# Patient Record
Sex: Female | Born: 1950 | Race: White | Hispanic: No | Marital: Married | Smoking: Former smoker
Health system: Southern US, Community
[De-identification: ages and names within clinical notes are randomized; demographics above are authoritative.]

## PROBLEM LIST (undated history)

## (undated) DIAGNOSIS — F419 Anxiety disorder, unspecified: Secondary | ICD-10-CM

## (undated) DIAGNOSIS — I10 Essential (primary) hypertension: Secondary | ICD-10-CM

## (undated) DIAGNOSIS — Z8659 Personal history of other mental and behavioral disorders: Secondary | ICD-10-CM

## (undated) DIAGNOSIS — K759 Inflammatory liver disease, unspecified: Secondary | ICD-10-CM

## (undated) DIAGNOSIS — J189 Pneumonia, unspecified organism: Secondary | ICD-10-CM

## (undated) DIAGNOSIS — G479 Sleep disorder, unspecified: Secondary | ICD-10-CM

## (undated) DIAGNOSIS — K769 Liver disease, unspecified: Secondary | ICD-10-CM

## (undated) DIAGNOSIS — L409 Psoriasis, unspecified: Secondary | ICD-10-CM

## (undated) DIAGNOSIS — J439 Emphysema, unspecified: Secondary | ICD-10-CM

## (undated) HISTORY — DX: Essential (primary) hypertension: I10

## (undated) HISTORY — PX: WISDOM TOOTH EXTRACTION: SHX21

## (undated) HISTORY — PX: ABDOMINAL HYSTERECTOMY: SHX81

---

## 1998-06-19 ENCOUNTER — Emergency Department (HOSPITAL_COMMUNITY): Admission: EM | Admit: 1998-06-19 | Discharge: 1998-06-19 | Payer: Self-pay | Admitting: Emergency Medicine

## 1998-06-19 ENCOUNTER — Encounter: Payer: Self-pay | Admitting: Emergency Medicine

## 1999-01-05 ENCOUNTER — Other Ambulatory Visit: Admission: RE | Admit: 1999-01-05 | Discharge: 1999-01-05 | Payer: Self-pay | Admitting: Family Medicine

## 2000-05-21 ENCOUNTER — Encounter: Admission: RE | Admit: 2000-05-21 | Discharge: 2000-05-23 | Payer: Self-pay | Admitting: *Deleted

## 2001-02-04 ENCOUNTER — Emergency Department (HOSPITAL_COMMUNITY): Admission: EM | Admit: 2001-02-04 | Discharge: 2001-02-04 | Payer: Self-pay | Admitting: Emergency Medicine

## 2004-11-11 ENCOUNTER — Ambulatory Visit: Payer: Self-pay | Admitting: Family Medicine

## 2004-11-17 ENCOUNTER — Encounter: Admission: RE | Admit: 2004-11-17 | Discharge: 2004-11-17 | Payer: Self-pay | Admitting: Family Medicine

## 2004-11-25 ENCOUNTER — Ambulatory Visit: Payer: Self-pay | Admitting: Family Medicine

## 2005-01-18 ENCOUNTER — Ambulatory Visit: Payer: Self-pay | Admitting: Gastroenterology

## 2005-04-29 ENCOUNTER — Ambulatory Visit: Payer: Self-pay | Admitting: Family Medicine

## 2007-08-30 HISTORY — PX: COLONOSCOPY: SHX174

## 2011-03-30 HISTORY — PX: LUNG LOBECTOMY: SHX167

## 2013-08-19 ENCOUNTER — Encounter: Payer: Self-pay | Admitting: Obstetrics and Gynecology

## 2013-08-19 ENCOUNTER — Ambulatory Visit (INDEPENDENT_AMBULATORY_CARE_PROVIDER_SITE_OTHER): Payer: BC Managed Care – PPO | Admitting: Obstetrics and Gynecology

## 2013-08-19 VITALS — BP 144/100 | Ht 61.5 in | Wt 200.8 lb

## 2013-08-19 DIAGNOSIS — N898 Other specified noninflammatory disorders of vagina: Secondary | ICD-10-CM

## 2013-08-19 LAB — POCT WET PREP (WET MOUNT): Trichomonas Wet Prep HPF POC: NEGATIVE

## 2013-08-19 MED ORDER — ESTROGENS, CONJUGATED 0.625 MG/GM VA CREA: 1.0000 | TOPICAL_CREAM | Freq: Every day | VAGINAL | Status: DC

## 2013-08-19 MED ORDER — CLOBETASOL PROPIONATE 0.05 % EX SOLN: 1.0000 "application " | Freq: Two times a day (BID) | CUTANEOUS | Status: DC

## 2013-08-19 NOTE — Progress Notes (Signed)
Patient ID: Raven Lee, female   DOB: 07-01-1951, 62 y.o.   MRN: [id number]   [facility] Visit  Patient name: Raven Lee MRN [id number]  Date of birth: 09/20/1950  CC & HPI:  Raven Lee is a 62 y.o. female presenting today for recurrent Body odor tx'd with Temovated.. Ist visit in EPIC/[facility]    ROS:  Hx vulvar bx for + lichen sclerosis  Pertinent History Reviewed:  Medical & Surgical Hx:  Reviewed: Significant for hyst yrs ago Medications: Reviewed & Updated - see associated section Social History: Reviewed -  reports that she has quit smoking. She has never used smokeless tobacco.  Objective Findings:  Vitals: BP 144/100  Ht 5' 1.5" (1.562 m)  Wt 200 lb 12.8 oz (91.082 kg)  BMI 37.33 kg/m2  Physical Examination: General appearance - alert, well appearing, and in no distress, oriented to person, place, and time and overweight Abdomen - soft, nontender, nondistended, no masses or organomegaly Pelvic - vulvar excoriation LS&A Vag  Atrophic   Assessment & Plan:   LS&A Atrophic vaginitis P renew temovate,  Rx premarinVC 2x/wk

## 2014-07-04 ENCOUNTER — Encounter: Payer: Self-pay | Admitting: Internal Medicine

## 2014-07-04 ENCOUNTER — Ambulatory Visit (INDEPENDENT_AMBULATORY_CARE_PROVIDER_SITE_OTHER): Payer: BC Managed Care – PPO | Admitting: Internal Medicine

## 2014-07-04 ENCOUNTER — Encounter (INDEPENDENT_AMBULATORY_CARE_PROVIDER_SITE_OTHER): Payer: Self-pay

## 2014-07-04 ENCOUNTER — Other Ambulatory Visit (INDEPENDENT_AMBULATORY_CARE_PROVIDER_SITE_OTHER): Payer: BC Managed Care – PPO

## 2014-07-04 VITALS — BP 150/90 | HR 73 | Ht 63.0 in | Wt 210.0 lb

## 2014-07-04 DIAGNOSIS — J449 Chronic obstructive pulmonary disease, unspecified: Secondary | ICD-10-CM

## 2014-07-04 DIAGNOSIS — R0600 Dyspnea, unspecified: Secondary | ICD-10-CM

## 2014-07-04 LAB — CBC WITH DIFFERENTIAL/PLATELET
BASOS ABS: 0.1 10*3/uL (ref 0.0–0.1)
Basophils Relative: 1.8 % (ref 0.0–3.0)
EOS ABS: 0 10*3/uL (ref 0.0–0.7)
Eosinophils Relative: 0 % (ref 0.0–5.0)
HCT: 43.9 % (ref 36.0–46.0)
Hemoglobin: 14.8 g/dL (ref 12.0–15.0)
Lymphocytes Relative: 40 % (ref 12.0–46.0)
Lymphs Abs: 2.4 10*3/uL (ref 0.7–4.0)
MCHC: 33.7 g/dL (ref 30.0–36.0)
MCV: 94.7 fl (ref 78.0–100.0)
Monocytes Absolute: 0.5 10*3/uL (ref 0.1–1.0)
Monocytes Relative: 8.6 % (ref 3.0–12.0)
NEUTROS ABS: 3 10*3/uL (ref 1.4–7.7)
Neutrophils Relative %: 49.6 % (ref 43.0–77.0)
Platelets: 174 10*3/uL (ref 150.0–400.0)
RBC: 4.63 Mil/uL (ref 3.87–5.11)
RDW: 14.6 % (ref 11.5–15.5)
WBC: 6.1 10*3/uL (ref 4.0–10.5)

## 2014-07-04 LAB — BASIC METABOLIC PANEL
BUN: 9 mg/dL (ref 6–23)
CHLORIDE: 105 meq/L (ref 96–112)
CO2: 25 mEq/L (ref 19–32)
Calcium: 9.2 mg/dL (ref 8.4–10.5)
Creatinine, Ser: 0.9 mg/dL (ref 0.4–1.2)
GFR: 68.21 mL/min (ref >60.00)
Glucose, Bld: 104 mg/dL — ABNORMAL HIGH (ref 70–99)
POTASSIUM: 3.6 meq/L (ref 3.5–5.1)
SODIUM: 141 meq/L (ref 135–145)

## 2014-07-04 LAB — TSH: TSH: 2.42 u[IU]/mL (ref 0.35–4.50)

## 2014-07-04 LAB — BRAIN NATRIURETIC PEPTIDE: Pro B Natriuretic peptide (BNP): 51 pg/mL (ref 0.0–100.0)

## 2014-07-04 NOTE — Patient Instructions (Addendum)
Ok to try allegra or clariton for your allergies  If you start having cough or throat congestion/ wheezing the first step is to change your quinapril to an alternative  Please remember to go to the lab and x-ray department downstairs for your tests - we will call you with the results when they are available.  We will be referring you to pulmonary rehab at [facility] - someone should contact you next week for this

## 2014-07-04 NOTE — Progress Notes (Signed)
Quick Note:  Spoke with pt and notified of results per Dr. Wert. Pt verbalized understanding and denied any questions.  ______ 

## 2014-07-04 NOTE — Progress Notes (Signed)
Subjective:    Patient ID: Raven Lee, female    DOB: 08-21-1951,   MRN: [id number]  HPI  70 yowf quit 2012 p LLLobectomy for pna at [facility] with persistant sob did not require 02 and inhalers but not using them seeing primary doc in [employer] and interested in refereral to rehab at [facility] so self referred 07/04/2014 to pulmonary clinic.  07/04/2014 1st [place] [facility] office visit/ Wert   Chief Complaint  Patient presents with  . Pulmonary Consult    Self referral. Pt states dxed with COPD in 2012. She had left lower lobectomy in Aug 2012 to remove "mass of pneumonia".  She c/o SOB since then.  She gets SOB with walking from her house to her barn.     indolent onset doe x years as  gained 70 lbs with gradual decline in ex tol to point where can still walk up to 5 min slow and flat but no inclines  No am cough  Chronic leg swellling R > L eval in [place] with neg dopplers in 2014 Some nasal congestion / stuffiness year round chronically but no throat complaints on chronic acei Has not tried inhalers  In term of doe, No obvious day to day or daytime variabilty or assoc chronic cough or cp or chest tightness, subjective wheeze or overt   hb symptoms. No unusual exp hx or h/o childhood pna/ asthma or knowledge of premature birth.  Sleeping ok without nocturnal  or early am exacerbation  of respiratory  c/o's or need for noct saba. Also denies any obvious fluctuation of symptoms with weather or environmental changes or other aggravating or alleviating factors except as outlined above   Current Medications, Allergies, Complete Past Medical History, Past Surgical History, Family History, and Social History were reviewed in [employer] record.             Review of Systems  Constitutional: Negative for fever, chills and unexpected weight change.  HENT: Positive for congestion. Negative for dental problem, ear pain, nosebleeds, postnasal drip, rhinorrhea, sinus pressure,  sneezing, sore throat, trouble swallowing and voice change.   Eyes: Negative for visual disturbance.  Respiratory: Negative for cough, choking and shortness of breath.   Cardiovascular: Positive for leg swelling. Negative for chest pain.  Gastrointestinal: Negative for vomiting, abdominal pain and diarrhea.  Genitourinary: Negative for difficulty urinating.  Musculoskeletal: Positive for arthralgias.  Neurological: Negative for tremors, syncope and headaches.  Hematological: Does not bruise/bleed easily.       Objective:   Physical Exam  amb obese wf nad  Wt Readings from Last 3 Encounters:  07/04/14 210 lb (95.255 kg)  08/19/13 200 lb 12.8 oz (91.082 kg)    Vital signs reviewed  HEENT: nl dentition, turbinates, and orophanx. Nl external ear canals without cough reflex   NECK :  without JVD/Nodes/TM/ nl carotid upstrokes bilaterally   LUNGS: no acc muscle use, clear to A and P bilaterally without cough on insp or exp maneuvers   CV:  RRR  no s3 or murmur or increase in P2, no edema   ABD:  soft and nontender with nl excursion in the supine position. No bruits or organomegaly, bowel sounds nl  MS:  warm without deformities, calf tenderness, cyanosis or clubbing  SKIN: warm and dry without lesions    NEURO:  alert, approp, no deficits    CXR  07/04/2014 : Did not go     Recent Labs Lab 07/04/14 1159  NA 141  K 3.6  CL 105  CO2 25  BUN 9  CREATININE 0.9  GLUCOSE 104*    Recent Labs Lab 07/04/14 1159  HGB 14.8  HCT 43.9  WBC 6.1  PLT 174.0     Lab Results  Component Value Date   TSH 2.42 07/04/2014     Lab Results  Component Value Date   PROBNP 51.0 07/04/2014        Assessment & Plan:

## 2014-07-05 DIAGNOSIS — J449 Chronic obstructive pulmonary disease, unspecified: Secondary | ICD-10-CM | POA: Insufficient documentation

## 2014-07-05 NOTE — Assessment & Plan Note (Signed)
C/w  Obesity/ s/p R LLobectomy, and mild copd  With bnp << 100 unlikely to have cardiac component and no further w/u needed at this point other than to complete a cxr which as requested but not done.

## 2014-07-05 NOTE — Assessment & Plan Note (Addendum)
-   spirometry 07/04/14 FEV1  1.24 (54%) ratio 64  - Referred to rehab at [facility] 07/04/14    When respiratory symptoms begin or become refractory well after a patient reports complete smoking cessation,  Especially when this wasn't the case while they were smoking, a red flag is raised based on the work of Dr Kris Mouton which states:  if you quit smoking when your best day FEV1 is still well preserved it is highly unlikely you will progress to severe disease.  That is to say, once the smoking stops,  the symptoms should not suddenly erupt or markedly worsen.  If so, the differential diagnosis should include  obesity/deconditioning,  LPR/Reflux/Aspiration syndromes,  occult CHF, or  especially side effect of medications commonly used in this population, like ACEi  Most likely this is result of obesity/ deconditioning over time though I would have very low threshold to d/c ACEi and rec she go ahead with rehab at this point  At this point the airflow obst is so mild and chronic s variation that  I don't believe using bronchodilators will help

## 2014-07-08 ENCOUNTER — Telehealth: Payer: Self-pay | Admitting: *Deleted

## 2014-07-08 NOTE — Telephone Encounter (Signed)
-----   Message from Tanda Rockers, MD sent at 07/05/2014  7:30 AM EST ----- Apparently she did not go for cxr which will need to be done before rehab

## 2014-07-08 NOTE — Telephone Encounter (Signed)
Pt returning call.Raven Lee ° °

## 2014-07-08 NOTE — Telephone Encounter (Signed)
LMTCB

## 2014-07-09 NOTE — Telephone Encounter (Signed)
[phone] returning call

## 2014-07-10 NOTE — Telephone Encounter (Signed)
Called and spoke with the pt and notified needs to come back for cxr  She verbalized understanding  Will call with results once received

## 2014-07-14 ENCOUNTER — Ambulatory Visit (INDEPENDENT_AMBULATORY_CARE_PROVIDER_SITE_OTHER)
Admission: RE | Admit: 2014-07-14 | Discharge: 2014-07-14 | Disposition: A | Discharge status: Home / Self Care | Payer: BC Managed Care – PPO | Source: Ambulatory Visit | Attending: Internal Medicine | Admitting: Internal Medicine

## 2014-07-14 DIAGNOSIS — R0600 Dyspnea, unspecified: Secondary | ICD-10-CM

## 2014-07-15 ENCOUNTER — Telehealth: Payer: Self-pay | Admitting: Internal Medicine

## 2014-07-15 NOTE — Progress Notes (Signed)
Quick Note:  LMTCB ______ 

## 2014-07-15 NOTE — Progress Notes (Signed)
Quick Note:  Spoke with pt and notified of results per Dr. Wert. Pt verbalized understanding and denied any questions.  ______ 

## 2014-07-15 NOTE — Telephone Encounter (Signed)
lmtcb for pt.   Notes Recorded by Tanda Rockers, MD on 07/14/2014 at 7:17 PM Call pt: Reviewed cxr and no acute change so no change in recommendations made at [facility]

## 2014-07-16 NOTE — Telephone Encounter (Signed)
Called spoke with patient, advised of cxr results/recs as stated by MW.  Pt voiced her understanding.  Nothing further needed; will sign off.

## 2014-08-07 ENCOUNTER — Encounter (HOSPITAL_COMMUNITY): Payer: BC Managed Care – PPO

## 2014-08-14 ENCOUNTER — Encounter (HOSPITAL_COMMUNITY): Payer: Self-pay

## 2014-08-14 ENCOUNTER — Encounter (HOSPITAL_COMMUNITY)
Admission: RE | Admit: 2014-08-14 | Discharge: 2014-08-14 | Disposition: A | Discharge status: Home / Self Care | Payer: BLUE CROSS/BLUE SHIELD | Source: Ambulatory Visit | Attending: Internal Medicine | Admitting: Internal Medicine

## 2014-08-14 VITALS — BP 128/82 | HR 71 | Ht 61.0 in | Wt 205.7 lb

## 2014-08-14 DIAGNOSIS — J438 Other emphysema: Secondary | ICD-10-CM

## 2014-08-14 DIAGNOSIS — J439 Emphysema, unspecified: Secondary | ICD-10-CM | POA: Insufficient documentation

## 2014-08-14 HISTORY — DX: Emphysema, unspecified: J43.9

## 2014-08-14 NOTE — Progress Notes (Signed)
Pt has finished orientation and is scheduled to start CR on 08/26/14 at 1330. Pt has been instructed to arrive to class 15 minutes early for scheduled class. Pt has been instructed to wear comfortable clothing and shoes with rubber soles. Pt has been told to take their medications 1 hour prior to coming to class.  If the patient is not going to attend class, he/she has been instructed to call.

## 2014-08-14 NOTE — Progress Notes (Signed)
Patient referred to Pulmonary Rehab by Dr. 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  Dr. Melvyn Novas is her Pulmonologist and Dr. Narda Bonds is her PCP.  During orientation advised patient on arrival and appointment times what to wear, what to do before, during and after exercise.  Reviewed attendance and class policy.  Talked about inclement weather and class consultation policy. Patient is scheduled to start Pulmonary Rehab on 08/26/14 at 1330. Patient was advised to come to class 5 minutes before class starts.  He was also given instructions on meeting with the dietician and attending the Family Structure classes. Pt is eager to get started. Pt was able to complete six minute walk test.  Discussed and reviewed pursed lip breathing technique and inhaler use and pt was able to correctly demonstrate.

## 2014-08-26 ENCOUNTER — Encounter (HOSPITAL_COMMUNITY)
Admission: RE | Admit: 2014-08-26 | Discharge: 2014-08-26 | Disposition: A | Discharge status: Home / Self Care | Payer: BLUE CROSS/BLUE SHIELD | Source: Ambulatory Visit | Attending: Internal Medicine | Admitting: Internal Medicine

## 2014-08-26 DIAGNOSIS — J439 Emphysema, unspecified: Secondary | ICD-10-CM | POA: Diagnosis not present

## 2014-08-28 ENCOUNTER — Encounter (HOSPITAL_COMMUNITY)
Admission: RE | Admit: 2014-08-28 | Discharge: 2014-08-28 | Disposition: A | Discharge status: Home / Self Care | Payer: BLUE CROSS/BLUE SHIELD | Source: Ambulatory Visit | Attending: Internal Medicine | Admitting: Internal Medicine

## 2014-08-28 DIAGNOSIS — J439 Emphysema, unspecified: Secondary | ICD-10-CM | POA: Diagnosis not present

## 2014-08-28 NOTE — Progress Notes (Signed)
Pulmonary Rehabilitation Program Outcomes Report   Orientation:  08/14/14 Graduate Date:  tbd Discharge Date:  tbd # of sessions completed: 2  Pulmonologist: Dr. Melvyn Novas Family MD:  Dr. Sharon Seller Time:  1330  A.  Exercise Program:  Tolerates exercise @ 3.63 METS for 30 minutes  B.  Mental Health:  Good mental attitude  C.  Education/Instruction/Skills  Accurately checks own pulse.  Rest:  79  Exercise:  91, Knows THR for exercise and Uses Perceived Exertion Scale and/or Dyspnea Scale  Uses Perceived Exertion Scale and/or Dyspnea Scale  D.  Nutrition/Weight Control/Body Composition:  Adherence to prescribed nutrition program: good    E.  Blood Lipids   No results found for: CHOL, HDL, LDLCALC, LDLDIRECT, TRIG, CHOLHDL  F.  Lifestyle Changes:  Making positive lifestyle changes and Not smoking:  Quit 2012  G.  Symptoms noted with exercise:  Asymptomatic  Report Completed By:  Felicity Coyer, RN   Comments:  First week progress note.  Pt is exhibiting a very positive attitude during first week.

## 2014-09-02 ENCOUNTER — Encounter (HOSPITAL_COMMUNITY)
Admission: RE | Admit: 2014-09-02 | Discharge: 2014-09-02 | Disposition: A | Discharge status: Home / Self Care | Payer: BLUE CROSS/BLUE SHIELD | Source: Ambulatory Visit | Attending: Internal Medicine | Admitting: Internal Medicine

## 2014-09-02 DIAGNOSIS — J439 Emphysema, unspecified: Secondary | ICD-10-CM | POA: Diagnosis present

## 2014-09-03 ENCOUNTER — Other Ambulatory Visit: Payer: BC Managed Care – PPO | Admitting: Obstetrics and Gynecology

## 2014-09-04 ENCOUNTER — Encounter (HOSPITAL_COMMUNITY): Payer: BLUE CROSS/BLUE SHIELD

## 2014-09-09 ENCOUNTER — Encounter (HOSPITAL_COMMUNITY)
Admission: RE | Admit: 2014-09-09 | Discharge: 2014-09-09 | Disposition: A | Discharge status: Home / Self Care | Payer: BLUE CROSS/BLUE SHIELD | Source: Ambulatory Visit | Attending: Internal Medicine | Admitting: Internal Medicine

## 2014-09-09 DIAGNOSIS — J439 Emphysema, unspecified: Secondary | ICD-10-CM | POA: Diagnosis not present

## 2014-09-11 ENCOUNTER — Encounter (HOSPITAL_COMMUNITY)
Admission: RE | Admit: 2014-09-11 | Discharge: 2014-09-11 | Disposition: A | Discharge status: Home / Self Care | Payer: BLUE CROSS/BLUE SHIELD | Source: Ambulatory Visit | Attending: Internal Medicine | Admitting: Internal Medicine

## 2014-09-11 DIAGNOSIS — J439 Emphysema, unspecified: Secondary | ICD-10-CM | POA: Diagnosis not present

## 2014-09-12 ENCOUNTER — Other Ambulatory Visit: Payer: Self-pay | Admitting: General Surgery

## 2014-09-13 LAB — COMPREHENSIVE METABOLIC PANEL
ALBUMIN: 3.5 g/dL (ref 3.5–5.2)
ALT: 61 U/L — ABNORMAL HIGH (ref 0–35)
AST: 122 U/L — AB (ref 0–37)
Alkaline Phosphatase: 97 U/L (ref 39–117)
BUN: 9 mg/dL (ref 6–23)
CO2: 22 mEq/L (ref 19–32)
Calcium: 9.3 mg/dL (ref 8.4–10.5)
Chloride: 103 mEq/L (ref 96–112)
Creat: 0.7 mg/dL (ref 0.50–1.10)
Glucose, Bld: 105 mg/dL — ABNORMAL HIGH (ref 70–99)
POTASSIUM: 3.4 meq/L — AB (ref 3.5–5.3)
Sodium: 142 mEq/L (ref 135–145)
Total Bilirubin: 1.3 mg/dL — ABNORMAL HIGH (ref 0.2–1.2)
Total Protein: 7.9 g/dL (ref 6.0–8.3)

## 2014-09-13 LAB — CBC WITH DIFFERENTIAL/PLATELET
Basophils Absolute: 0 10*3/uL (ref 0.0–0.1)
Basophils Relative: 0 % (ref 0–1)
Eosinophils Absolute: 0 10*3/uL (ref 0.0–0.7)
Eosinophils Relative: 0 % (ref 0–5)
HEMATOCRIT: 41.8 % (ref 36.0–46.0)
Hemoglobin: 14.2 g/dL (ref 12.0–15.0)
LYMPHS PCT: 36 % (ref 12–46)
Lymphs Abs: 1.9 10*3/uL (ref 0.7–4.0)
MCH: 31.7 pg (ref 26.0–34.0)
MCHC: 34 g/dL (ref 30.0–36.0)
MCV: 93.3 fL (ref 78.0–100.0)
MONO ABS: 0.6 10*3/uL (ref 0.1–1.0)
MPV: 9.6 fL (ref 8.6–12.4)
Monocytes Relative: 11 % (ref 3–12)
NEUTROS ABS: 2.9 10*3/uL (ref 1.7–7.7)
Neutrophils Relative %: 53 % (ref 43–77)
PLATELETS: 148 10*3/uL — AB (ref 150–400)
RBC: 4.48 MIL/uL (ref 3.87–5.11)
RDW: 14.9 % (ref 11.5–15.5)
WBC: 5.4 10*3/uL (ref 4.0–10.5)

## 2014-09-13 LAB — URINALYSIS, ROUTINE W REFLEX MICROSCOPIC
Glucose, UA: NEGATIVE mg/dL
Hgb urine dipstick: NEGATIVE
NITRITE: NEGATIVE
PH: 6 (ref 5.0–8.0)
Protein, ur: 30 mg/dL — AB
Specific Gravity, Urine: 1.03 (ref 1.005–1.030)
Urobilinogen, UA: 1 mg/dL (ref 0.0–1.0)

## 2014-09-13 LAB — URINALYSIS, MICROSCOPIC ONLY
CASTS: NONE SEEN
CRYSTALS: NONE SEEN

## 2014-09-13 LAB — LIPID PANEL
Cholesterol: 154 mg/dL (ref 0–200)
HDL: 41 mg/dL (ref >39)
LDL CALC: 84 mg/dL (ref 0–99)
TRIGLYCERIDES: 146 mg/dL (ref <150)
Total CHOL/HDL Ratio: 3.8 Ratio
VLDL: 29 mg/dL (ref 0–40)

## 2014-09-13 LAB — FOLATE: Folate: 18.8 ng/mL

## 2014-09-13 LAB — VITAMIN B12: Vitamin B-12: 510 pg/mL (ref 211–911)

## 2014-09-13 LAB — TSH: TSH: 3.086 u[IU]/mL (ref 0.350–4.500)

## 2014-09-13 LAB — IRON: IRON: 252 ug/dL — AB (ref 42–145)

## 2014-09-13 LAB — T4, FREE: FREE T4: 0.82 ng/dL (ref 0.80–1.80)

## 2014-09-15 ENCOUNTER — Other Ambulatory Visit (INDEPENDENT_AMBULATORY_CARE_PROVIDER_SITE_OTHER): Payer: Self-pay

## 2014-09-16 ENCOUNTER — Encounter (HOSPITAL_COMMUNITY)
Admission: RE | Admit: 2014-09-16 | Discharge: 2014-09-16 | Disposition: A | Discharge status: Home / Self Care | Payer: BLUE CROSS/BLUE SHIELD | Source: Ambulatory Visit | Attending: Internal Medicine | Admitting: Internal Medicine

## 2014-09-16 DIAGNOSIS — J439 Emphysema, unspecified: Secondary | ICD-10-CM | POA: Diagnosis not present

## 2014-09-17 LAB — VITAMIN D 1,25 DIHYDROXY
VITAMIN D 1, 25 (OH) TOTAL: 64 pg/mL (ref 18–72)
Vitamin D2 1, 25 (OH)2: <8 pg/mL
Vitamin D3 1, 25 (OH)2: 64 pg/mL

## 2014-09-18 ENCOUNTER — Encounter (HOSPITAL_COMMUNITY): Payer: BLUE CROSS/BLUE SHIELD

## 2014-09-23 ENCOUNTER — Encounter (HOSPITAL_COMMUNITY)
Admission: RE | Admit: 2014-09-23 | Discharge: 2014-09-23 | Disposition: A | Discharge status: Home / Self Care | Payer: BLUE CROSS/BLUE SHIELD | Source: Ambulatory Visit | Attending: Internal Medicine | Admitting: Internal Medicine

## 2014-09-23 DIAGNOSIS — J439 Emphysema, unspecified: Secondary | ICD-10-CM | POA: Diagnosis not present

## 2014-09-25 ENCOUNTER — Encounter (HOSPITAL_COMMUNITY): Payer: BLUE CROSS/BLUE SHIELD

## 2014-09-30 ENCOUNTER — Encounter (HOSPITAL_COMMUNITY): Payer: BLUE CROSS/BLUE SHIELD

## 2014-10-01 ENCOUNTER — Other Ambulatory Visit (INDEPENDENT_AMBULATORY_CARE_PROVIDER_SITE_OTHER): Payer: Self-pay

## 2014-10-02 ENCOUNTER — Encounter (HOSPITAL_COMMUNITY)
Admission: RE | Admit: 2014-10-02 | Discharge: 2014-10-02 | Disposition: A | Discharge status: Home / Self Care | Payer: BLUE CROSS/BLUE SHIELD | Source: Ambulatory Visit | Attending: Internal Medicine | Admitting: Internal Medicine

## 2014-10-02 DIAGNOSIS — J439 Emphysema, unspecified: Secondary | ICD-10-CM | POA: Diagnosis present

## 2014-10-04 ENCOUNTER — Encounter: Payer: BLUE CROSS/BLUE SHIELD | Attending: General Surgery | Admitting: Dietician

## 2014-10-04 ENCOUNTER — Encounter: Payer: Self-pay | Admitting: Dietician

## 2014-10-04 DIAGNOSIS — Z6841 Body Mass Index (BMI) 40.0 and over, adult: Secondary | ICD-10-CM | POA: Diagnosis not present

## 2014-10-04 DIAGNOSIS — Z713 Dietary counseling and surveillance: Secondary | ICD-10-CM | POA: Insufficient documentation

## 2014-10-04 NOTE — Patient Instructions (Signed)

## 2014-10-04 NOTE — Progress Notes (Signed)
  Pre-Op Assessment Visit:  Pre-Operative RYGB Surgery  Medical Nutrition Therapy:  Appt start time: 0350   End time:  0938.  Patient was seen on 10/04/2014 for Pre-Operative Nutrition Assessment. Assessment and letter of approval faxed to [facility] Bariatric Surgery Program coordinator on 10/04/2014.   Preferred Learning Style:   No preference indicated   Learning Readiness:   Ready  Handouts given during visit include:  Pre-Op Goals Bariatric Surgery Protein Shakes   During the appointment today the following Pre-Op Goals were reviewed with the patient: Maintain or lose weight as instructed by your surgeon Make healthy food choices Begin to limit portion sizes Limited concentrated sugars and fried foods Keep fat/sugar in the single digits per serving on   food labels Practice CHEWING your food  (aim for 30 chews per bite or until applesauce consistency) Practice not drinking 15 minutes before, during, and 30 minutes after each meal/snack Avoid all carbonated beverages  Avoid/limit caffeinated beverages  Avoid all sugar-sweetened beverages Consume 3 meals per day; eat every 3-5 hours Make a list of non-food related activities Aim for 64-100 ounces of FLUID daily  Aim for at least 60-80 grams of PROTEIN daily Look for a liquid protein source that contain ?15 g protein and ?5 g carbohydrate  (ex: shakes, drinks, shots)  Patient-Centered Goals: Raven Lee would like to live a longer life and would like to be more active. Raven Lee feels 100% confident that she can do the pre op goals and feel they are very important.  Demonstrated degree of understanding via:  Teach Back  Teaching Method Utilized:  Visual Auditory Hands on  Barriers to learning/adherence to lifestyle change: none  Patient to call the [facility] to enroll in Pre-Op and Post-Op Nutrition Education when surgery date is scheduled.

## 2014-10-07 ENCOUNTER — Encounter (HOSPITAL_COMMUNITY)
Admission: RE | Admit: 2014-10-07 | Discharge: 2014-10-07 | Disposition: A | Discharge status: Home / Self Care | Payer: BLUE CROSS/BLUE SHIELD | Source: Ambulatory Visit | Attending: Internal Medicine | Admitting: Internal Medicine

## 2014-10-07 DIAGNOSIS — J439 Emphysema, unspecified: Secondary | ICD-10-CM | POA: Diagnosis not present

## 2014-10-09 ENCOUNTER — Encounter (HOSPITAL_COMMUNITY): Payer: BLUE CROSS/BLUE SHIELD

## 2014-10-14 ENCOUNTER — Encounter (HOSPITAL_COMMUNITY): Payer: BLUE CROSS/BLUE SHIELD

## 2014-10-16 ENCOUNTER — Encounter (HOSPITAL_COMMUNITY): Payer: BLUE CROSS/BLUE SHIELD

## 2014-10-20 ENCOUNTER — Ambulatory Visit (HOSPITAL_COMMUNITY)
Admission: RE | Admit: 2014-10-20 | Discharge: 2014-10-20 | Disposition: A | Discharge status: Home / Self Care | Payer: BLUE CROSS/BLUE SHIELD | Source: Ambulatory Visit | Attending: General Surgery | Admitting: General Surgery

## 2014-10-20 DIAGNOSIS — Z87891 Personal history of nicotine dependence: Secondary | ICD-10-CM | POA: Insufficient documentation

## 2014-10-20 DIAGNOSIS — J449 Chronic obstructive pulmonary disease, unspecified: Secondary | ICD-10-CM | POA: Insufficient documentation

## 2014-10-20 DIAGNOSIS — Z6841 Body Mass Index (BMI) 40.0 and over, adult: Secondary | ICD-10-CM | POA: Diagnosis not present

## 2014-10-20 DIAGNOSIS — K224 Dyskinesia of esophagus: Secondary | ICD-10-CM | POA: Diagnosis not present

## 2014-10-21 ENCOUNTER — Encounter (HOSPITAL_COMMUNITY): Payer: BLUE CROSS/BLUE SHIELD

## 2014-10-23 ENCOUNTER — Encounter (HOSPITAL_COMMUNITY): Payer: BLUE CROSS/BLUE SHIELD

## 2014-10-23 NOTE — Progress Notes (Signed)
Patient is discharged from [facility] Pulmonary program today, October 23, 2014 with 9 sessions after calling stating she will not be returning to Pulmonary Rehab due to a change of living location.  She achieved LTG of 30 minutes of aerobic exercise at max met level of 3.65.  All patient vitals are WNL.   Patient had no complaints of any abnormal S/S or pain on their exit visit.

## 2014-10-28 ENCOUNTER — Encounter (HOSPITAL_COMMUNITY): Payer: BLUE CROSS/BLUE SHIELD

## 2014-10-30 ENCOUNTER — Encounter (HOSPITAL_COMMUNITY): Payer: BLUE CROSS/BLUE SHIELD

## 2014-11-04 ENCOUNTER — Encounter (HOSPITAL_COMMUNITY): Payer: BLUE CROSS/BLUE SHIELD

## 2014-11-06 ENCOUNTER — Encounter (HOSPITAL_COMMUNITY): Payer: BLUE CROSS/BLUE SHIELD

## 2014-11-10 ENCOUNTER — Encounter: Payer: BLUE CROSS/BLUE SHIELD | Attending: General Surgery

## 2014-11-10 DIAGNOSIS — Z6841 Body Mass Index (BMI) 40.0 and over, adult: Secondary | ICD-10-CM | POA: Insufficient documentation

## 2014-11-10 DIAGNOSIS — Z713 Dietary counseling and surveillance: Secondary | ICD-10-CM | POA: Insufficient documentation

## 2014-11-11 ENCOUNTER — Encounter (HOSPITAL_COMMUNITY): Payer: BLUE CROSS/BLUE SHIELD

## 2014-11-11 NOTE — Progress Notes (Signed)
  Pre-Operative Nutrition Class:  Appt start time: 1624   End time:  1830.  Patient was seen on 11/10/14 for Pre-Operative Bariatric Surgery Education at the [facility].   Surgery date:  Surgery type: RYGB Start weight at [facility]: 214 lbs on 10/04/14 Weight today: 201.5 lbs  TANITA  BODY COMP RESULTS  11/10/14   BMI (kg/m^2) 39.8   Fat Mass (lbs) 102   Fat Free Mass (lbs) 108.5   Total Body Water (lbs) 79.5   Samples given per MNT protocol. Patient educated on appropriate usage: Premier protein shake (strawberry - qty 1) Lot #: [id number] Exp: 04/2015  Unjury protein powder (unflavored - qty 1) Lot #: [id number] Exp: 09/2015  Bariatric Advantage Calcium citrate chews (orange - qty 1) Lot #: [id number] Exp: 12/2014  PB2 (qty 1) Lot #: [id number] Exp: 03/2015  The following the learning objectives were met by the patient during this course:  Identify Pre-Op Dietary Goals and will begin 2 weeks pre-operatively  Identify appropriate sources of fluids and proteins   State protein recommendations and appropriate sources pre and post-operatively  Identify Post-Operative Dietary Goals and will follow for 2 weeks post-operatively  Identify appropriate multivitamin and calcium sources  Describe the need for physical activity post-operatively and will follow MD recommendations  State when to call healthcare provider regarding medication questions or post-operative complications  Handouts given during class include:  Pre-Op Bariatric Surgery Diet Handout  Protein Shake Handout  Post-Op Bariatric Surgery Nutrition Handout  BELT Program Information Flyer  Support Group Information Flyer  [facility] Bariatric Supplements Price List  Follow-Up Plan: Patient will follow-up at [facility] 2 weeks post operatively for diet advancement per MD.

## 2014-11-13 ENCOUNTER — Encounter (HOSPITAL_COMMUNITY): Payer: BLUE CROSS/BLUE SHIELD

## 2014-11-19 ENCOUNTER — Other Ambulatory Visit: Payer: Self-pay | Admitting: General Surgery

## 2014-11-20 ENCOUNTER — Other Ambulatory Visit: Payer: Self-pay | Admitting: General Surgery

## 2014-11-20 NOTE — Progress Notes (Addendum)
Please put order in Epic for obtain consent surgery 12-01-14 pre op 11-26-14  Thanks

## 2014-11-25 NOTE — Patient Instructions (Addendum)
Raven Lee  11/25/2014   Your procedure is scheduled on: 12/01/14   Report to [facility] Main  Entrance and follow signs to               [place] at 8:30 AM.   Call this number if you have problems the morning of surgery [phone]   Remember:  Do not eat food or drink liquids :After Midnight.    Take these medicines the morning of surgery with A SIP OF WATER: NONE                                You may not have any metal on your body including hair pins and              piercings  Do not wear jewelry, make-up, lotions, powders or perfumes.             Do not wear nail polish.  Do not shave  48 hours prior to surgery.              Men may shave face and neck.   Do not bring valuables to the hospital. [place].  Contacts, dentures or bridgework may not be worn into surgery.  Leave suitcase in the car. After surgery it may be brought to your room.     Patients discharged the day of surgery will not be allowed to drive home.  Name and phone number of your driver:  Special Instructions: N/A              Please read over the following fact sheets you were given: _____________________________________________________________________                                                     [place]  Before surgery, you can play an important role.  Because skin is not sterile, your skin needs to be as free of germs as possible.  You can reduce the number of germs on your skin by washing with CHG (chlorahexidine gluconate) soap before surgery.  CHG is an antiseptic cleaner which kills germs and bonds with the skin to continue killing germs even after washing. Please DO NOT use if you have an allergy to CHG or antibacterial soaps.  If your skin becomes reddened/irritated stop using the CHG and inform your nurse when you arrive at Short Stay. Do not shave (including legs and  underarms) for at least 48 hours prior to the first CHG shower.  You may shave your face. Please follow these instructions carefully:   1.  Shower with CHG Soap the night before surgery and the  morning of Surgery.   2.  If you choose to wash your hair, wash your hair first as usual with your  normal  Shampoo.   3.  After you shampoo, rinse your hair and body thoroughly to remove the  shampoo.  4.  Use CHG as you would any other liquid soap.  You can apply chg directly  to the skin and wash . Gently wash with scrungie or clean wascloth    5.  Apply the CHG Soap to your body ONLY FROM THE NECK DOWN.   Do not use on open                           Wound or open sores. Avoid contact with eyes, ears mouth and genitals (private parts).                        Genitals (private parts) with your normal soap.              6.  Wash thoroughly, paying special attention to the area where your surgery  will be performed.   7.  Thoroughly rinse your body with warm water from the neck down.   8.  DO NOT shower/wash with your normal soap after using and rinsing off  the CHG Soap .                9.  Pat yourself dry with a clean towel.             10.  Wear clean pajamas.             11.  Place clean sheets on your bed the night of your first shower and do not  sleep with pets.  Day of Surgery : Do not apply any lotions/deodorants the morning of surgery.  Please wear clean clothes to the hospital/surgery center.  FAILURE TO FOLLOW THESE INSTRUCTIONS MAY RESULT IN THE CANCELLATION OF YOUR SURGERY    PATIENT SIGNATURE_________________________________  ______________________________________________________________________

## 2014-11-26 ENCOUNTER — Encounter (HOSPITAL_COMMUNITY)
Admission: RE | Admit: 2014-11-26 | Discharge: 2014-11-26 | Disposition: A | Discharge status: Home / Self Care | Payer: BLUE CROSS/BLUE SHIELD | Source: Ambulatory Visit | Attending: General Surgery | Admitting: General Surgery

## 2014-11-26 ENCOUNTER — Other Ambulatory Visit: Payer: Self-pay

## 2014-11-26 ENCOUNTER — Encounter (HOSPITAL_COMMUNITY): Payer: Self-pay

## 2014-11-26 DIAGNOSIS — Z01812 Encounter for preprocedural laboratory examination: Secondary | ICD-10-CM | POA: Insufficient documentation

## 2014-11-26 DIAGNOSIS — Z01810 Encounter for preprocedural cardiovascular examination: Secondary | ICD-10-CM | POA: Insufficient documentation

## 2014-11-26 HISTORY — DX: Inflammatory liver disease, unspecified: K75.9

## 2014-11-26 HISTORY — DX: Psoriasis, unspecified: L40.9

## 2014-11-26 HISTORY — DX: Sleep disorder, unspecified: G47.9

## 2014-11-26 HISTORY — DX: Personal history of other mental and behavioral disorders: Z86.59

## 2014-11-26 LAB — COMPREHENSIVE METABOLIC PANEL
ALT: 89 U/L — ABNORMAL HIGH (ref 0–35)
AST: 149 U/L — ABNORMAL HIGH (ref 0–37)
Albumin: 3.7 g/dL (ref 3.5–5.2)
Alkaline Phosphatase: 84 U/L (ref 39–117)
Anion gap: 6 (ref 5–15)
BUN: 9 mg/dL (ref 6–23)
CO2: 30 mmol/L (ref 19–32)
Calcium: 8.9 mg/dL (ref 8.4–10.5)
Chloride: 105 mmol/L (ref 96–112)
Creatinine, Ser: 0.68 mg/dL (ref 0.50–1.10)
Glucose, Bld: 101 mg/dL — ABNORMAL HIGH (ref 70–99)
POTASSIUM: 3.7 mmol/L (ref 3.5–5.1)
Sodium: 141 mmol/L (ref 135–145)
Total Bilirubin: 1 mg/dL (ref 0.3–1.2)
Total Protein: 8.3 g/dL (ref 6.0–8.3)

## 2014-11-26 LAB — CBC WITH DIFFERENTIAL/PLATELET
Basophils Absolute: 0 10*3/uL (ref 0.0–0.1)
Basophils Relative: 0 % (ref 0–1)
EOS ABS: 0 10*3/uL (ref 0.0–0.7)
Eosinophils Relative: 0 % (ref 0–5)
HEMATOCRIT: 42.4 % (ref 36.0–46.0)
Hemoglobin: 14.3 g/dL (ref 12.0–15.0)
Lymphocytes Relative: 38 % (ref 12–46)
Lymphs Abs: 2.1 10*3/uL (ref 0.7–4.0)
MCH: 32.5 pg (ref 26.0–34.0)
MCHC: 33.7 g/dL (ref 30.0–36.0)
MCV: 96.4 fL (ref 78.0–100.0)
MONO ABS: 0.6 10*3/uL (ref 0.1–1.0)
MONOS PCT: 10 % (ref 3–12)
NEUTROS ABS: 2.9 10*3/uL (ref 1.7–7.7)
Neutrophils Relative %: 52 % (ref 43–77)
Platelets: 145 10*3/uL — ABNORMAL LOW (ref 150–400)
RBC: 4.4 MIL/uL (ref 3.87–5.11)
RDW: 13.7 % (ref 11.5–15.5)
WBC: 5.5 10*3/uL (ref 4.0–10.5)

## 2014-11-26 NOTE — Progress Notes (Signed)
   11/26/14 1206  OBSTRUCTIVE SLEEP APNEA  Have you ever been diagnosed with sleep apnea through a sleep study? No  Do you snore loudly (loud enough to be heard through closed doors)?  1  Do you often feel tired, fatigued, or sleepy during the daytime? 0  Has anyone observed you stop breathing during your sleep? 0  Do you have, or are you being treated for high blood pressure? 1  BMI more than 35 kg/m2? 1  Age over 63 years old? 1  Neck circumference greater than 40 cm/16 inches? 0  Gender: 0

## 2014-12-01 ENCOUNTER — Inpatient Hospital Stay (HOSPITAL_COMMUNITY): Payer: BLUE CROSS/BLUE SHIELD | Admitting: Anesthesiology

## 2014-12-01 ENCOUNTER — Inpatient Hospital Stay (HOSPITAL_COMMUNITY)
Admission: RE | Admit: 2014-12-01 | Discharge: 2014-12-03 | DRG: 621 | Disposition: A | Discharge status: Home / Self Care | Payer: BLUE CROSS/BLUE SHIELD | Source: Ambulatory Visit | Attending: General Surgery | Admitting: General Surgery

## 2014-12-01 ENCOUNTER — Encounter (HOSPITAL_COMMUNITY): Payer: Self-pay | Admitting: *Deleted

## 2014-12-01 ENCOUNTER — Encounter (HOSPITAL_COMMUNITY)
Admission: RE | Disposition: A | Discharge status: Home / Self Care | Payer: Self-pay | Source: Ambulatory Visit | Attending: General Surgery

## 2014-12-01 DIAGNOSIS — Z8249 Family history of ischemic heart disease and other diseases of the circulatory system: Secondary | ICD-10-CM

## 2014-12-01 DIAGNOSIS — Z6841 Body Mass Index (BMI) 40.0 and over, adult: Secondary | ICD-10-CM

## 2014-12-01 DIAGNOSIS — Z809 Family history of malignant neoplasm, unspecified: Secondary | ICD-10-CM | POA: Diagnosis not present

## 2014-12-01 DIAGNOSIS — Z79899 Other long term (current) drug therapy: Secondary | ICD-10-CM | POA: Diagnosis not present

## 2014-12-01 DIAGNOSIS — F17211 Nicotine dependence, cigarettes, in remission: Secondary | ICD-10-CM | POA: Diagnosis present

## 2014-12-01 DIAGNOSIS — J449 Chronic obstructive pulmonary disease, unspecified: Secondary | ICD-10-CM | POA: Diagnosis present

## 2014-12-01 DIAGNOSIS — Z9884 Bariatric surgery status: Secondary | ICD-10-CM

## 2014-12-01 DIAGNOSIS — B182 Chronic viral hepatitis C: Secondary | ICD-10-CM | POA: Diagnosis present

## 2014-12-01 DIAGNOSIS — I10 Essential (primary) hypertension: Secondary | ICD-10-CM | POA: Diagnosis present

## 2014-12-01 DIAGNOSIS — Z823 Family history of stroke: Secondary | ICD-10-CM | POA: Diagnosis not present

## 2014-12-01 DIAGNOSIS — Z01812 Encounter for preprocedural laboratory examination: Secondary | ICD-10-CM

## 2014-12-01 HISTORY — PX: LAPAROSCOPIC GASTRIC SLEEVE RESECTION: SHX5895

## 2014-12-01 LAB — HEMOGLOBIN AND HEMATOCRIT, BLOOD
HCT: 43.5 % (ref 36.0–46.0)
HEMOGLOBIN: 14.7 g/dL (ref 12.0–15.0)

## 2014-12-01 SURGERY — GASTRECTOMY, SLEEVE, LAPAROSCOPIC
Anesthesia: General | Site: Abdomen

## 2014-12-01 MED ORDER — UNJURY VANILLA POWDER: 2.0000 [oz_av] | Freq: Four times a day (QID) | ORAL | Status: DC

## 2014-12-01 MED ORDER — DEXAMETHASONE SODIUM PHOSPHATE 10 MG/ML IJ SOLN
INTRAMUSCULAR | Status: AC
  Filled 2014-12-01: qty 1

## 2014-12-01 MED ORDER — METOPROLOL TARTRATE 1 MG/ML IV SOLN
5.0000 mg | Freq: Four times a day (QID) | INTRAVENOUS | Status: DC
  Administered 2014-12-01 – 2014-12-03 (×8): 5 mg via INTRAVENOUS
  Filled 2014-12-01 (×11): qty 5

## 2014-12-01 MED ORDER — LACTATED RINGERS IV SOLN
INTRAVENOUS | Status: DC
  Administered 2014-12-01: 13:00:00 via INTRAVENOUS

## 2014-12-01 MED ORDER — DEXAMETHASONE SODIUM PHOSPHATE 10 MG/ML IJ SOLN
INTRAMUSCULAR | Status: DC | PRN
  Administered 2014-12-01: 10 mg via INTRAVENOUS

## 2014-12-01 MED ORDER — ONDANSETRON HCL 4 MG/2ML IJ SOLN
INTRAMUSCULAR | Status: AC
  Filled 2014-12-01: qty 2

## 2014-12-01 MED ORDER — PROPOFOL 10 MG/ML IV BOLUS
INTRAVENOUS | Status: DC | PRN
  Administered 2014-12-01: 150 mg via INTRAVENOUS

## 2014-12-01 MED ORDER — CHLORHEXIDINE GLUCONATE 0.12 % MT SOLN
15.0000 mL | Freq: Two times a day (BID) | OROMUCOSAL | Status: DC
  Administered 2014-12-01 – 2014-12-02 (×4): 15 mL via OROMUCOSAL
  Filled 2014-12-01 (×7): qty 15

## 2014-12-01 MED ORDER — SUCCINYLCHOLINE CHLORIDE 20 MG/ML IJ SOLN
INTRAMUSCULAR | Status: DC | PRN
  Administered 2014-12-01: 100 mg via INTRAVENOUS

## 2014-12-01 MED ORDER — MORPHINE SULFATE 2 MG/ML IJ SOLN
2.0000 mg | INTRAMUSCULAR | Status: DC | PRN
  Administered 2014-12-01 (×2): 2 mg via INTRAVENOUS
  Filled 2014-12-01 (×2): qty 1

## 2014-12-01 MED ORDER — DEXTROSE 5 % IV SOLN
2.0000 g | INTRAVENOUS | Status: AC
  Administered 2014-12-01: 2 g via INTRAVENOUS

## 2014-12-01 MED ORDER — NEOSTIGMINE METHYLSULFATE 10 MG/10ML IV SOLN
INTRAVENOUS | Status: AC
  Filled 2014-12-01: qty 1

## 2014-12-01 MED ORDER — EPHEDRINE SULFATE 50 MG/ML IJ SOLN
INTRAMUSCULAR | Status: DC | PRN
  Administered 2014-12-01 (×2): 10 mg via INTRAVENOUS

## 2014-12-01 MED ORDER — CHLORHEXIDINE GLUCONATE CLOTH 2 % EX PADS: 6.0000 | MEDICATED_PAD | Freq: Once | CUTANEOUS | Status: DC

## 2014-12-01 MED ORDER — HEPARIN SODIUM (PORCINE) 5000 UNIT/ML IJ SOLN
5000.0000 [IU] | INTRAMUSCULAR | Status: AC
  Administered 2014-12-01: 5000 [IU] via SUBCUTANEOUS
  Filled 2014-12-01: qty 1

## 2014-12-01 MED ORDER — CISATRACURIUM BESYLATE 20 MG/10ML IV SOLN
INTRAVENOUS | Status: AC
  Filled 2014-12-01: qty 10

## 2014-12-01 MED ORDER — PROPOFOL 10 MG/ML IV BOLUS
INTRAVENOUS | Status: AC
  Filled 2014-12-01: qty 20

## 2014-12-01 MED ORDER — NEOSTIGMINE METHYLSULFATE 10 MG/10ML IV SOLN
INTRAVENOUS | Status: DC | PRN
  Administered 2014-12-01: 4 mg via INTRAVENOUS

## 2014-12-01 MED ORDER — LABETALOL HCL 5 MG/ML IV SOLN
5.0000 mg | INTRAVENOUS | Status: DC | PRN
  Administered 2014-12-01 (×2): 5 mg via INTRAVENOUS

## 2014-12-01 MED ORDER — FENTANYL CITRATE 0.05 MG/ML IJ SOLN
INTRAMUSCULAR | Status: DC | PRN
  Administered 2014-12-01 (×2): 50 ug via INTRAVENOUS
  Administered 2014-12-01: 100 ug via INTRAVENOUS

## 2014-12-01 MED ORDER — MIDAZOLAM HCL 5 MG/5ML IJ SOLN
INTRAMUSCULAR | Status: DC | PRN
  Administered 2014-12-01: 2 mg via INTRAVENOUS

## 2014-12-01 MED ORDER — HYDROMORPHONE HCL 1 MG/ML IJ SOLN: 0.2500 mg | INTRAMUSCULAR | Status: DC | PRN

## 2014-12-01 MED ORDER — CEFOXITIN SODIUM 2 G IV SOLR
INTRAVENOUS | Status: AC
  Filled 2014-12-01: qty 2

## 2014-12-01 MED ORDER — BUPIVACAINE-EPINEPHRINE 0.25% -1:200000 IJ SOLN
INTRAMUSCULAR | Status: AC
  Filled 2014-12-01: qty 1

## 2014-12-01 MED ORDER — METOPROLOL TARTRATE 1 MG/ML IV SOLN
INTRAVENOUS | Status: AC
  Filled 2014-12-01: qty 5

## 2014-12-01 MED ORDER — LACTATED RINGERS IV SOLN
INTRAVENOUS | Status: DC
  Administered 2014-12-01: 1000 mL via INTRAVENOUS

## 2014-12-01 MED ORDER — SODIUM CHLORIDE 0.9 % IJ SOLN
INTRAMUSCULAR | Status: AC
  Filled 2014-12-01: qty 10

## 2014-12-01 MED ORDER — ONDANSETRON HCL 4 MG/2ML IJ SOLN
4.0000 mg | INTRAMUSCULAR | Status: DC | PRN
  Administered 2014-12-02: 4 mg via INTRAVENOUS
  Filled 2014-12-01: qty 2

## 2014-12-01 MED ORDER — PROMETHAZINE HCL 25 MG/ML IJ SOLN
INTRAMUSCULAR | Status: AC
  Filled 2014-12-01: qty 1

## 2014-12-01 MED ORDER — EPHEDRINE SULFATE 50 MG/ML IJ SOLN
INTRAMUSCULAR | Status: AC
  Filled 2014-12-01: qty 1

## 2014-12-01 MED ORDER — GLYCOPYRROLATE 0.2 MG/ML IJ SOLN
INTRAMUSCULAR | Status: AC
  Filled 2014-12-01: qty 3

## 2014-12-01 MED ORDER — CETYLPYRIDINIUM CHLORIDE 0.05 % MT LIQD
7.0000 mL | Freq: Two times a day (BID) | OROMUCOSAL | Status: DC
  Administered 2014-12-01 – 2014-12-02 (×3): 7 mL via OROMUCOSAL

## 2014-12-01 MED ORDER — PROMETHAZINE HCL 25 MG/ML IJ SOLN
12.5000 mg | INTRAMUSCULAR | Status: DC | PRN
  Administered 2014-12-01: 12.5 mg via INTRAVENOUS

## 2014-12-01 MED ORDER — UNJURY CHICKEN SOUP POWDER
2.0000 [oz_av] | Freq: Four times a day (QID) | ORAL | Status: DC
  Administered 2014-12-03: 2 [oz_av] via ORAL

## 2014-12-01 MED ORDER — ACETAMINOPHEN 160 MG/5ML PO SOLN
650.0000 mg | ORAL | Status: DC | PRN
  Administered 2014-12-02: 650 mg via ORAL
  Filled 2014-12-01: qty 20.3

## 2014-12-01 MED ORDER — METOPROLOL TARTRATE 50 MG PO TABS
50.0000 mg | ORAL_TABLET | Freq: Two times a day (BID) | ORAL | Status: DC
  Administered 2014-12-01: 50 mg via ORAL
  Filled 2014-12-01 (×4): qty 1

## 2014-12-01 MED ORDER — GLYCOPYRROLATE 0.2 MG/ML IJ SOLN
INTRAMUSCULAR | Status: DC | PRN
  Administered 2014-12-01: 0.6 mg via INTRAVENOUS

## 2014-12-01 MED ORDER — ONDANSETRON HCL 4 MG/2ML IJ SOLN
INTRAMUSCULAR | Status: DC | PRN
  Administered 2014-12-01: 4 mg via INTRAVENOUS

## 2014-12-01 MED ORDER — TISSEEL VH 10 ML EX KIT
PACK | CUTANEOUS | Status: AC
  Filled 2014-12-01: qty 1

## 2014-12-01 MED ORDER — MIDAZOLAM HCL 2 MG/2ML IJ SOLN
INTRAMUSCULAR | Status: AC
  Filled 2014-12-01: qty 2

## 2014-12-01 MED ORDER — UNJURY CHOCOLATE CLASSIC POWDER
2.0000 [oz_av] | Freq: Four times a day (QID) | ORAL | Status: DC
Start: 2014-12-03 — End: 2014-12-03

## 2014-12-01 MED ORDER — LABETALOL HCL 5 MG/ML IV SOLN
INTRAVENOUS | Status: AC
  Filled 2014-12-01: qty 4

## 2014-12-01 MED ORDER — FENTANYL CITRATE 0.05 MG/ML IJ SOLN
INTRAMUSCULAR | Status: AC
  Filled 2014-12-01: qty 5

## 2014-12-01 MED ORDER — HEPARIN SODIUM (PORCINE) 5000 UNIT/ML IJ SOLN
5000.0000 [IU] | Freq: Three times a day (TID) | INTRAMUSCULAR | Status: DC
  Administered 2014-12-01 – 2014-12-03 (×5): 5000 [IU] via SUBCUTANEOUS
  Filled 2014-12-01 (×8): qty 1

## 2014-12-01 MED ORDER — KCL IN DEXTROSE-NACL 20-5-0.9 MEQ/L-%-% IV SOLN
INTRAVENOUS | Status: DC
  Administered 2014-12-01 – 2014-12-02 (×3): via INTRAVENOUS
  Administered 2014-12-02 – 2014-12-03 (×2): 1000 mL via INTRAVENOUS
  Filled 2014-12-01 (×6): qty 1000

## 2014-12-01 MED ORDER — CISATRACURIUM BESYLATE (PF) 10 MG/5ML IV SOLN
INTRAVENOUS | Status: DC | PRN
  Administered 2014-12-01: 6 mg via INTRAVENOUS
  Administered 2014-12-01 (×2): 4 mg via INTRAVENOUS

## 2014-12-01 SURGICAL SUPPLY — 58 items
APL SRG 32X5 SNPLK LF DISP (MISCELLANEOUS) ×1
APPLICATOR COTTON TIP 6IN STRL (MISCELLANEOUS) IMPLANT
APPLIER CLIP ROT 10 11.4 M/L (STAPLE)
APPLIER CLIP ROT 13.4 12 LRG (CLIP)
APR CLP LRG 13.4X12 ROT 20 MLT (CLIP)
APR CLP MED LRG 11.4X10 (STAPLE)
BAG SPEC RTRVL LRG 6X4 10 (ENDOMECHANICALS)
BLADE SURG SZ11 CARB STEEL (BLADE) ×2 IMPLANT
CABLE HIGH FREQUENCY MONO STRZ (ELECTRODE) IMPLANT
CHLORAPREP W/TINT 26ML (MISCELLANEOUS) ×2 IMPLANT
CLIP APPLIE ROT 10 11.4 M/L (STAPLE) IMPLANT
CLIP APPLIE ROT 13.4 12 LRG (CLIP) IMPLANT
DEVICE SUTURE ENDOST 10MM (ENDOMECHANICALS) IMPLANT
DEVICE TROCAR PUNCTURE CLOSURE (ENDOMECHANICALS) ×2 IMPLANT
DRAPE CAMERA CLOSED 9X96 (DRAPES) ×2 IMPLANT
DRAPE UTILITY XL STRL (DRAPES) ×4 IMPLANT
ELECT REM PT RETURN 9FT ADLT (ELECTROSURGICAL) ×2
ELECTRODE REM PT RTRN 9FT ADLT (ELECTROSURGICAL) ×1 IMPLANT
GAUZE SPONGE 4X4 12PLY STRL (GAUZE/BANDAGES/DRESSINGS) IMPLANT
GLOVE BIOGEL PI IND STRL 7.5 (GLOVE) ×1 IMPLANT
GLOVE BIOGEL PI INDICATOR 7.5 (GLOVE) ×2
GLOVE ECLIPSE 7.5 STRL STRAW (GLOVE) ×2 IMPLANT
GOWN STRL REUS W/TWL XL LVL3 (GOWN DISPOSABLE) ×8 IMPLANT
HOVERMATT SINGLE USE (MISCELLANEOUS) ×2 IMPLANT
KIT BASIN OR (CUSTOM PROCEDURE TRAY) ×2 IMPLANT
LIQUID BAND (GAUZE/BANDAGES/DRESSINGS) ×2 IMPLANT
NDL SPNL 22GX3.5 QUINCKE BK (NEEDLE) ×1 IMPLANT
NEEDLE SPNL 22GX3.5 QUINCKE BK (NEEDLE) ×2 IMPLANT
PACK UNIVERSAL I (CUSTOM PROCEDURE TRAY) ×2 IMPLANT
PEN SKIN MARKING BROAD (MISCELLANEOUS) ×2 IMPLANT
POUCH SPECIMEN RETRIEVAL 10MM (ENDOMECHANICALS) IMPLANT
RELOAD STAPLE 60 3.6 BLU REG (STAPLE) ×1 IMPLANT
RELOAD STAPLE 60 3.8 GOLD REG (STAPLE) ×1 IMPLANT
RELOAD STAPLE 60 4.1 GRN THCK (STAPLE) ×1 IMPLANT
RELOAD STAPLER BLUE 60MM (STAPLE) ×1 IMPLANT
RELOAD STAPLER GOLD 60MM (STAPLE) ×2 IMPLANT
RELOAD STAPLER GREEN 60MM (STAPLE) ×2 IMPLANT
SCISSORS LAP 5X35 DISP (ENDOMECHANICALS) ×1 IMPLANT
SEALANT SURGICAL APPL DUAL CAN (MISCELLANEOUS) ×2 IMPLANT
SET IRRIG TUBING LAPAROSCOPIC (IRRIGATION / IRRIGATOR) ×2 IMPLANT
SHEARS CURVED HARMONIC AC 45CM (MISCELLANEOUS) ×2 IMPLANT
SLEEVE ADV FIXATION 5X100MM (TROCAR) ×2 IMPLANT
SLEEVE GASTRECTOMY 36FR VISIGI (MISCELLANEOUS) ×2 IMPLANT
SOLUTION ANTI FOG 6CC (MISCELLANEOUS) ×2 IMPLANT
SPONGE LAP 18X18 X RAY DECT (DISPOSABLE) ×2 IMPLANT
STAPLER ECHELON LONG 60 440 (INSTRUMENTS) ×3 IMPLANT
STAPLER RELOAD BLUE 60MM (STAPLE) ×2
STAPLER RELOAD GOLD 60MM (STAPLE) ×4
STAPLER RELOAD GREEN 60MM (STAPLE) ×4
SUT ETHILON 2 0 PS N (SUTURE) IMPLANT
SUT MNCRL AB 4-0 PS2 18 (SUTURE) ×2 IMPLANT
SYR 20CC LL (SYRINGE) ×2 IMPLANT
TOWEL OR NON WOVEN STRL DISP B (DISPOSABLE) ×2 IMPLANT
TROCAR ADV FIXATION 5X100MM (TROCAR) ×2 IMPLANT
TROCAR BLADELESS 15MM (ENDOMECHANICALS) ×2 IMPLANT
TUBING CONNECTING 10 (TUBING) ×2 IMPLANT
TUBING ENDO SMARTCAP PENTAX (MISCELLANEOUS) ×2 IMPLANT
TUBING FILTER THERMOFLATOR (ELECTROSURGICAL) ×2 IMPLANT

## 2014-12-01 NOTE — Transfer of Care (Signed)
Immediate Anesthesia Transfer of Care Note  Patient: Raven Lee  Procedure(s) Performed: Procedure(s): LAPAROSCOPIC GASTRIC SLEEVE RESECTION UPPER ENDO (N/A)  Patient Location: PACU  Anesthesia Type:General  Level of Consciousness: sedated and patient cooperative  Airway & Oxygen Therapy: Patient Spontanous Breathing and Patient connected to face mask oxygen  Post-op Assessment: Report given to RN and Post -op Vital signs reviewed and stable  Post vital signs: Reviewed and stable  Last Vitals:  Filed Vitals:   12/01/14 0803  BP: 153/68  Pulse: 70  Temp: 37.1 C  Resp: 18    Complications: No apparent anesthesia complications

## 2014-12-01 NOTE — Anesthesia Preprocedure Evaluation (Addendum)
Anesthesia Evaluation  Patient identified by MRN, date of birth, ID band Patient awake    Reviewed: Allergy & Precautions, H&P , NPO status , Patient's Chart, lab work & pertinent test results, reviewed documented beta blocker date and time   Airway Mallampati: II  TM Distance: >3 FB Neck ROM: full    Dental  (+) Teeth Intact, Dental Advisory Given   Pulmonary shortness of breath and with exertion, COPDformer smoker,  breath sounds clear to auscultation  Pulmonary exam normal       Cardiovascular Exercise Tolerance: Good hypertension, Pt. on home beta blockers and Pt. on medications Rhythm:regular Rate:Normal     Neuro/Psych Anxiety negative neurological ROS  negative psych ROS   GI/Hepatic negative GI ROS, (+) Hepatitis -, C  Endo/Other  negative endocrine ROSMorbid obesity  Renal/GU negative Renal ROS  negative genitourinary   Musculoskeletal   Abdominal (+) + obese,   Peds  Hematology negative hematology ROS (+)   Anesthesia Other Findings   Reproductive/Obstetrics negative OB ROS                            Anesthesia Physical Anesthesia Plan  ASA: III  Anesthesia Plan: General   Post-op Pain Management:    Induction: Intravenous  Airway Management Planned: Oral ETT  Additional Equipment:   Intra-op Plan:   Post-operative Plan: Extubation in OR  Informed Consent: I have reviewed the patients History and Physical, chart, labs and discussed the procedure including the risks, benefits and alternatives for the proposed anesthesia with the patient or authorized representative who has indicated his/her understanding and acceptance.   Dental Advisory Given  Plan Discussed with: CRNA and Surgeon  Anesthesia Plan Comments:         Anesthesia Quick Evaluation

## 2014-12-01 NOTE — Op Note (Signed)
Preoperative Diagnosis: MORBID OBESITY  Postoprative Diagnosis: MORBID OBESITY  Procedure: Procedure(s): LAPAROSCOPIC GASTRIC SLEEVE RESECTION UPPER ENDO   Surgeon: Excell Seltzer T   Assistants: Greer Pickerel  Anesthesia:  General endotracheal anesthesia  Indications: Patient is a 64 year old female with progressive morbid obesity unresponsive to medical management. Following an extensive workup and detailed discussion documented elsewhere we have elected to proceed with laparoscopic sleeve gastrectomy for treatment of her morbid obesity    Procedure Detail:  Patient was brought to the operating room, placed in supine position on the operating table, and general endotracheal anesthesia induced. She received preoperative IV antibiotics. PAS were in place. She received subcutaneous heparin preoperatively. The abdomen was widely sterilely prepped and draped. Patient timeout was performed and correct procedure verified. Access was obtained with a 5 mm Optiview trocar in the left upper quadrant without difficulty and pneumoperitoneum established. Under direct vision a 5 mm trocar was placed laterally in the right upper quadrant, a 15 mm trocar in the right abdomen at the base of the falciform ligament and a 5 mm trocar just to the left of the umbilicus for the camera port. The liver was noted to be enlarged and nodular consistent with early cirrhosis. Through a 5 mm subxiphoid port and the Nathanson retractor was placed in the left lobe of liver was elevated and were able to obtain very good exposure of the entire stomach and hiatus. There was no gross evidence of hiatal hernia and her preoperative workup was negative. An additional 5 mm trocar was placed laterally in the left abdomen. We measured 5 cm from the pylorus beginning along the greater curve the lesser omentum was dissected off the greater curve with the Harmonic scalpel and the lesser sac entered. The dissection continued along the greater  curve working proximally. Short gastric vessels were divided with Harmonic scalpel and the fundus was completely freed from the spleen. The left crus was thoroughly dissected and the stomach mobilized off of this. Some posterior filmy attachments were divided completely freeing the stomach to its lesser curve attachment. We further dissected a little distally to allow the stapler to be placed at 5 cm.  The VisiG tube was introduced orally and passed with its tip down to the pylorus. It was positioned along the lesser curve with the stomach held out symmetrically and suction applied fixing the tube in place.An Initial firing of the green load echelon 60 mm stapler was performed at 5 cm from the pylorus staying well away from the gastric tube.  A second firing of the echelon 60 mm green load stapler was performed staying just off the tube and caring the staple line pass the incisura. A gold load was then used staying just adjacent to the tube working proximally and 2 further blue load firings were performed staying just off the tube but the final firing angling out lateral to the fat pad. The VisiG G-tube was inflated and the sleeve appeared symmetrical and no evidence of leak under saline irrigation. The tube was removed. Dr. Redmond Pulling performed upper endoscopy showing no bleeding or stricture and again no evidence of leak with insufflation under saline irrigation. The abdomen was irrigated and complete hemostasis assured. Tisseel sealant was used to coat the staple line of the sleeve. The gastric specimen was removed through the 15 mm port after dilating it slightly and this port site was closed with a 0 Vicryl with the Endo Close. The Nathanson retractor was removed and all CO2 evacuated and trochars removed. Skin  incisions were closed with subcuticular Monocryl and Dermabond. Sponge needle and instrument counts were correct   Findings: As above  Estimated Blood Loss:  Minimal         Drains: none  Blood  Given: none          Specimens: Greater curvature of stomach        Complications:  * No complications entered in OR log *         Disposition: PACU - hemodynamically stable.         Condition: stable

## 2014-12-01 NOTE — H&P (Signed)
History of Present Illness Raven Kitchen T. Joelle Flessner MD; 11/19/2014 11:43 AM) The patient is a 64 year old female who presents with obesity. Patient returns for her preop visit prior to planned laparoscopic sleeve gastrectomy. She has successfully completed her preoperative workup. We reviewed diet and nutrition evaluations. Lab work was unremarkable except for some mildly elevated LFTs which are chronic for her. She has been feeling well without any intercurrent illness. She has started her preoperative diet and is excited about proceeding to surgery.   Other Problems Raven Jolly, MD; 11/19/2014 11:44 AM) High blood pressure Oophorectomy Bilateral. 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  Diagnostic Studies History Raven Jolly, MD; 11/19/2014 11:44 AM) Mammogram 1-3 years ago Pap Smear 1-5 years ago  Allergies Raven Lee, CMA; 11/19/2014 11:17 AM) OxyCODONE HCl *ANALGESICS - OPIOID*  Medication History Raven Lee, CMA; 11/19/2014 11:17 AM) Effexor XR (150MG  Capsule ER 24HR, Oral daily) Active. Hydrochlorothiazide (25MG  Tablet, Oral daily) Active. Metoprolol Tartrate (50MG  Tablet, Oral daily) Active. ProAir HFA (108 (90 Base)MCG/ACT Aerosol Soln, Inhalation as needed) Active. Spiriva HandiHaler (18MCG Capsule, Inhalation as needed) Active. Mirtazapine (30MG  Tablet, Oral daily) Active. Quinapril HCl (40MG  Tablet, Oral daily) Active. Metronidazole Vaginal Cream (as needed) Active. Premarin Vaginal Cream (as needed) Active. Medications Reconciled  Social History Raven Jolly, MD; 11/19/2014 11:44 AM) Alcohol use Occasional alcohol use. Caffeine use Coffee, Tea. No drug use Tobacco use Former smoker.  Family History Raven Jolly, MD; 11/19/2014 11:44 AM) Heart disease in female family member before age 68 Hypertension Brother, Father, Sister. Respiratory Condition Mother. Cancer Mother. Cerebrovascular Accident Father.  Pregnancy / Birth History Raven Jolly, MD; 11/19/2014 11:44 AM) Age at menarche 68 years. Age of menopause 5-55 Gravida 1 Para 1 Maternal age 42-20  Vitals Raven Lee CMA; 11/19/2014 11:17 AM) 11/19/2014 11:17 AM Weight: 212.8 lb Height: 61in Body Surface Area: 2.04 m Body Mass Index: 40.21 kg/m Temp.: 97.32F(Temporal)  Pulse: 83 (Regular)  Resp.: 17 (Unlabored)  BP: 128/62 (Sitting, Left Arm, Standard)    Physical Exam Raven Kitchen T. Tunisia Landgrebe MD; 11/19/2014 11:45 AM) The physical exam findings are as follows: Note:General: Alert, obese Caucasian female, in no distress Skin: Warm and dry without rash or infection. HEENT: No palpable masses or thyromegaly. Sclera nonicteric. Pupils equal round and reactive. Oropharynx clear. Lymph nodes: No cervical, supraclavicular, or inguinal nodes palpable. Lungs: Breath sounds clear and equal. No wheezing or increased work of breathing. Cardiovascular: Regular rate and rhythm without murmer. No JVD or edema. Peripheral pulses intact. No carotid bruits. Abdomen: Nondistended. Soft and nontender. No masses palpable. No organomegaly. No palpable hernias. Extremities: No edema or joint swelling or deformity. No chronic venous stasis changes. Neurologic: Alert and fully oriented. Gait normal. No focal weakness. Psychiatric: Normal mood and affect. Thought content appropriate with normal judgement and insight    Assessment & Plan Raven Kitchen T. Jassica Zazueta MD; 11/19/2014 11:47 AM) MORBID OBESITY (278.01  E66.01) Impression: Patient with progressive morbid obesity unresponsive to multiple efforts at medical management who presents with a BMI of 41 and comorbidities of obesity related pulmonary issues and COPD,  hypertension, elevated cholesterol. She has completed her preoperative workup and is ready to proceed with sleeve gastrectomy. We reviewed the procedure and risks again today and all her questions were answered. Current Plans  Schedule for Surgery Laparoscopic  sleeve gastrectomy

## 2014-12-01 NOTE — Anesthesia Postprocedure Evaluation (Signed)
  Anesthesia Post-op Note  Patient: Raven Lee  Procedure(s) Performed: Procedure(s) (LRB): LAPAROSCOPIC GASTRIC SLEEVE RESECTION UPPER ENDO (N/A)  Patient Location: PACU  Anesthesia Type: General  Level of Consciousness: awake and alert   Airway and Oxygen Therapy: Patient Spontanous Breathing  Post-op Pain: mild  Post-op Assessment: Post-op Vital signs reviewed, Patient's Cardiovascular Status Stable, Respiratory Function Stable, Patent Airway and No signs of Nausea or vomiting  Last Vitals:  Filed Vitals:   12/01/14 1235  BP: 170/75  Pulse: 79  Temp:   Resp: 11    Post-op Vital Signs: stable   Complications: No apparent anesthesia complications

## 2014-12-01 NOTE — Interval H&P Note (Signed)
History and Physical Interval Note:  12/01/2014 8:59 AM  Raven Lee  has presented today for surgery, with the diagnosis of MORBID OBESITY  The various methods of treatment have been discussed with the patient and family. After consideration of risks, benefits and other options for treatment, the patient has consented to  Procedure(s): LAPAROSCOPIC GASTRIC SLEEVE RESECTION UPPER ENDO (N/A) as a surgical intervention .  The patient's history has been reviewed, patient examined, no change in status, stable for surgery.  I have reviewed the patient's chart and labs.  Questions were answered to the patient's satisfaction.     Jhamir Pickup T

## 2014-12-01 NOTE — Op Note (Signed)
BENELLI WINTHER [id number] 04-Jul-1951 12/01/2014  Preoperative diagnosis: morbid obesity  Postoperative diagnosis: Same   Procedure: upper endoscopy   Surgeon: Leighton Ruff. Humza Tallerico M.D., FACS   Anesthesia: Gen.   Indications for procedure: 64 year old undergoing Laparoscopic Gastric Sleeve Resection and an EGD was requested to evaluate the new gastric sleeve.   Description of procedure: After we have completed the sleeve resection, I scrubbed out and obtained the Olympus endoscope. I gently placed endoscope in the patient's oropharynx and gently glided it down the esophagus without any difficulty under direct visualization. Once I was in the gastric sleeve, I insufflated the stomach with air. I was able to cannulate and advanced the scope through the gastric sleeve. I was able to cannulate the duodenum with ease. Dr. Excell Seltzer had placed saline in the upper abdomen. Upon further insufflation of the gastric sleeve there was no evidence of bubbles. GE junction located at 43 cm.  Upon further inspection of the gastric sleeve, the mucosa appeared normal. There is no evidence of any mucosal abnormality. The sleeve was widely patent at the angularis. There was no evidence of bleeding. The gastric sleeve was decompressed. The scope was withdrawn. The patient tolerated this portion of the procedure well. Please see Dr Lear Ng operative note for details regarding the laparoscopic gastric sleeve resection.   Leighton Ruff. Redmond Pulling, MD, FACS  General, Bariatric, & Minimally Invasive Surgery  [facility], [place]

## 2014-12-01 NOTE — Anesthesia Procedure Notes (Addendum)
Procedure Name: Intubation Date/Time: 12/01/2014 9:56 AM Performed by: Dione Booze Pre-anesthesia Checklist: Patient identified, Emergency Drugs available, Suction available and Patient being monitored Patient Re-evaluated:Patient Re-evaluated prior to inductionOxygen Delivery Method: Circle system utilized Preoxygenation: Pre-oxygenation with 100% oxygen Intubation Type: IV induction Laryngoscope Size: Mac and 4 Grade View: Grade II Tube type: Oral Number of attempts: 1 Airway Equipment and Method: Stylet Placement Confirmation: ETT inserted through vocal cords under direct vision,  breath sounds checked- equal and bilateral and positive ETCO2 Secured at: 21 cm Tube secured with: Tape Dental Injury: Teeth and Oropharynx as per pre-operative assessment  Comments: Cricoid needed to see cords. Small cut lt upper lip.

## 2014-12-02 ENCOUNTER — Inpatient Hospital Stay (HOSPITAL_COMMUNITY): Payer: BLUE CROSS/BLUE SHIELD

## 2014-12-02 LAB — CBC WITH DIFFERENTIAL/PLATELET
BASOS ABS: 0 10*3/uL (ref 0.0–0.1)
BASOS PCT: 0 % (ref 0–1)
Eosinophils Absolute: 0 10*3/uL (ref 0.0–0.7)
Eosinophils Relative: 0 % (ref 0–5)
HEMATOCRIT: 40.4 % (ref 36.0–46.0)
Hemoglobin: 13.2 g/dL (ref 12.0–15.0)
Lymphocytes Relative: 14 % (ref 12–46)
Lymphs Abs: 0.8 10*3/uL (ref 0.7–4.0)
MCH: 31.8 pg (ref 26.0–34.0)
MCHC: 32.7 g/dL (ref 30.0–36.0)
MCV: 97.3 fL (ref 78.0–100.0)
Monocytes Absolute: 0.3 10*3/uL (ref 0.1–1.0)
Monocytes Relative: 5 % (ref 3–12)
NEUTROS PCT: 81 % — AB (ref 43–77)
Neutro Abs: 4.9 10*3/uL (ref 1.7–7.7)
Platelets: 122 10*3/uL — ABNORMAL LOW (ref 150–400)
RBC: 4.15 MIL/uL (ref 3.87–5.11)
RDW: 13.7 % (ref 11.5–15.5)
WBC: 6 10*3/uL (ref 4.0–10.5)

## 2014-12-02 LAB — HEMOGLOBIN AND HEMATOCRIT, BLOOD
HCT: 42 % (ref 36.0–46.0)
Hemoglobin: 13.9 g/dL (ref 12.0–15.0)

## 2014-12-02 MED ORDER — METOPROLOL TARTRATE 50 MG PO TABS: 50.0000 mg | ORAL_TABLET | Freq: Two times a day (BID) | ORAL | Status: DC

## 2014-12-02 MED ORDER — IOHEXOL 300 MG/ML  SOLN
50.0000 mL | Freq: Once | INTRAMUSCULAR | Status: AC | PRN
  Administered 2014-12-02: 50 mL via ORAL

## 2014-12-02 MED ORDER — LISINOPRIL 40 MG PO TABS
40.0000 mg | ORAL_TABLET | Freq: Every evening | ORAL | Status: DC
  Administered 2014-12-02: 40 mg via ORAL
  Filled 2014-12-02 (×3): qty 1

## 2014-12-02 MED ORDER — QUINAPRIL HCL 10 MG PO TABS
40.0000 mg | ORAL_TABLET | Freq: Every evening | ORAL | Status: DC
  Filled 2014-12-02: qty 4

## 2014-12-02 MED ORDER — ZOLPIDEM TARTRATE 5 MG PO TABS
5.0000 mg | ORAL_TABLET | Freq: Every evening | ORAL | Status: DC | PRN
  Administered 2014-12-02: 5 mg via ORAL
  Filled 2014-12-02: qty 1

## 2014-12-02 MED ORDER — VENLAFAXINE HCL ER 150 MG PO CP24
150.0000 mg | ORAL_CAPSULE | Freq: Every evening | ORAL | Status: DC
  Administered 2014-12-02: 150 mg via ORAL
  Filled 2014-12-02 (×3): qty 1

## 2014-12-02 MED ORDER — HYDROCHLOROTHIAZIDE 25 MG PO TABS
25.0000 mg | ORAL_TABLET | Freq: Every evening | ORAL | Status: DC
  Administered 2014-12-02: 25 mg via ORAL
  Filled 2014-12-02 (×2): qty 1

## 2014-12-02 NOTE — Plan of Care (Signed)
Problem: Food- and Nutrition-Related Knowledge Deficit (NB-1.1) Goal: Nutrition education Formal process to instruct or train a patient/client in a skill or to impart knowledge to help patients/clients voluntarily manage or modify food choices and eating behavior to maintain or improve health. Outcome: Completed/Met Date Met:  12/02/14 Nutrition Education Note  Received consult for diet education per DROP protocol.   Discussed 2 week post op diet with pt. Emphasized that liquids must be non carbonated, non caffeinated, and sugar free. Fluid goals discussed. Pt to follow up with outpatient bariatric RD for further diet progression after 2 weeks. Multivitamins and minerals also reviewed. Teach back method used, pt expressed understanding, expect good compliance.   Diet: First 2 Weeks  You will see the nutritionist about two (2) weeks after your surgery. The nutritionist will increase the types of foods you can eat if you are handling liquids well:  If you have severe vomiting or nausea and cannot handle clear liquids lasting longer than 1 day, call your surgeon  Protein Shake  Drink at least 2 ounces of shake 5-6 times per day  Each serving of protein shakes (usually 8 - 12 ounces) should have a minimum of:  15 grams of protein  And no more than 5 grams of carbohydrate  Goal for protein each day:  Men = 80 grams per day  Women = 60 grams per day  Protein powder may be added to fluids such as non-fat milk or Lactaid milk or Soy milk (limit to 35 grams added protein powder per serving)   Hydration  Slowly increase the amount of water and other clear liquids as tolerated (See Acceptable Fluids)  Slowly increase the amount of protein shake as tolerated  Sip fluids slowly and throughout the day  May use sugar substitutes in small amounts (no more than 6 - 8 packets per day; i.e. Splenda)   Fluid Goal  The first goal is to drink at least 8 ounces of protein shake/drink per day (or as directed  by the nutritionist); some examples of protein shakes are [employer], [employer], EAS Edge HP, and Unjury. See handout from pre-op Bariatric Education Class:  Slowly increase the amount of protein shake you drink as tolerated  You may find it easier to slowly sip shakes throughout the day  It is important to get your proteins in first  Your fluid goal is to drink 64 - 100 ounces of fluid daily  It may take a few weeks to build up to this  32 oz (or more) should be clear liquids  And  32 oz (or more) should be full liquids (see below for examples)  Liquids should not contain sugar, caffeine, or carbonation   Clear Liquids:  Water or Sugar-free flavored water (i.e. Fruit H2O, Propel)  Decaffeinated coffee or tea (sugar-free)  Crystal Lite, Wyler's Lite, Minute Maid Lite  Sugar-free Jell-O  Bouillon or broth  Sugar-free Popsicle: *Less than 20 calories each; Limit 1 per day   Full Liquids:  Protein Shakes/Drinks + 2 choices per day of other full liquids  Full liquids must be:  No More Than 12 grams of Carbs per serving  No More Than 3 grams of Fat per serving  Strained low-fat cream soup  Non-Fat milk  Fat-free Lactaid Milk  Sugar-free yogurt (Dannon Lite & Fit, Greek yogurt)     Clayton Bibles, MS, RD, LDN Pager: [phone] After Hours Pager: [phone]

## 2014-12-02 NOTE — Progress Notes (Signed)
Patient alert and oriented, Post op day 1.  Provided support and encouragement.  Encouraged pulmonary toilet, ambulation and small sips of liquids when swallow study returned satisfactorily.  All questions answered.  Will continue to monitor. 

## 2014-12-02 NOTE — Progress Notes (Signed)
Patient ID: Raven Lee, female   DOB: 1950/12/15, 64 y.o.   MRN: [id number] 1 Day Post-Op  Subjective: Feels "great".  A little nausea last night now resolved.  Denies pain or other C/O  Objective: Vital signs in last 24 hours: Temp:  [97.7 F (36.5 C)-99 F (37.2 C)] 97.7 F (36.5 C) (04/05 0558) Pulse Rate:  [68-98] 76 (04/05 0558) Resp:  [9-20] 18 (04/05 0558) BP: (127-218)/(56-98) 152/76 mmHg (04/05 0558) SpO2:  [92 %-100 %] 92 % (04/05 0558) Last BM Date: 12/01/14  Intake/Output from previous day: 04/04 0701 - 04/05 0700 In: 2825 [I.V.:2825] Out: 1150 [Urine:1150] Intake/Output this shift:    General appearance: alert, cooperative and no distress GI: normal findings: soft, non-tender Incision/Wound: No drainage or erythema, moderate bruising R mid abd incision  Lab Results:   Recent Labs  12/01/14 1815 12/02/14 0520  WBC  --  6.0  HGB 14.7 13.2  HCT 43.5 40.4  PLT  --  122*   BMET No results for input(s): NA, K, CL, CO2, GLUCOSE, BUN, CREATININE, CALCIUM in the last 72 hours.   Studies/Results: No results found.  Anti-infectives: Anti-infectives    Start     Dose/Rate Route Frequency Ordered Stop   12/01/14 0808  cefOXitin (MEFOXIN) 2 g in dextrose 5 % 50 mL IVPB     2 g 100 mL/hr over 30 Minutes Intravenous On call to O.R. 12/01/14 0808 12/01/14 1003      Assessment/Plan: s/p Procedure(s): LAPAROSCOPIC GASTRIC SLEEVE RESECTION UPPER ENDO Doing well Swallow study pending   LOS: 1 day    Eleen Litz T 12/02/2014

## 2014-12-02 NOTE — Care Management Note (Signed)
    Page 1 of 1   12/02/2014     10:59:09 AM CARE MANAGEMENT NOTE 12/02/2014  Patient:  DEBBI, STRANDBERG   Account Number:  [id number]  Date Initiated:  12/02/2014  Documentation initiated by:  Sunday Spillers  Subjective/Objective Assessment:   64 yo female admitted s/p sleeve gastrectomy. PTA lived at home with spouse.     Action/Plan:   Home when stable   Anticipated DC Date:  12/04/2014   Anticipated DC Plan:  [place]  CM consult      Choice offered to / List presented to:             Status of service:  Completed, signed off Medicare Important Message given?   (If response is "NO", the following Medicare IM given date fields will be blank) Date Medicare IM given:   Medicare IM given by:   Date Additional Medicare IM given:   Additional Medicare IM given by:    Discharge Disposition:  HOME/SELF CARE  Per UR Regulation:  Reviewed for med. necessity/level of care/duration of stay  If discussed at [place] of Stay Meetings, dates discussed:    Comments:

## 2014-12-03 ENCOUNTER — Encounter (HOSPITAL_COMMUNITY): Payer: Self-pay | Admitting: General Surgery

## 2014-12-03 LAB — CBC WITH DIFFERENTIAL/PLATELET
BASOS PCT: 0 % (ref 0–1)
Basophils Absolute: 0 10*3/uL (ref 0.0–0.1)
EOS ABS: 0 10*3/uL (ref 0.0–0.7)
EOS PCT: 0 % (ref 0–5)
HCT: 40.3 % (ref 36.0–46.0)
Hemoglobin: 13.3 g/dL (ref 12.0–15.0)
LYMPHS ABS: 1.5 10*3/uL (ref 0.7–4.0)
Lymphocytes Relative: 16 % (ref 12–46)
MCH: 32.3 pg (ref 26.0–34.0)
MCHC: 33 g/dL (ref 30.0–36.0)
MCV: 97.8 fL (ref 78.0–100.0)
Monocytes Absolute: 0.6 10*3/uL (ref 0.1–1.0)
Monocytes Relative: 6 % (ref 3–12)
Neutro Abs: 7 10*3/uL (ref 1.7–7.7)
Neutrophils Relative %: 77 % (ref 43–77)
PLATELETS: 115 10*3/uL — AB (ref 150–400)
RBC: 4.12 MIL/uL (ref 3.87–5.11)
RDW: 14.1 % (ref 11.5–15.5)
WBC: 9.1 10*3/uL (ref 4.0–10.5)

## 2014-12-03 NOTE — Progress Notes (Signed)
Patient alert and oriented, pain is controlled. Patient is tolerating fluids,  advanced to protein shake today, patient tolerated well.  Reviewed Gastric sleeve discharge instructions with patient and patient is able to articulate understanding.  Provided information on BELT program, Support Group and [facility] outpatient pharmacy. All questions answered, will continue to monitor.  

## 2014-12-03 NOTE — Discharge Summary (Signed)
   Patient ID: Raven Lee [id number] 63 y.o. Oct 04, 1950  12/01/2014  Discharge date and time: 12/03/2014   Admitting Physician: Excell Seltzer T  Discharge Physician: Excell Seltzer T  Admission Diagnoses: MORBID OBESITY  Discharge Diagnoses: Same  Operations: Procedure(s): LAPAROSCOPIC GASTRIC SLEEVE RESECTION UPPER ENDO  Admission Condition: good  Discharged Condition: good  Indication for Admission: Patient is a 64 year old female with progressive morbid obesity unresponsive to medical management with comorbidities of joint pain and hypertension. After extensive preoperative workup and discussion detailed elsewhere she is electively admitted for laparoscopic sleeve gastrectomy for treatment of her morbid obesity.  Hospital Course: Patient underwent an uneventful laparoscopic sleeve gastrectomy on the morning of admission. On the first postoperative day she was stable with a normal CBC.  Gastrografin swallow showed no leak or obstruction and she was started on a clear liquid diet. She had no nausea and Denied any pain. On the second postoperative day she is feeling well without pain or nausea. Abdomen is soft and nontender. CBC is unremarkable. Vital signs normal. She is started on a bariatric full liquid diet with plans for discharge later today if tolerated well.   Disposition: Home  Patient Instructions:    Medication List    TAKE these medications        hydrochlorothiazide 25 MG tablet  Commonly known as:  HYDRODIURIL  Take 25 mg by mouth every evening.     Melatonin 3 MG Caps  Take 1 capsule by mouth daily.     metoprolol 50 MG tablet  Commonly known as:  LOPRESSOR  Take 50 mg by mouth 2 (two) times daily.     mirtazapine 30 MG tablet  Commonly known as:  REMERON  Take 30 mg by mouth at bedtime.     quinapril 40 MG tablet  Commonly known as:  ACCUPRIL  Take 40 mg by mouth every evening.     venlafaxine XR 150 MG 24 hr capsule  Commonly known as:   EFFEXOR-XR  Take 150 mg by mouth every evening.        Activity: activity as tolerated Diet: bariatric full liquid Wound Care: none needed  Follow-up:  With Dr. Excell Seltzer in 3 weeks.  Signed: Edward Jolly MD, FACS  12/03/2014, 7:54 AM

## 2014-12-03 NOTE — Discharge Instructions (Signed)

## 2014-12-05 ENCOUNTER — Telehealth (HOSPITAL_COMMUNITY): Payer: Self-pay

## 2014-12-05 NOTE — Telephone Encounter (Signed)
Made discharge phone call to patient per DROP protocol. Asking the following questions.    1. Do you have someone to care for you now that you are home?  yes 2. Are you having pain now that is not relieved by your pain medication?  no 3. Are you able to drink the recommended daily amount of fluids (48 ounces minimum/day) and protein (60-80 grams/day) as prescribed by the dietitian or nutritional counselor?  Yes, its tough but I am doing it! 4. Are you taking the vitamins and minerals as prescribed? yes   5. Do you have the "on call" number to contact your surgeon if you have a problem or question?  yes 6. Are your incisions free of redness, swelling or drainage? (If steri strips, address that these can fall off, shower as tolerated) yes 7. Have your bowels moved since your surgery?  If not, are you passing gas?  No, yes 8. Are you up and walking 3-4 times per day?  yes    1. Do you have an appointment made to see your surgeon in the next month?  yes 2. Were you provided your discharge medications before your surgery or before you were discharged from the hospital and are you taking them without problem?  yes 3. Were you provided phone numbers to the clinic/surgeon's office?  yes 4. Did you watch the patient education video module in the (clinic, surgeon's office, etc.) before your surgery? yes 5. Do you have a discharge checklist that was provided to you in the hospital to reference with instructions on how to take care of yourself after surgery?  yes 6. Did you see a dietitian or nutritional counselor while you were in the hospital?  yes 7. Do you have an appointment to see a dietitian or nutritional counselor in the next month? yes

## 2014-12-16 ENCOUNTER — Encounter: Payer: BLUE CROSS/BLUE SHIELD | Attending: General Surgery

## 2014-12-16 DIAGNOSIS — Z713 Dietary counseling and surveillance: Secondary | ICD-10-CM | POA: Diagnosis not present

## 2014-12-16 DIAGNOSIS — Z6841 Body Mass Index (BMI) 40.0 and over, adult: Secondary | ICD-10-CM | POA: Diagnosis not present

## 2014-12-16 NOTE — Progress Notes (Signed)
Bariatric Class:  Appt start time: 1530 end time:  1630.  2 Week Post-Operative Nutrition Class  Patient was seen on 12/16/2014 for Post-Operative Nutrition education at [facility].   Surgery date: 12/01/2014 Surgery type: Sleeve Gastrectomy Start weight at [facility]: 214 lbs on 10/04/14 Weight today: 200.0 lbs Weight change: 1.5 lbs  TANITA  BODY COMP RESULTS  11/10/14 12/16/14   BMI (kg/m^2) 39.8 37.8   Fat Mass (lbs) 102 98.5   Fat Free Mass (lbs) 108.5 101.5   Total Body Water (lbs) 79.5 74.5    The following the learning objectives were met by the patient during this course:  Identifies Phase 3A (Soft, High Proteins) Dietary Goals and will begin from 2 weeks post-operatively to 2 months post-operatively  Identifies appropriate sources of fluids and proteins   States protein recommendations and appropriate sources post-operatively  Identifies the need for appropriate texture modifications, mastication, and bite sizes when consuming solids  Identifies appropriate multivitamin and calcium sources post-operatively  Describes the need for physical activity post-operatively and will follow MD recommendations  States when to call healthcare provider regarding medication questions or post-operative complications  Handouts given during class include:  Phase 3A: Soft, High Protein Diet Handout  Follow-Up Plan: Patient will follow-up at [facility] in 6 weeks for 2 month post-op nutrition visit for diet advancement per MD.

## 2014-12-17 ENCOUNTER — Telehealth: Payer: Self-pay | Admitting: *Deleted

## 2014-12-17 NOTE — Telephone Encounter (Signed)
Pt wants to know if we can give her an Rx for B-12 injections that she would get one a month. Pt states that she has not been to see her PCP lately but has recently had surgery.

## 2014-12-18 ENCOUNTER — Encounter: Payer: Self-pay | Admitting: Obstetrics and Gynecology

## 2014-12-18 ENCOUNTER — Ambulatory Visit (INDEPENDENT_AMBULATORY_CARE_PROVIDER_SITE_OTHER): Payer: BLUE CROSS/BLUE SHIELD | Admitting: Obstetrics and Gynecology

## 2014-12-18 VITALS — BP 120/76 | Ht 61.0 in | Wt 199.0 lb

## 2014-12-18 DIAGNOSIS — D519 Vitamin B12 deficiency anemia, unspecified: Secondary | ICD-10-CM

## 2014-12-18 MED ORDER — CYANOCOBALAMIN 1000 MCG/ML IJ SOLN: 1000.0000 ug | Freq: Once | INTRAMUSCULAR | Status: DC

## 2014-12-18 NOTE — Progress Notes (Signed)
Patient ID: Raven Lee, female   DOB: 07/26/1951, 64 y.o.   MRN: [id number]  This chart was scribed for Jonnie Kind, MD by Donato Schultz, ED Scribe. The patient's care was started at 2:25 PM.    [facility] Visit  Patient name: Raven Lee MRN [id number]  Date of birth: 02/16/1951  CC & HPI:  Raven Lee is a 64 y.o. female presenting today for prescription refills.  ROS:  All systems have been reviewed and are negative unless otherwise indicated in the HPI.  Pertinent History Reviewed:   Reviewed: Significant for  Medical         Past Medical History  Diagnosis Date  . Hypertension   . Emphysema/COPD   . Psoriasis   . Hepatitis     HEPATITIS C  . Difficulty sleeping   . History of panic attacks                               Surgical Hx:    Past Surgical History  Procedure Laterality Date  . Abdominal hysterectomy    . Lung lobectomy  Aug 2012    LLL  . Laparoscopic gastric sleeve resection N/A 12/01/2014    Procedure: LAPAROSCOPIC GASTRIC SLEEVE RESECTION UPPER ENDO;  Surgeon: Excell Seltzer, MD;  Location: [facility];  Service: General;  Laterality: N/A;   Medications: Reviewed & Updated - see associated section                       Current outpatient prescriptions:  .  hydrochlorothiazide (HYDRODIURIL) 25 MG tablet, Take 25 mg by mouth every evening. , Disp: , Rfl:  .  Melatonin 3 MG CAPS, Take 1 capsule by mouth daily., Disp: , Rfl:  .  metoprolol (LOPRESSOR) 50 MG tablet, Take 50 mg by mouth 2 (two) times daily. , Disp: , Rfl:  .  mirtazapine (REMERON) 30 MG tablet, Take 30 mg by mouth at bedtime. , Disp: , Rfl:  .  quinapril (ACCUPRIL) 40 MG tablet, Take 40 mg by mouth every evening. , Disp: , Rfl:  .  venlafaxine XR (EFFEXOR-XR) 150 MG 24 hr capsule, Take 150 mg by mouth every evening. , Disp: , Rfl:    Social History: Reviewed -  reports that she quit smoking about 3 years ago. Her smoking use included Cigarettes. She has a 25 pack-year smoking  history. She has never used smokeless tobacco.  Objective Findings:  Vitals: Blood pressure 120/76, height 5\' 1"  (1.549 m), weight 199 lb (90.266 kg).  Physical Examination: General appearance - alert, well appearing, and in no distress, oriented to person, place, and time and overweight Mental status - alert, oriented to person, place, and time, normal mood, behavior, speech, dress, motor activity, and thought processes Abdomen - soft, nontender, nondistended, no masses or organomegaly scars from previous incisions bruising at gastic sleeve site.   Assessment & Plan:   A:  1. 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 Refer to [facility] 2. Rx cyanocobalimin, 1000 mcg weekly x 4 then monthly

## 2014-12-18 NOTE — Progress Notes (Signed)
Patient ID: Raven Lee, female   DOB: 03/18/1951, 64 y.o.   MRN: [id number] Pt here today for Rx. Pt wants to get an Rx for B-12 injection .

## 2014-12-24 ENCOUNTER — Ambulatory Visit: Payer: Self-pay | Admitting: Obstetrics and Gynecology

## 2015-01-27 ENCOUNTER — Ambulatory Visit: Payer: BLUE CROSS/BLUE SHIELD | Admitting: Dietician

## 2016-02-03 ENCOUNTER — Encounter: Payer: Self-pay | Admitting: Obstetrics & Gynecology

## 2016-02-03 ENCOUNTER — Ambulatory Visit (INDEPENDENT_AMBULATORY_CARE_PROVIDER_SITE_OTHER): Payer: BLUE CROSS/BLUE SHIELD | Admitting: Obstetrics & Gynecology

## 2016-02-03 VITALS — BP 120/80 | HR 78 | Wt 172.0 lb

## 2016-02-03 DIAGNOSIS — B373 Candidiasis of vulva and vagina: Secondary | ICD-10-CM

## 2016-02-03 DIAGNOSIS — L900 Lichen sclerosus et atrophicus: Secondary | ICD-10-CM | POA: Diagnosis not present

## 2016-02-03 DIAGNOSIS — B3731 Acute candidiasis of vulva and vagina: Secondary | ICD-10-CM

## 2016-02-03 MED ORDER — FLUCONAZOLE 100 MG PO TABS: 100.0000 mg | ORAL_TABLET | Freq: Every day | ORAL | Status: DC

## 2016-02-03 NOTE — Progress Notes (Signed)
Patient ID: Raven Lee, female   DOB: [birth year], 65 y.o.   MRN: [id number]      Chief Complaint  Patient presents with  . gyn visit    vaginal itching on out side/ odor.    Blood pressure 120/80, pulse 78, weight 172 lb (78.019 kg).  65 y.o. No obstetric history on file. No LMP recorded. Patient has had a hysterectomy. The current method of family planning is post menopausal status.  Subjective Pt having increase in her symptoms She states I saw her a few years ago for LSA and was treated with temovate Biopsy ws benign consistent with LSA She complains of a few weeks of worsening irritation, burning worse with urination or BM No using any meds, has not used any for some time  I lase saw her for it before 2014  Objective Vulva:  Severe erythema and superficial ulceration Vagina:  atrophic Cervix:   Uterus:   Adnexa: ovaries:,    Painted with gentian violet  Pertinent ROS   Labs or studies     Impression Diagnoses this Encounter::   [id number]   1. Lichen sclerosus et atrophicus 701.0 L90.0   2. 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 fluconazole (DIFLUCAN) 100 MG tablet    Sig: Take 1 tablet (100 mg total) by mouth daily.    Dispense:  14 tablet    Refill:  0    Labs or Scans Ordered: No orders of the defined types were placed in this encounter.    Management:: Start by treting for yeast then proceed from there  Follow up Return in about 2 weeks (around 02/17/2016) for Follow up, with Dr Elonda Husky.          All questions were answered.

## 2016-02-18 ENCOUNTER — Encounter: Payer: Self-pay | Admitting: Obstetrics & Gynecology

## 2016-02-18 ENCOUNTER — Ambulatory Visit (INDEPENDENT_AMBULATORY_CARE_PROVIDER_SITE_OTHER): Payer: BLUE CROSS/BLUE SHIELD | Admitting: Obstetrics & Gynecology

## 2016-02-18 VITALS — BP 130/70 | HR 76 | Wt 169.0 lb

## 2016-02-18 DIAGNOSIS — B373 Candidiasis of vulva and vagina: Secondary | ICD-10-CM | POA: Diagnosis not present

## 2016-02-18 DIAGNOSIS — B3731 Acute candidiasis of vulva and vagina: Secondary | ICD-10-CM

## 2016-02-18 DIAGNOSIS — L900 Lichen sclerosus et atrophicus: Secondary | ICD-10-CM

## 2016-02-18 NOTE — Progress Notes (Signed)
Patient ID: Raven Lee, female   DOB: 12-16-1950, 65 y.o.   MRN: [id number]      Chief Complaint  Patient presents with  . Follow-up    Lichen sclerosus    Blood pressure 130/70, pulse 76, weight 169 lb (76.658 kg).  65 y.o. No obstetric history on file. No LMP recorded. Patient has had a hysterectomy. The current method of family planning is status post hysterectomy.  Subjective Pt seen 2 weeks ago for acute yeast with chronic lichen sclerosus Treated with diflucan orally Here for follow up  Symptoms much better but not completely gone  Objective Still with some erythema but not close to 2 weeks previous Resolution of yeast vulvitis  Pertinent ROS No burning with urination, frequency or urgency No nausea, vomiting or diarrhea Nor fever chills or other constitutional symptoms   Labs or studies     Impression Diagnoses this Encounter::   [id number]   1. Lichen sclerosus et atrophicus 701.0 L90.0   2. 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    Labs or Scans Ordered: No orders of the defined types were placed in this encounter.    Management:: Continue topical mycolog routinely up to twice daily Topical steroids as LSA flares  Follow up Return in about 1 month (around 03/19/2016) for Follow up, with Dr Elonda Husky.         All questions were answered.  Past Medical History:  Diagnosis Date  . Difficulty sleeping   . Emphysema/COPD ([place])   . Hepatitis    HEPATITIS C  . History of panic attacks   . Hypertension   . Psoriasis     Past Surgical History:  Procedure Laterality Date  . ABDOMINAL HYSTERECTOMY    . LAPAROSCOPIC GASTRIC SLEEVE RESECTION N/A 12/01/2014   Procedure: LAPAROSCOPIC GASTRIC SLEEVE RESECTION UPPER ENDO;  Surgeon: Excell Seltzer, MD;  Location: [facility];  Service: General;  Laterality: N/A;  .  LUNG LOBECTOMY  Aug 2012   LLL    OB History    No data available      Allergies  Allergen Reactions  . Oxycodone Other (See Comments)    Hallucinations    Social History   Social History  . Marital status: Married    Spouse name: N/A  . Number of children: N/A  . Years of education: N/A   Occupational History  . Retired    Social History Main Topics  . Smoking status: Former Smoker    Packs/day: 1.00    Years: 25.00    Types: Cigarettes    Quit date: 03/30/2011  . Smokeless tobacco: Never Used  . Alcohol use 0.0 oz/week     Comment: socially  . Drug use: No  . Sexual activity: No   Other Topics Concern  . None   Social History Narrative  . None    Family History  Problem Relation Age of Onset  . Lung cancer Mother     smoked  . Heart disease Father   . Obesity Other

## 2016-02-22 ENCOUNTER — Telehealth: Payer: Self-pay | Admitting: Obstetrics & Gynecology

## 2016-02-22 ENCOUNTER — Telehealth: Payer: Self-pay | Admitting: *Deleted

## 2016-02-23 ENCOUNTER — Other Ambulatory Visit: Payer: Self-pay | Admitting: Obstetrics & Gynecology

## 2016-02-23 MED ORDER — NYSTATIN-TRIAMCINOLONE 100000-0.1 UNIT/GM-% EX OINT: 1.0000 "application " | TOPICAL_OINTMENT | Freq: Two times a day (BID) | CUTANEOUS | Status: DC

## 2016-02-23 NOTE — Telephone Encounter (Signed)
Pt informed Rx e-scribed to pharmacy.

## 2016-02-23 NOTE — Telephone Encounter (Signed)
mytrex e prescribed correct?

## 2016-03-17 ENCOUNTER — Ambulatory Visit: Payer: BLUE CROSS/BLUE SHIELD | Admitting: Obstetrics & Gynecology

## 2016-03-17 ENCOUNTER — Encounter: Payer: Self-pay | Admitting: Obstetrics & Gynecology

## 2016-04-27 ENCOUNTER — Ambulatory Visit: Payer: BLUE CROSS/BLUE SHIELD

## 2016-06-07 ENCOUNTER — Encounter (HOSPITAL_COMMUNITY): Payer: Self-pay

## 2017-01-17 ENCOUNTER — Ambulatory Visit: Payer: BLUE CROSS/BLUE SHIELD | Admitting: Obstetrics & Gynecology

## 2017-01-23 IMAGING — DX DG CHEST 2V
2 series · 2 of 2 positions shown · non-contrast
Comparison: 07/14/2014

CLINICAL DATA: Prior smoker. History of COPD. Prior left lower
lobectomy. Bariatric screening.

EXAM:
CHEST  2 VIEW

[chest pa]
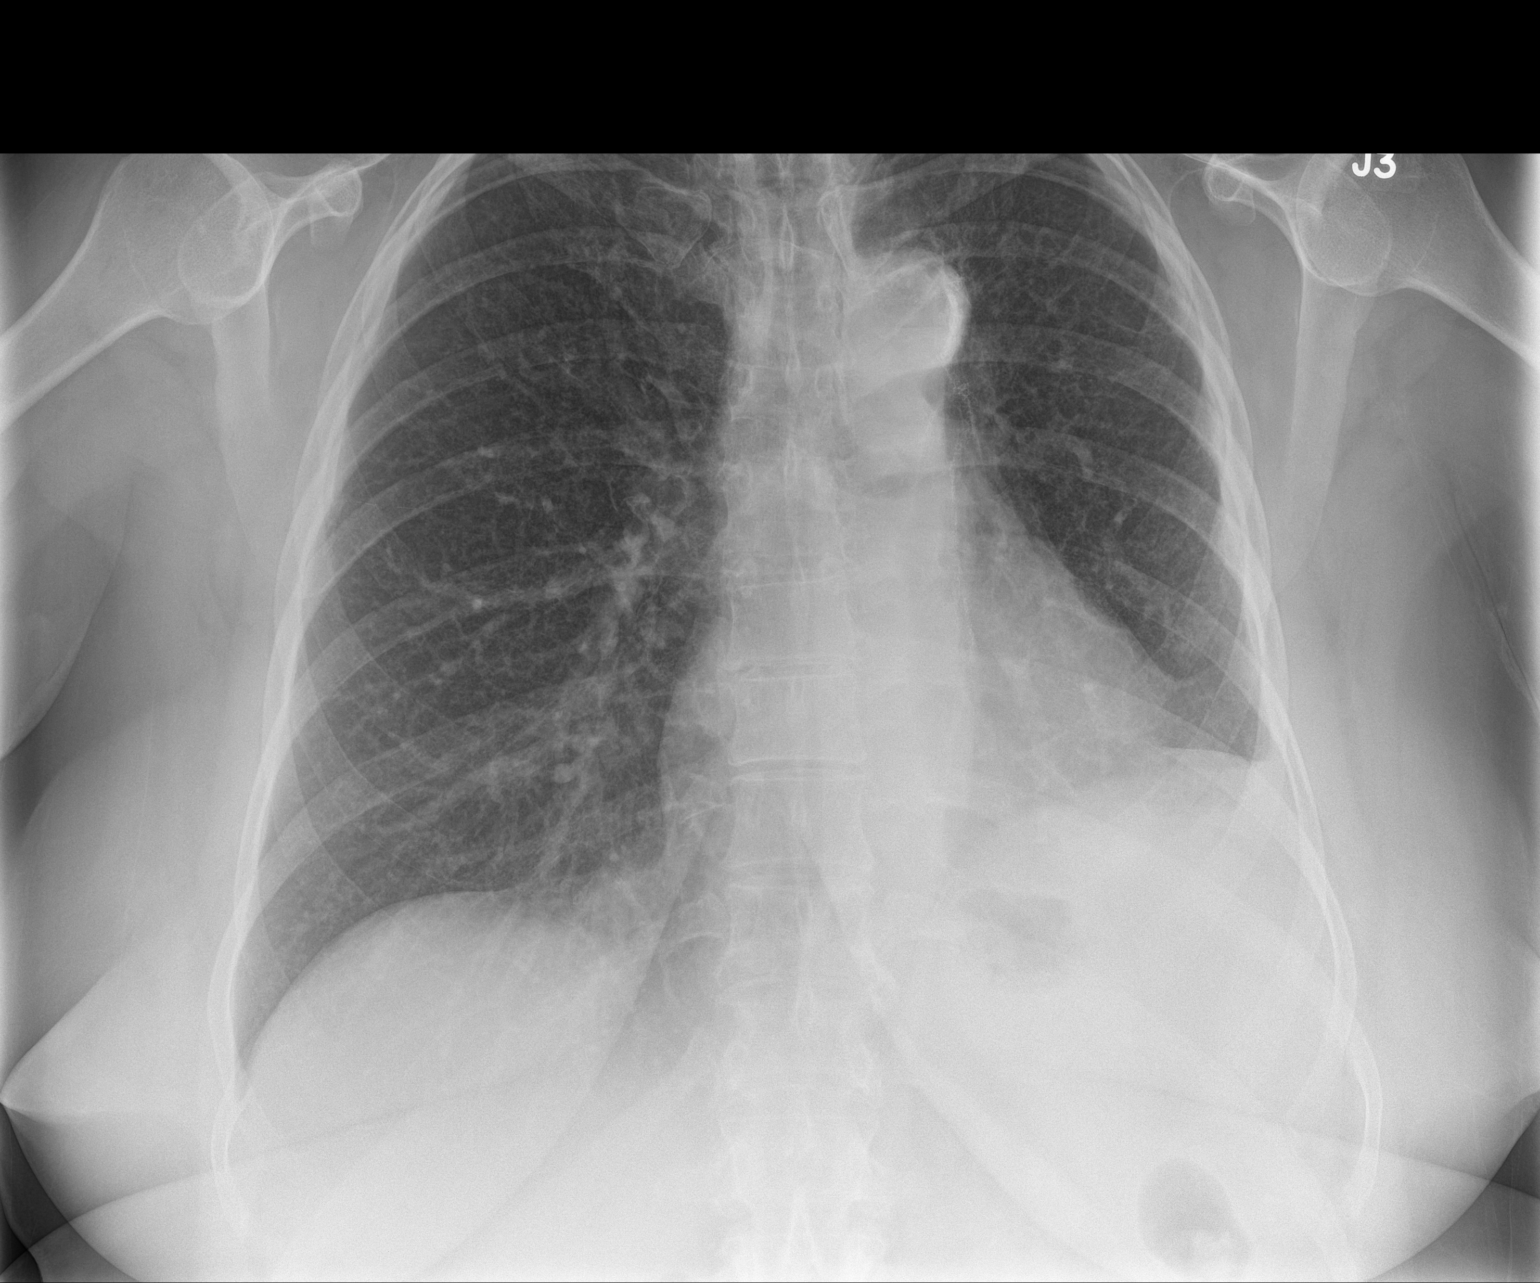

[chest lat]
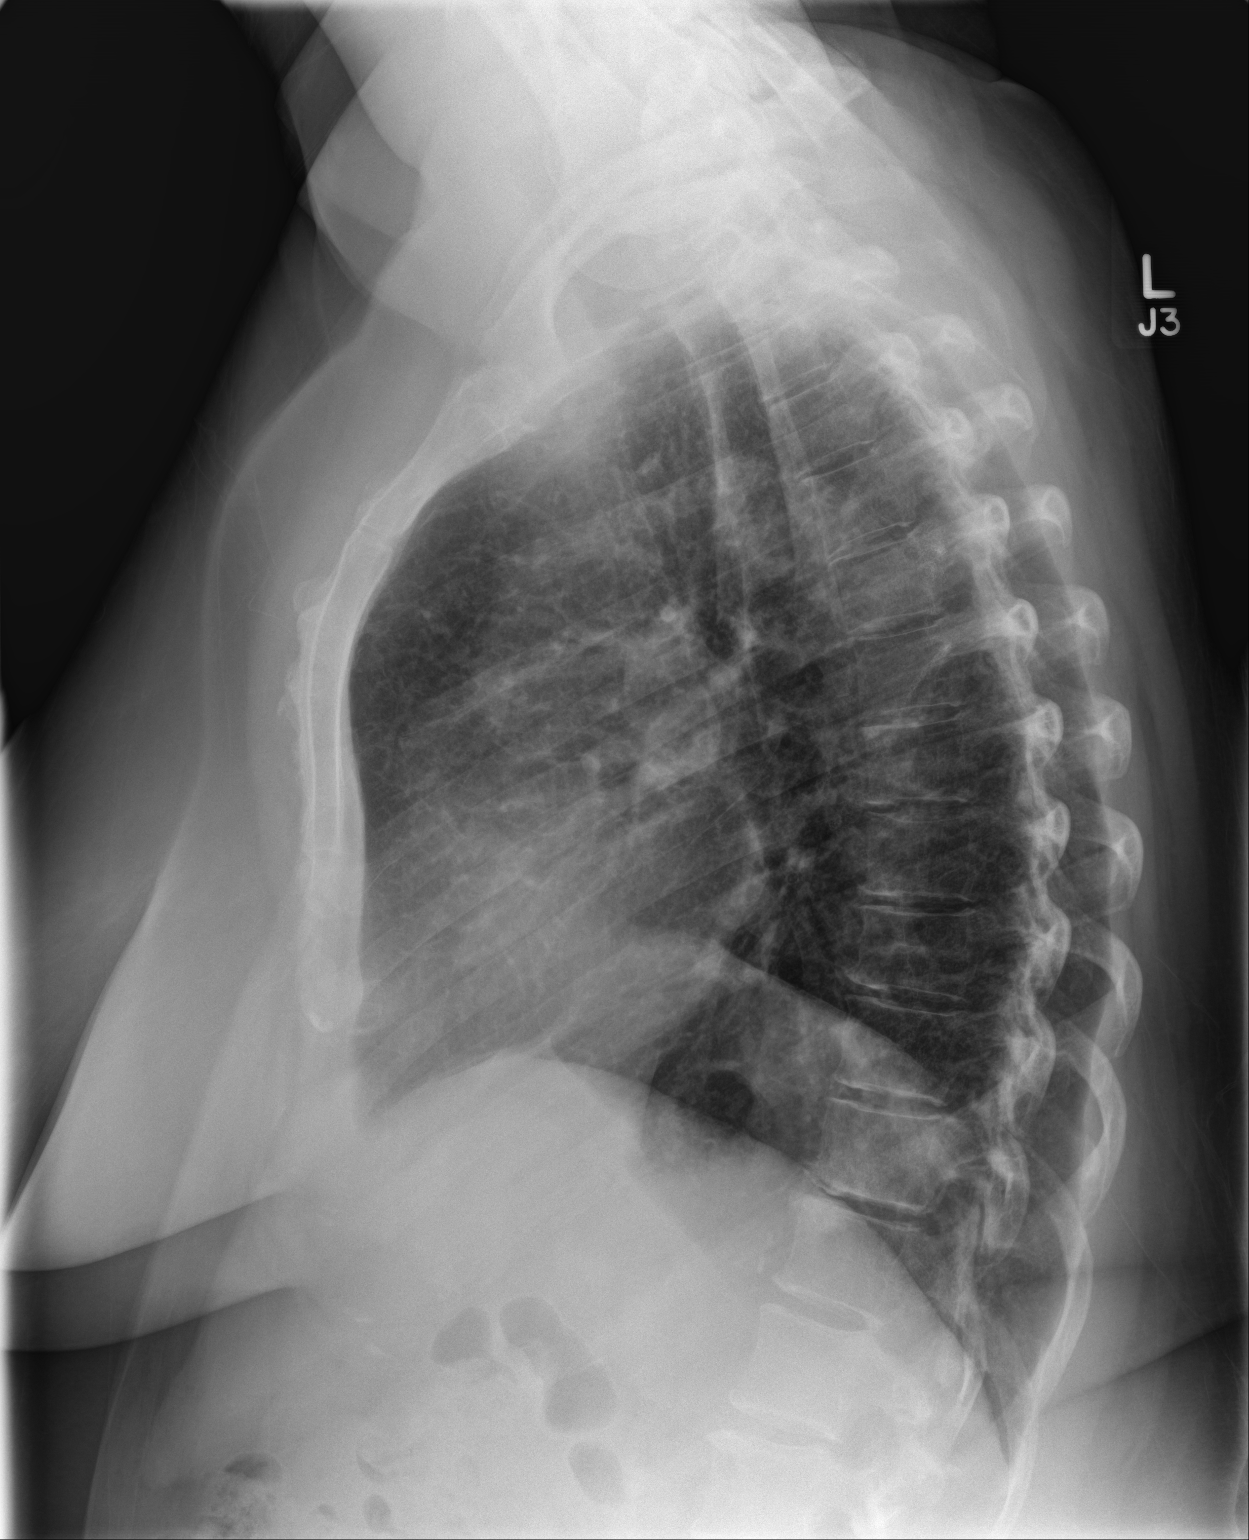

[2 of 2 positions shown; findings below may reference images not displayed]

FINDINGS: Slight volume loss on the left due to prior postoperative changes.
Minimal left base scarring. Right lung is clear. Heart is normal
size. No effusions. No acute bony abnormality.
IMPRESSION: Postoperative changes on the left.  No acute findings.

## 2017-01-30 ENCOUNTER — Ambulatory Visit: Payer: BLUE CROSS/BLUE SHIELD | Admitting: Obstetrics & Gynecology

## 2017-02-27 ENCOUNTER — Encounter: Payer: Self-pay | Admitting: Internal Medicine

## 2017-04-26 ENCOUNTER — Ambulatory Visit: Payer: BLUE CROSS/BLUE SHIELD | Admitting: Gastroenterology

## 2017-04-28 ENCOUNTER — Encounter: Payer: Self-pay | Admitting: General Practice

## 2017-05-09 ENCOUNTER — Ambulatory Visit: Payer: BLUE CROSS/BLUE SHIELD | Admitting: Gastroenterology

## 2017-05-09 ENCOUNTER — Ambulatory Visit (INDEPENDENT_AMBULATORY_CARE_PROVIDER_SITE_OTHER): Payer: BLUE CROSS/BLUE SHIELD | Admitting: Gastroenterology

## 2017-05-09 ENCOUNTER — Encounter: Payer: Self-pay | Admitting: Gastroenterology

## 2017-05-09 DIAGNOSIS — R7989 Other specified abnormal findings of blood chemistry: Secondary | ICD-10-CM | POA: Insufficient documentation

## 2017-05-09 DIAGNOSIS — R945 Abnormal results of liver function studies: Secondary | ICD-10-CM

## 2017-05-09 DIAGNOSIS — B182 Chronic viral hepatitis C: Secondary | ICD-10-CM

## 2017-05-09 NOTE — Progress Notes (Signed)
CC'ED TO PCP 

## 2017-05-09 NOTE — Assessment & Plan Note (Signed)
66 year old lady presenting for consideration of treatment of hepatitis C. States she was told she had hepatitis C nearly 15 years ago. Has been hospitalized for jaundice/hepatitis in 1960s. 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 Received a tattoo in 1989. She has never been treated. Really has never had significant workup. I did see a positive HCV RNA level in 2013. Discussed natural history, modes of transmission. Advised her not to share nail clippers, razor blades, needles, toothbrushes. Would recommend that her husband be checked given his history of jaundice/hepatitis. Will start labs and ultrasound with elastography.   Discussed with patient that once she starts hepatitis C treatment, she cannot add additional medications either over-the-counter or prescription without discussing with [place] first to potential interactions. She voiced understanding.

## 2017-05-09 NOTE — Progress Notes (Signed)
Primary Care Physician:  Celene Squibb, MD  Primary Gastroenterologist:  Garfield Cornea, MD   Chief Complaint  Patient presents with  . Hepatitis C    HPI:  Raven Lee is a 66 y.o. female here at request of Dr. Nevada Crane for further management of chronic Hepatitis C.   First told she had HEP C around 2003 while living in [place]. Never sought treatment because options not that good at the time. Someone recently suggested she seek treatment due to new medication options available. She believes she was infected in 1989 tattoo. Prior to that she had "life insurance screening" and "passed". She does have h/o remote intranasal drug use in the 1960s. No prior blood transfusion. Husband was Hospitalized in the 67s with hepatitis. He has never been checked per her knowledge.   She rarely has heartburn. Takes over-the-counter medications as needed. May take baking soda at times. Denies abdominal pain, no prior history of jaundice. No pruritus. She does have psoriasis which is fairly well-controlled with topical regimens. Bowel movements are normal. No melena rectal bleeding. No dysphagia, vomiting.    Current Outpatient Prescriptions  Medication Sig Dispense Refill  . hydrochlorothiazide (HYDRODIURIL) 25 MG tablet Take 25 mg by mouth every evening.     . metoprolol (LOPRESSOR) 50 MG tablet Take 50 mg by mouth 2 (two) times daily.     . mirtazapine (REMERON) 30 MG tablet Take 30 mg by mouth at bedtime.     Marland Kitchen nystatin-triamcinolone ointment (MYCOLOG) Apply 1 application topically 2 (two) times daily. (Patient taking differently: Apply 1 application topically as needed. ) 30 g 11  . quinapril (ACCUPRIL) 40 MG tablet Take 40 mg by mouth every evening.     . venlafaxine XR (EFFEXOR-XR) 150 MG 24 hr capsule Take 150 mg by mouth every evening.      No current facility-administered medications for this visit.     Allergies as of 05/09/2017 - Review Complete 05/09/2017  Allergen Reaction Noted  .  Oxycodone Other (See Comments) 08/19/2013    Past Medical History:  Diagnosis Date  . Difficulty sleeping   . Emphysema/COPD ([place])   . Hepatitis    HEPATITIS C  . History of panic attacks   . Hypertension   . Psoriasis     Past Surgical History:  Procedure Laterality Date  . ABDOMINAL HYSTERECTOMY    . COLONOSCOPY  2009   [facility]: diverticulosis  . LAPAROSCOPIC GASTRIC SLEEVE RESECTION N/A 12/01/2014   Procedure: LAPAROSCOPIC GASTRIC SLEEVE RESECTION UPPER ENDO;  Surgeon: Excell Seltzer, MD;  Location: [facility];  Service: General;  Laterality: N/A;  . LUNG LOBECTOMY  03/2011   LLL-severe PNA    Family History  Problem Relation Age of Onset  . Lung cancer Mother        smoked  . Heart disease Father   . Obesity Other   . Colon cancer Neg Hx   . Liver disease Neg Hx     Social History   Social History  . Marital status: Married    Spouse name: N/A  . Number of children: N/A  . Years of education: N/A   Occupational History  . Retired    Social History Main Topics  . Smoking status: Former Smoker    Packs/day: 1.00    Years: 25.00    Types: Cigarettes    Quit date: 03/30/2011  . Smokeless tobacco: Never Used  . Alcohol use 0.0 oz/week     Comment: socially  . Drug  use: No  . Sexual activity: No   Other Topics Concern  . Not on file   Social History Narrative  . No narrative on file      ROS:  General: Negative for anorexia, weight loss, fever, chills, fatigue, weakness. Eyes: Negative for vision changes.  ENT: Negative for hoarseness, difficulty swallowing , nasal congestion. CV: Negative for chest pain, angina, palpitations, dyspnea on exertion, peripheral edema.  Respiratory: Negative for dyspnea at rest, dyspnea on exertion, cough, sputum, wheezing.  GI: See history of present illness. GU:  Negative for dysuria, hematuria, urinary incontinence, urinary frequency, nocturnal urination.  MS: Negative for joint pain, low back pain.  Derm: Psoriasis on  the upper chest Neuro: Negative for weakness, abnormal sensation, seizure, frequent headaches, memory loss, confusion.  Psych: Negative for anxiety, depression, suicidal ideation, hallucinations.  Endo: Negative for unusual weight change.  Heme: Negative for bruising or bleeding. Allergy: Negative for rash or hives.    Physical Examination:  BP (!) 156/81   Pulse 92   Temp (!) 96.9 F (36.1 C) (Oral)   Ht 5\' 1"  (1.549 m)   Wt 172 lb (78 kg)   BMI 32.50 kg/m    General: Well-nourished, well-developed in no acute distress.  Head: Normocephalic, atraumatic.   Eyes: Conjunctiva pink, no icterus. Mouth: Oropharyngeal mucosa moist and pink , no lesions erythema or exudate. Neck: Supple without thyromegaly, masses, or lymphadenopathy.  Lungs: Clear to auscultation bilaterally. Psoriasis noted on the upper chest Heart: Regular rate and rhythm, no murmurs rubs or gallops.  Abdomen: Bowel sounds are normal, nontender, nondistended, no hepatosplenomegaly or masses, no abdominal bruits or    hernia , no rebound or guarding.   Rectal: Not performed Extremities: No lower extremity edema. No clubbing or deformities.  Neuro: Alert and oriented x 4 , grossly normal neurologically.  Skin: Warm and dry, no rash or jaundice.   Psych: Alert and cooperative, normal mood and affect.  Labs: Labs from June 2018, platelets 231,000, hemoglobin 14, hematocrit 41.9, white blood cell count 3900, glucose 99, creatinine 0.9, total bilirubin 1.1, alkaline phosphatase 77, AST 121, ALT 60, albumin 3.5  Imaging Studies: No results found.

## 2017-05-09 NOTE — Patient Instructions (Signed)
1. Please have your labs and ultrasound done as discussed.  2. Please have your husband checked for Hepatitis C.  Hepatitis C Hepatitis C is a viral infection of the liver. It can lead to scarring of the liver (cirrhosis), liver failure, or liver cancer. Hepatitis C may go undetected for months or years because people with the infection may not have symptoms, or they may have only mild symptoms. What are the causes? Hepatitis C is caused by the hepatitis C virus (HCV). The virus can be passed from one person to another through:  Blood.  Contaminated needles, such as those used for tattooing, body piercing, acupuncture, or injecting drugs.  Having unprotected sex with an infected person.  Childbirth.  Blood transfusions or organ transplants done in the [place] before 1992.  What increases the risk? Risk factors for hepatitis C include:  Having unprotected sex with an infected person.  Using illegal drugs.  What are the signs or symptoms? Symptoms of hepatitis C may include:  Fatigue.  Loss of appetite.  Nausea.  Vomiting.  Abdominal pain.  Dark yellow urine.  Yellowish skin and eyes (jaundice).  Itching of the skin.  Clay-colored bowel movements.  Joint pain.  Symptoms are not always present. How is this diagnosed? Hepatitis C is diagnosed with blood tests. Other types of tests may also be done to check how your liver is functioning. How is this treated? Your health care provider may perform noninvasive tests or a liver biopsy to help determine the best course of treatment. Treatment for hepatitis C may include one or more medicines. Your health care provider may check you for a recurring infection or other liver conditions every 6-12 months after treatment. Follow these instructions at home:  Rest as needed.  Take all medicines as directed by your health care provider.  Do not take any medicine unless approved by your health care provider. This  includes over-the-counter medicine and birth control pills.  Do not drink alcohol.  Do not have sex until approved by your health care provider.  Do not share toothbrushes, nail clippers, razors, or needles. How is this prevented? There is no vaccine for hepatitis C. The only way to prevent the disease is to reduce the risk of exposure to the virus. This may be done by:  Practicing safe sex and using condoms.  Avoiding illegal drugs.  Contact a health care provider if:  You have a fever.  You develop abdominal pain.  You develop dark urine.  You have clay-colored bowel movements.  You develop joint pains. Get help right away if:  You have increasing fatigue or weakness.  You lose your appetite.  You feel nauseous or vomit.  You develop jaundice or your jaundice gets worse.  You bruise or bleed easily. This information is not intended to replace advice given to you by your health care provider. Make sure you discuss any questions you have with your health care provider. Document Released: 08/12/2000 Document Revised: 01/21/2016 Document Reviewed: 11/27/2013 Elsevier Interactive Patient Education  2017 [employer].

## 2017-05-11 ENCOUNTER — Ambulatory Visit (HOSPITAL_COMMUNITY)
Admission: RE | Admit: 2017-05-11 | Discharge: 2017-05-11 | Disposition: A | Discharge status: Home / Self Care | Payer: BLUE CROSS/BLUE SHIELD | Source: Ambulatory Visit | Attending: Gastroenterology | Admitting: Gastroenterology

## 2017-05-11 ENCOUNTER — Other Ambulatory Visit: Payer: Self-pay | Admitting: Gastroenterology

## 2017-05-11 DIAGNOSIS — B1920 Unspecified viral hepatitis C without hepatic coma: Secondary | ICD-10-CM | POA: Diagnosis not present

## 2017-05-11 DIAGNOSIS — B182 Chronic viral hepatitis C: Secondary | ICD-10-CM | POA: Diagnosis not present

## 2017-05-11 DIAGNOSIS — K824 Cholesterolosis of gallbladder: Secondary | ICD-10-CM | POA: Insufficient documentation

## 2017-05-11 DIAGNOSIS — R945 Abnormal results of liver function studies: Secondary | ICD-10-CM | POA: Diagnosis not present

## 2017-05-12 LAB — IGM: IGM (IMMUNOGLOBULIN M), SRM: 49 mg/dL (ref 26–217)

## 2017-05-12 LAB — IGA: IgA/Immunoglobulin A, Serum: 341 mg/dL (ref 87–352)

## 2017-05-12 LAB — IGG: IgG (Immunoglobin G), Serum: 2864 mg/dL — ABNORMAL HIGH (ref 700–1600)

## 2017-05-14 LAB — COMPREHENSIVE METABOLIC PANEL
A/G RATIO: 0.9 — AB (ref 1.2–2.2)
ALBUMIN: 3.7 g/dL (ref 3.6–4.8)
ALT: 46 IU/L — ABNORMAL HIGH (ref 0–32)
AST: 95 IU/L — ABNORMAL HIGH (ref 0–40)
Alkaline Phosphatase: 85 IU/L (ref 39–117)
BILIRUBIN TOTAL: 1.1 mg/dL (ref 0.0–1.2)
BUN/Creatinine Ratio: 15 (ref 12–28)
BUN: 11 mg/dL (ref 8–27)
CHLORIDE: 103 mmol/L (ref 96–106)
CO2: 28 mmol/L (ref 20–29)
Calcium: 9.2 mg/dL (ref 8.7–10.3)
Creatinine, Ser: 0.75 mg/dL (ref 0.57–1.00)
GFR calc Af Amer: 97 mL/min/{1.73_m2} (ref >59)
GFR calc non Af Amer: 84 mL/min/{1.73_m2} (ref >59)
Globulin, Total: 4 g/dL (ref 1.5–4.5)
Glucose: 102 mg/dL — ABNORMAL HIGH (ref 65–99)
POTASSIUM: 3.6 mmol/L (ref 3.5–5.2)
SODIUM: 143 mmol/L (ref 134–144)
Total Protein: 7.7 g/dL (ref 6.0–8.5)

## 2017-05-14 LAB — HCV RNA QUANT RFLX ULTRA OR GENOTYP
HCV Quant Baseline: 1770000 IU/mL
HCV log10: 6.248 log10 IU/mL

## 2017-05-14 LAB — CBC WITH DIFFERENTIAL/PLATELET
Basophils Absolute: 0 10*3/uL (ref 0.0–0.2)
Basos: 0 %
EOS (ABSOLUTE): 0 10*3/uL (ref 0.0–0.4)
EOS: 0 %
HEMATOCRIT: 40.9 % (ref 34.0–46.6)
Hemoglobin: 13.7 g/dL (ref 11.1–15.9)
Immature Grans (Abs): 0 10*3/uL (ref 0.0–0.1)
Immature Granulocytes: 0 %
LYMPHS ABS: 1 10*3/uL (ref 0.7–3.1)
Lymphs: 39 %
MCH: 31.9 pg (ref 26.6–33.0)
MCHC: 33.5 g/dL (ref 31.5–35.7)
MCV: 95 fL (ref 79–97)
MONOS ABS: 0.3 10*3/uL (ref 0.1–0.9)
Monocytes: 11 %
Neutrophils Absolute: 1.3 10*3/uL — ABNORMAL LOW (ref 1.4–7.0)
Neutrophils: 50 %
Platelets: 117 10*3/uL — ABNORMAL LOW (ref 150–379)
RBC: 4.29 x10E6/uL (ref 3.77–5.28)
RDW: 14.2 % (ref 12.3–15.4)
WBC: 2.6 10*3/uL — ABNORMAL LOW (ref 3.4–10.8)

## 2017-05-14 LAB — PROTIME-INR
INR: 1.1 (ref 0.8–1.2)
PROTHROMBIN TIME: 11.7 s (ref 9.1–12.0)

## 2017-05-14 LAB — IRON AND TIBC
Iron Saturation: 47 % (ref 15–55)
Iron: 152 ug/dL — ABNORMAL HIGH (ref 27–139)
TIBC: 326 ug/dL (ref 250–450)
UIBC: 174 ug/dL (ref 118–369)

## 2017-05-14 LAB — HCV FIBROSURE
ALPHA 2-MACROGLOBULINS, QN: 322 mg/dL — ABNORMAL HIGH (ref 110–276)
ALT (SGPT) P5P: 57 IU/L — ABNORMAL HIGH (ref 0–40)
Apolipoprotein A-1: 164 mg/dL (ref 116–209)
BILIRUBIN, TOTAL: 0.8 mg/dL (ref 0.0–1.2)
Fibrosis Score: 0.8 — ABNORMAL HIGH (ref 0.00–0.21)
GGT: 129 IU/L — ABNORMAL HIGH (ref 0–60)
HAPTOGLOBIN: 32 mg/dL — AB (ref 34–200)
Necroinflammat Activity Score: 0.52 — ABNORMAL HIGH (ref 0.00–0.17)

## 2017-05-14 LAB — HEPATITIS B SURFACE ANTIGEN: Hepatitis B Surface Ag: NEGATIVE

## 2017-05-14 LAB — HEPATITIS C GENOTYPE

## 2017-05-14 LAB — HEPATITIS B CORE ANTIBODY, TOTAL: Hep B Core Total Ab: NEGATIVE

## 2017-05-14 LAB — HEPATITIS A ANTIBODY, TOTAL: HEP A TOTAL AB: NEGATIVE

## 2017-05-14 LAB — MITOCHONDRIAL/SMOOTH MUSCLE AB PNL
Mitochondrial Ab: 6.5 Units (ref 0.0–20.0)
SMOOTH MUSCLE AB: 28 U — AB (ref 0–19)

## 2017-05-14 LAB — ANA: ANA: NEGATIVE

## 2017-05-14 LAB — HEPATITIS B SURFACE ANTIBODY,QUALITATIVE: Hep B Surface Ab, Qual: NONREACTIVE

## 2017-05-14 LAB — FERRITIN: FERRITIN: 112 ng/mL (ref 15–150)

## 2017-05-14 LAB — HIV ANTIBODY (ROUTINE TESTING W REFLEX): HIV Screen 4th Generation wRfx: NONREACTIVE

## 2017-05-15 NOTE — Progress Notes (Signed)
LMOAM for return call.  

## 2017-05-15 NOTE — Progress Notes (Signed)
Spoke with patient. Discussed results.   Please start Harvoni approval process for 12 weeks of treatment.  Please arrange for Hep A/B vaccines.  RUQ U/S in 6 months for hepatoma screening and f/u gallbladder polyps. PLEASE NIC. We will plan for TCS/EGD 2019 after HCV treatment complete.  Will be obtaining future labs as per HCV guidelines and then cirrhosis care thereafter. Future orders to come.  Will repeat smooth musc ab and IgG AFTER HCV eradication.

## 2017-05-15 NOTE — Progress Notes (Signed)
See lab result note. I will discuss with patient personally but go ahead and get ruq u/s NIC.  She will need ruq u/s in 6 months for cirrhosis AND gallbladder polyp.

## 2017-05-15 NOTE — Progress Notes (Signed)
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 Consider Harvoni for 12 weeks.

## 2017-05-15 NOTE — Progress Notes (Signed)
ON RECALL  °

## 2017-05-15 NOTE — Progress Notes (Signed)
HIV, Hep B both negative. She needs vaccinations for both Hep A/B Smooth muscle Ab weak positive and IgG elevated, may be nonspecific in setting of HCV. Other labs AND ultrasound c/w cirrhosis (new finding). MELD 8 indicated preserved liver function Gallbladder polyp, will monitor with u/s.  HCV genotype 1b.

## 2017-05-15 NOTE — Progress Notes (Signed)
She will also need an upper endoscopy to screen for varices at some point, consider doing next year when she is due for colonoscopy.

## 2017-05-16 NOTE — Progress Notes (Signed)
[employer] form on desk for Neil Crouch, PA to complete.

## 2017-05-23 NOTE — Progress Notes (Signed)
Paperwork completed and faxed to [place].

## 2017-05-29 ENCOUNTER — Telehealth: Payer: Self-pay | Admitting: Internal Medicine

## 2017-05-29 NOTE — Telephone Encounter (Signed)
PT said she has started her vaccinations and it will be a month before she gets 2nd Hep b and then 5 months to complete.  I told her we are aware of that and we are working on getting the [facility] for her.

## 2017-05-29 NOTE — Telephone Encounter (Signed)
Please call patient [phone], she has questions about her hepc treatment and vaccinations

## 2017-05-30 ENCOUNTER — Telehealth: Payer: Self-pay

## 2017-05-30 NOTE — Telephone Encounter (Signed)
Pt's insurance requires the patient to use [facility].  Paperwork completed by Neil Crouch, PA and has been faxed.

## 2017-05-30 NOTE — Telephone Encounter (Signed)
Additional paper work completed for HCV med.

## 2017-05-30 NOTE — Telephone Encounter (Signed)
It has been faxed.  

## 2017-06-02 ENCOUNTER — Telehealth: Payer: Self-pay | Admitting: Internal Medicine

## 2017-06-02 NOTE — Telephone Encounter (Signed)
Pt has questions about getting Harvoni-V.  Please call her back at [phone]

## 2017-06-02 NOTE — Telephone Encounter (Signed)
Doris, did you find out anything about her harvoni?

## 2017-06-05 NOTE — Telephone Encounter (Signed)
Called and informed Alinda Sierras at [employer]

## 2017-06-05 NOTE — Telephone Encounter (Signed)
I received a call from [facility] asking about the pt's insurance, said it was showing terminated. I have called pt and left VM for a return call to discuss her insurance.

## 2017-06-05 NOTE — Telephone Encounter (Signed)
Patient called in and stated she does have the red white and blue Medicare(id#[id number]) insurance.    She doesn't have BCBS anymore.

## 2017-06-22 NOTE — Telephone Encounter (Signed)
I spoke with Maudie Mercury at [facility] and she stated the Rx has been approved and requires $500 out of pocket copayment.  They are unable to use the manufacture's coupon because she has Medicare.  She is going to transfer the call to financial assistance department to help with the pt's copayment.

## 2017-06-22 NOTE — Telephone Encounter (Signed)
Per Maudie Mercury they are going to reverify the patient's insurance and make sure the deductible is correct and we should call back within 48 hours to f/u on their findings at [phone].  If the patient needs assistance with copy ament from the foundation she can call [phone].

## 2017-06-26 NOTE — Telephone Encounter (Signed)
Per Phineas Semen the patient has a $500 deductible.

## 2017-06-26 NOTE — Telephone Encounter (Signed)
Patient stated she will pay the $500 deductible, however she will take care of that when she comes back from [place] on 11/7.

## 2017-06-26 NOTE — Telephone Encounter (Signed)
I tried to call the patient to let her know she has a $500 deductible and she can start the process with the foundation on help with that deductible.

## 2017-07-03 ENCOUNTER — Encounter (HOSPITAL_COMMUNITY): Payer: Self-pay

## 2017-07-23 NOTE — Telephone Encounter (Signed)
Please find out status. Has patient followed through with either paying deductible or she can see if can get help from foundation, see notes below.

## 2017-07-24 NOTE — Telephone Encounter (Signed)
LMOM, waiting on a return call.  

## 2017-07-27 NOTE — Telephone Encounter (Signed)
LMOM following up on if pt got her medication.

## 2017-07-31 ENCOUNTER — Telehealth: Payer: Self-pay | Admitting: Internal Medicine

## 2017-07-31 NOTE — Telephone Encounter (Signed)
Patient called and stated she has been out of town but still wants her Harvoni, she is going to have to go back out of town due to a death in her family.  Please call

## 2017-07-31 NOTE — Telephone Encounter (Signed)
Lmom, waiting on a return call.  

## 2017-08-14 NOTE — Telephone Encounter (Signed)
Pt returned call. Pt has been out of town for 1 month since her brother died. Pt plans on returning this week and plans on paying the expense for the medication. Pt also plans on making a f/u appointment when she returns.

## 2017-08-14 NOTE — Telephone Encounter (Signed)
Lmom, to discuss if pt was going to be able to pay for her treatment.

## 2017-08-17 DIAGNOSIS — I10 Essential (primary) hypertension: Secondary | ICD-10-CM | POA: Diagnosis not present

## 2017-08-25 DIAGNOSIS — L900 Lichen sclerosus et atrophicus: Secondary | ICD-10-CM | POA: Diagnosis not present

## 2017-08-25 DIAGNOSIS — G4700 Insomnia, unspecified: Secondary | ICD-10-CM | POA: Diagnosis not present

## 2017-08-25 DIAGNOSIS — R945 Abnormal results of liver function studies: Secondary | ICD-10-CM | POA: Diagnosis not present

## 2017-08-25 DIAGNOSIS — B182 Chronic viral hepatitis C: Secondary | ICD-10-CM | POA: Diagnosis not present

## 2017-08-25 DIAGNOSIS — I10 Essential (primary) hypertension: Secondary | ICD-10-CM | POA: Diagnosis not present

## 2017-08-25 DIAGNOSIS — Z6831 Body mass index (BMI) 31.0-31.9, adult: Secondary | ICD-10-CM | POA: Diagnosis not present

## 2017-08-25 DIAGNOSIS — E870 Hyperosmolality and hypernatremia: Secondary | ICD-10-CM | POA: Diagnosis not present

## 2017-08-30 ENCOUNTER — Telehealth: Payer: Self-pay

## 2017-08-30 NOTE — Telephone Encounter (Signed)
Pt called to let our office know that she is back in town and wants to make an appointment to start [facility] treatment.   Pt will pay the deductible for Harvoni if she can't get assistance through a foundation. Will call pt tomorrow to discuss foundation options. If options aren't available, will have pt come in for ov.

## 2017-08-31 NOTE — Telephone Encounter (Signed)
Lmom, giving pt the foundation number. Please schedule pt an appointment to start Harvoni.

## 2017-09-01 ENCOUNTER — Encounter: Payer: Self-pay | Admitting: Internal Medicine

## 2017-09-01 NOTE — Telephone Encounter (Signed)
PATIENT SCHEDULED  °

## 2017-09-07 NOTE — Telephone Encounter (Signed)
noted 

## 2017-09-26 ENCOUNTER — Encounter: Payer: Self-pay | Admitting: *Deleted

## 2017-09-26 ENCOUNTER — Telehealth: Payer: Self-pay | Admitting: Internal Medicine

## 2017-09-26 NOTE — Telephone Encounter (Signed)
Letter mailed

## 2017-09-26 NOTE — Telephone Encounter (Signed)
Recall for ultrasound 

## 2017-10-23 ENCOUNTER — Ambulatory Visit (INDEPENDENT_AMBULATORY_CARE_PROVIDER_SITE_OTHER): Payer: Medicare Other | Admitting: Gastroenterology

## 2017-10-23 ENCOUNTER — Encounter: Payer: Self-pay | Admitting: *Deleted

## 2017-10-23 ENCOUNTER — Encounter: Payer: Self-pay | Admitting: Gastroenterology

## 2017-10-23 VITALS — BP 167/90 | HR 66 | Temp 96.8°F | Ht 61.0 in | Wt 174.0 lb

## 2017-10-23 DIAGNOSIS — B182 Chronic viral hepatitis C: Secondary | ICD-10-CM

## 2017-10-23 DIAGNOSIS — R7989 Other specified abnormal findings of blood chemistry: Secondary | ICD-10-CM

## 2017-10-23 DIAGNOSIS — K7460 Unspecified cirrhosis of liver: Secondary | ICD-10-CM | POA: Diagnosis not present

## 2017-10-23 DIAGNOSIS — R945 Abnormal results of liver function studies: Secondary | ICD-10-CM

## 2017-10-23 NOTE — Progress Notes (Signed)
cc'ed to pcp °

## 2017-10-23 NOTE — Assessment & Plan Note (Signed)
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 Work up last year revealed cirrhosis, elevated IgG and smooth muscle Ab. Plans for 12 weeks of Harvoni. We will need to update labs and ruq u/s is due as well. She will hold off on Hep A/B vaccinations until after Harvoni completed. She only received one shot and then never went back for her second shot in 06/2017.   Clinically she is doing well and liver disease appears compensated. Discussed compliance at length with patient. She voiced understanding.   Will move towards EGD/TCS later this year but she is most interested in starting HCV treatment.

## 2017-10-23 NOTE — Patient Instructions (Signed)
1. Please have your labs and ultrasound done. 2. We will work on getting Harvoni re-approved.  3. Hold off on Hep A and B vaccinations for now. We will restart vaccinations once you have been treated for Hepatitis C.

## 2017-10-23 NOTE — Progress Notes (Signed)
      Primary Care Physician: Celene Squibb, MD  Primary Gastroenterologist:  Garfield Cornea, MD   Chief Complaint  Patient presents with  . Hepatitis C    HPI: Raven Lee is a 67 y.o. female here for consideration of starting hepatitis C treatment. We saw her back in September 2018 and started the process. It was determined to have cirrhosis, Child Pugh A during her work up. She is treatment naive, genotype 1b with HCV RNA level of 1,770,000. Decided on Harvoni 12 weeks but patient delayed work up due to copay and then had death of a family member and was out of state for extended period of time. During her workup she was noted to have elevated IgG level, positive smooth muscle antibody. She started vaccinations for hepatitis A/B but she did not complete them. Only received one shot. Also found to have a gallbladder polyp ultrasound which will be followed along with her serial ultrasounds for Hepatoma surveillance.  Clinically she feels well. No abd pain. No bowel concerns. No edema. Takes TUMS prn heartburn.   Current Outpatient Medications  Medication Sig Dispense Refill  . hydrochlorothiazide (HYDRODIURIL) 25 MG tablet Take 25 mg by mouth every evening.     . metoprolol (LOPRESSOR) 50 MG tablet Take 50 mg by mouth 2 (two) times daily.     . mirtazapine (REMERON) 30 MG tablet Take 30 mg by mouth at bedtime.     Marland Kitchen nystatin-triamcinolone ointment (MYCOLOG) Apply 1 application topically 2 (two) times daily. (Patient taking differently: Apply 1 application topically as needed. ) 30 g 11  . quinapril (ACCUPRIL) 40 MG tablet Take 40 mg by mouth every evening.     . venlafaxine XR (EFFEXOR-XR) 150 MG 24 hr capsule Take 150 mg by mouth every evening.      No current facility-administered medications for this visit.     Allergies as of 10/23/2017 - Review Complete 10/23/2017  Allergen Reaction Noted  . Oxycodone Other (See Comments) 08/19/2013    ROS:  General: Negative for anorexia,  weight loss, fever, chills, fatigue, weakness. ENT: Negative for hoarseness, difficulty swallowing , nasal congestion. CV: Negative for chest pain, angina, palpitations, dyspnea on exertion, peripheral edema.  Respiratory: Negative for dyspnea at rest, dyspnea on exertion, cough, sputum, wheezing.  GI: See history of present illness. GU:  Negative for dysuria, hematuria, urinary incontinence, urinary frequency, nocturnal urination.  Endo: Negative for unusual weight change.    Physical Examination:   BP (!) 167/90   Pulse 66   Temp (!) 96.8 F (36 C) (Oral)   Ht 5\' 1"  (1.549 m)   Wt 174 lb (78.9 kg)   BMI 32.88 kg/m   General: Well-nourished, well-developed in no acute distress.  Eyes: No icterus. Mouth: Oropharyngeal mucosa moist and pink , no lesions erythema or exudate. Lungs: Clear to auscultation bilaterally.  Heart: Regular rate and rhythm, no murmurs rubs or gallops.  Abdomen: Bowel sounds are normal, nontender, nondistended, no hepatosplenomegaly or masses, no abdominal bruits or hernia , no rebound or guarding.   Extremities: No lower extremity edema. No clubbing or deformities. Neuro: Alert and oriented x 4   Skin: Warm and dry, no jaundice.   Psych: Alert and cooperative, normal mood and affect.

## 2017-10-30 ENCOUNTER — Ambulatory Visit (HOSPITAL_COMMUNITY): Payer: Medicare Other

## 2017-11-01 ENCOUNTER — Ambulatory Visit (HOSPITAL_COMMUNITY)
Admission: RE | Admit: 2017-11-01 | Discharge: 2017-11-01 | Disposition: A | Discharge status: Home / Self Care | Payer: Medicare Other | Source: Ambulatory Visit | Attending: Gastroenterology | Admitting: Gastroenterology

## 2017-11-01 DIAGNOSIS — K7460 Unspecified cirrhosis of liver: Secondary | ICD-10-CM | POA: Diagnosis not present

## 2017-11-01 DIAGNOSIS — K824 Cholesterolosis of gallbladder: Secondary | ICD-10-CM | POA: Diagnosis not present

## 2017-11-01 DIAGNOSIS — R945 Abnormal results of liver function studies: Secondary | ICD-10-CM | POA: Insufficient documentation

## 2017-11-01 DIAGNOSIS — B182 Chronic viral hepatitis C: Secondary | ICD-10-CM | POA: Insufficient documentation

## 2017-11-01 DIAGNOSIS — R7989 Other specified abnormal findings of blood chemistry: Secondary | ICD-10-CM

## 2017-11-08 NOTE — Progress Notes (Signed)
Please let patient know, she needs MRI liver with EOVIST to evaluate right liver lesion.  She has stable gallbladder polyps.   Please schedule MRI liver with EOVIST within the next one week.

## 2017-11-10 ENCOUNTER — Encounter: Payer: Self-pay | Admitting: *Deleted

## 2017-11-13 DIAGNOSIS — R945 Abnormal results of liver function studies: Secondary | ICD-10-CM | POA: Diagnosis not present

## 2017-11-13 DIAGNOSIS — B182 Chronic viral hepatitis C: Secondary | ICD-10-CM | POA: Diagnosis not present

## 2017-11-13 DIAGNOSIS — K7460 Unspecified cirrhosis of liver: Secondary | ICD-10-CM | POA: Diagnosis not present

## 2017-11-14 LAB — CBC WITH DIFFERENTIAL/PLATELET
Basophils Absolute: 0 10*3/uL (ref 0.0–0.2)
Basos: 0 %
EOS (ABSOLUTE): 0 10*3/uL (ref 0.0–0.4)
Eos: 0 %
Hematocrit: 41.1 % (ref 34.0–46.6)
Hemoglobin: 14.1 g/dL (ref 11.1–15.9)
Immature Grans (Abs): 0 10*3/uL (ref 0.0–0.1)
Immature Granulocytes: 0 %
LYMPHS ABS: 1.2 10*3/uL (ref 0.7–3.1)
Lymphs: 35 %
MCH: 33.3 pg — AB (ref 26.6–33.0)
MCHC: 34.3 g/dL (ref 31.5–35.7)
MCV: 97 fL (ref 79–97)
MONOS ABS: 0.5 10*3/uL (ref 0.1–0.9)
Monocytes: 13 %
NEUTROS ABS: 1.8 10*3/uL (ref 1.4–7.0)
Neutrophils: 52 %
PLATELETS: 138 10*3/uL — AB (ref 150–379)
RBC: 4.23 x10E6/uL (ref 3.77–5.28)
RDW: 14.6 % (ref 12.3–15.4)
WBC: 3.4 10*3/uL (ref 3.4–10.8)

## 2017-11-14 LAB — IRON,TIBC AND FERRITIN PANEL
FERRITIN: 131 ng/mL (ref 15–150)
IRON: 222 ug/dL — AB (ref 27–139)
Iron Saturation: 73 % — ABNORMAL HIGH (ref 15–55)
Total Iron Binding Capacity: 306 ug/dL (ref 250–450)
UIBC: 84 ug/dL — AB (ref 118–369)

## 2017-11-14 LAB — COMPREHENSIVE METABOLIC PANEL
ALT: 43 IU/L — AB (ref 0–32)
AST: 95 IU/L — ABNORMAL HIGH (ref 0–40)
Albumin/Globulin Ratio: 0.9 — ABNORMAL LOW (ref 1.2–2.2)
Albumin: 3.5 g/dL — ABNORMAL LOW (ref 3.6–4.8)
Alkaline Phosphatase: 99 IU/L (ref 39–117)
BUN/Creatinine Ratio: 12 (ref 12–28)
BUN: 9 mg/dL (ref 8–27)
Bilirubin Total: 1.2 mg/dL (ref 0.0–1.2)
CHLORIDE: 104 mmol/L (ref 96–106)
CO2: 28 mmol/L (ref 20–29)
Calcium: 9.2 mg/dL (ref 8.7–10.3)
Creatinine, Ser: 0.78 mg/dL (ref 0.57–1.00)
GFR calc Af Amer: 92 mL/min/{1.73_m2} (ref >59)
GFR calc non Af Amer: 80 mL/min/{1.73_m2} (ref >59)
GLUCOSE: 63 mg/dL — AB (ref 65–99)
Globulin, Total: 4.1 g/dL (ref 1.5–4.5)
POTASSIUM: 3.5 mmol/L (ref 3.5–5.2)
Sodium: 145 mmol/L — ABNORMAL HIGH (ref 134–144)
Total Protein: 7.6 g/dL (ref 6.0–8.5)

## 2017-11-14 LAB — PROTIME-INR
INR: 1.1 (ref 0.8–1.2)
Prothrombin Time: 11.4 s (ref 9.1–12.0)

## 2017-11-14 LAB — ANTI-SMOOTH MUSCLE ANTIBODY, IGG: Smooth Muscle Ab: 24 Units — ABNORMAL HIGH (ref 0–19)

## 2017-11-14 LAB — IGG: IgG (Immunoglobin G), Serum: 2709 mg/dL — ABNORMAL HIGH (ref 700–1600)

## 2017-11-20 ENCOUNTER — Telehealth: Payer: Self-pay | Admitting: Internal Medicine

## 2017-11-20 DIAGNOSIS — K769 Liver disease, unspecified: Secondary | ICD-10-CM

## 2017-11-20 NOTE — Telephone Encounter (Signed)
Pt first called to make an OV with LSL to go over results. When she found out it would be 5/17 before I could get her in with LSL she said to just have LSL call her instead about her results and Hep C treatments.  [phone]

## 2017-11-22 NOTE — Addendum Note (Signed)
Addended by: Inge Rise on: 11/22/2017 04:53 PM   Modules accepted: Orders

## 2017-11-22 NOTE — Telephone Encounter (Signed)
Lmom, waiting on a return call.  

## 2017-11-22 NOTE — Telephone Encounter (Signed)
Pt was suppose to have an MRI of the liver, please schedule.

## 2017-11-22 NOTE — Telephone Encounter (Signed)
MRI scheduled for 11/29/17 at 9:00am, arrival time 8:45am, npo 6 hrs prior. Patient aware and needed nothing further

## 2017-11-26 NOTE — Progress Notes (Signed)
MELD 8.  AST/ALT mildly elevated. Smooth muscle ab weakly positive, IgG up.  Iron/sat elevated on multiple occasions.   At this point, let's have her go back for labs for hemochromatosis genetic markers and AFP tumor marker.  Await MRI results.  Repeat CBC, Pt/INR, CMET, AFP in 6 months.

## 2017-11-29 ENCOUNTER — Ambulatory Visit (HOSPITAL_COMMUNITY)
Admission: RE | Admit: 2017-11-29 | Discharge: 2017-11-29 | Disposition: A | Discharge status: Home / Self Care | Payer: Medicare Other | Source: Ambulatory Visit | Attending: Gastroenterology | Admitting: Gastroenterology

## 2017-11-29 DIAGNOSIS — K769 Liver disease, unspecified: Secondary | ICD-10-CM | POA: Diagnosis present

## 2017-11-29 DIAGNOSIS — R161 Splenomegaly, not elsewhere classified: Secondary | ICD-10-CM | POA: Insufficient documentation

## 2017-11-29 DIAGNOSIS — I8500 Esophageal varices without bleeding: Secondary | ICD-10-CM | POA: Insufficient documentation

## 2017-11-29 DIAGNOSIS — K7460 Unspecified cirrhosis of liver: Secondary | ICD-10-CM | POA: Insufficient documentation

## 2017-11-29 MED ORDER — GADOBENATE DIMEGLUMINE 529 MG/ML IV SOLN
15.0000 mL | Freq: Once | INTRAVENOUS | Status: AC | PRN
  Administered 2017-11-29: 15 mL via INTRAVENOUS

## 2017-12-07 ENCOUNTER — Other Ambulatory Visit: Payer: Self-pay

## 2017-12-07 DIAGNOSIS — K7460 Unspecified cirrhosis of liver: Secondary | ICD-10-CM

## 2017-12-13 NOTE — Progress Notes (Signed)
Please let patient know that her MRI shows cirrhosis (already known) and a large nodule in the right lobe of the liver measuring 5.4 X 3.4 cm. 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   I would like for her to get AFP tumor marker now. If abnormal, then she may require liver biopsy. Hep C treatment is on hold until we sort this out.

## 2017-12-18 ENCOUNTER — Other Ambulatory Visit: Payer: Self-pay

## 2017-12-18 DIAGNOSIS — K7460 Unspecified cirrhosis of liver: Secondary | ICD-10-CM

## 2017-12-19 NOTE — Progress Notes (Signed)
I have reviewed MRI with Dr. Earle Gell, based on MRI there are no typical features suggestive of [place]. Suspects regenerative nodule. Recommends AFP, which is currently pending. Cannot rule out possible liver biopsy but await AFP results.   Have also been in communication with Roosevelt Locks, at [facility] liver care. Given portal HTN would avoid [place]. Patient is child pugh class A. Would discuss in depth with [facility] prior to considering treatment here locally given change in status.

## 2017-12-26 DIAGNOSIS — K7460 Unspecified cirrhosis of liver: Secondary | ICD-10-CM | POA: Diagnosis not present

## 2017-12-27 LAB — AFP TUMOR MARKER: AFP, SERUM, TUMOR MARKER: 13.5 ng/mL — AB (ref 0.0–8.3)

## 2018-01-01 LAB — HEMOCHROMATOSIS DNA-PCR(C282Y,H63D)

## 2018-01-04 NOTE — Progress Notes (Signed)
ON RECALL  °

## 2018-01-04 NOTE — Progress Notes (Signed)
PT is aware to monitor the lesion in 3 months. Pt said she has had the AFP tumor marker done.  Stacey, please nic the MRI in 3 months.

## 2018-01-18 ENCOUNTER — Telehealth: Payer: Self-pay | Admitting: Internal Medicine

## 2018-01-18 NOTE — Telephone Encounter (Signed)
Pt was returning call to AM. [phone]

## 2018-01-19 ENCOUNTER — Other Ambulatory Visit: Payer: Self-pay | Admitting: *Deleted

## 2018-01-19 DIAGNOSIS — K769 Liver disease, unspecified: Secondary | ICD-10-CM

## 2018-01-19 DIAGNOSIS — K7460 Unspecified cirrhosis of liver: Secondary | ICD-10-CM

## 2018-01-19 DIAGNOSIS — B182 Chronic viral hepatitis C: Secondary | ICD-10-CM

## 2018-01-23 NOTE — Telephone Encounter (Signed)
Noted documentation in result note.

## 2018-01-29 DIAGNOSIS — K769 Liver disease, unspecified: Secondary | ICD-10-CM | POA: Diagnosis not present

## 2018-01-29 DIAGNOSIS — B182 Chronic viral hepatitis C: Secondary | ICD-10-CM | POA: Diagnosis not present

## 2018-01-29 DIAGNOSIS — K7469 Other cirrhosis of liver: Secondary | ICD-10-CM | POA: Diagnosis not present

## 2018-01-29 DIAGNOSIS — Z148 Genetic carrier of other disease: Secondary | ICD-10-CM | POA: Diagnosis not present

## 2018-02-14 DIAGNOSIS — F331 Major depressive disorder, recurrent, moderate: Secondary | ICD-10-CM | POA: Diagnosis not present

## 2018-02-14 DIAGNOSIS — Z6831 Body mass index (BMI) 31.0-31.9, adult: Secondary | ICD-10-CM | POA: Diagnosis not present

## 2018-02-14 DIAGNOSIS — B182 Chronic viral hepatitis C: Secondary | ICD-10-CM | POA: Diagnosis not present

## 2018-02-14 DIAGNOSIS — I10 Essential (primary) hypertension: Secondary | ICD-10-CM | POA: Diagnosis not present

## 2018-02-14 DIAGNOSIS — G4700 Insomnia, unspecified: Secondary | ICD-10-CM | POA: Diagnosis not present

## 2018-02-28 ENCOUNTER — Telehealth: Payer: Self-pay | Admitting: Gastroenterology

## 2018-02-28 NOTE — Telephone Encounter (Signed)
Lmom, waiting on a return call.  

## 2018-02-28 NOTE — Telephone Encounter (Signed)
Please let patient know that I received correspondence from [facility] in [place] and they are requesting EGD to screen for varices.   Please make ov for follow up so we can schedule EGD.

## 2018-03-05 ENCOUNTER — Telehealth: Payer: Self-pay | Admitting: Internal Medicine

## 2018-03-05 NOTE — Telephone Encounter (Signed)
Recall for mri °

## 2018-03-05 NOTE — Telephone Encounter (Signed)
Letter mailed

## 2018-03-07 NOTE — Telephone Encounter (Signed)
Lmom, waiting on a return call.  

## 2018-03-12 ENCOUNTER — Telehealth: Payer: Self-pay

## 2018-03-12 ENCOUNTER — Other Ambulatory Visit: Payer: Self-pay

## 2018-03-12 DIAGNOSIS — K769 Liver disease, unspecified: Secondary | ICD-10-CM

## 2018-03-12 NOTE — Telephone Encounter (Signed)
Pt called office and LMOVM,  received letter for MRI recall. MRI Liver with Eovist scheduled for 04/04/18 at 10:00am, arrive at 9:45am. Tried to call pt to inform of appt, no answer, LMOVM. Letter mailed.

## 2018-03-12 NOTE — Telephone Encounter (Signed)
Raven Lee, this patient was referred to [facility] liver care and they are now managing her liver lesion and HCV.   Please cancel MRI here. She needs to follow up with them. Last I heard, they were having her last MRI films from April re-read at [facility].

## 2018-03-12 NOTE — Telephone Encounter (Signed)
Called Central Scheduling, MRI cancelled. Tried to call pt, no answer, LMOVM and informed her MRI was cancelled and will need to f/u with liver clinic.

## 2018-03-14 NOTE — Telephone Encounter (Signed)
Letter mailed for pt to call for plan of care.

## 2018-03-14 NOTE — Telephone Encounter (Signed)
I spoke to Roosevelt Locks, NP at [facility] liver care. They are in process of contacting patient to repeat her MRI.

## 2018-03-15 NOTE — Telephone Encounter (Signed)
Noted  

## 2018-03-19 ENCOUNTER — Other Ambulatory Visit: Payer: Self-pay | Admitting: Nurse Practitioner

## 2018-03-19 DIAGNOSIS — K7460 Unspecified cirrhosis of liver: Secondary | ICD-10-CM

## 2018-03-19 DIAGNOSIS — K769 Liver disease, unspecified: Secondary | ICD-10-CM

## 2018-03-25 ENCOUNTER — Other Ambulatory Visit: Payer: Medicare Other

## 2018-04-03 ENCOUNTER — Ambulatory Visit
Admission: RE | Admit: 2018-04-03 | Discharge: 2018-04-03 | Disposition: A | Discharge status: Home / Self Care | Payer: Medicare Other | Source: Ambulatory Visit | Attending: Nurse Practitioner | Admitting: Nurse Practitioner

## 2018-04-03 DIAGNOSIS — K7460 Unspecified cirrhosis of liver: Secondary | ICD-10-CM

## 2018-04-03 DIAGNOSIS — K760 Fatty (change of) liver, not elsewhere classified: Secondary | ICD-10-CM | POA: Diagnosis not present

## 2018-04-03 DIAGNOSIS — K769 Liver disease, unspecified: Secondary | ICD-10-CM

## 2018-04-03 MED ORDER — GADOXETATE DISODIUM 0.25 MMOL/ML IV SOLN
8.0000 mL | Freq: Once | INTRAVENOUS | Status: AC | PRN
  Administered 2018-04-03: 8 mL via INTRAVENOUS

## 2018-04-04 ENCOUNTER — Ambulatory Visit (HOSPITAL_COMMUNITY): Payer: Medicare Other

## 2018-04-06 ENCOUNTER — Telehealth: Payer: Self-pay

## 2018-04-06 NOTE — Telephone Encounter (Signed)
Received a letter back that said not deliverable. For reason mailed see phone note of 02/28/2018 Neil Crouch, PA.

## 2018-04-10 ENCOUNTER — Other Ambulatory Visit (HOSPITAL_COMMUNITY): Payer: Self-pay | Admitting: Adult Health Nurse Practitioner

## 2018-04-10 ENCOUNTER — Ambulatory Visit (HOSPITAL_COMMUNITY)
Admission: RE | Admit: 2018-04-10 | Discharge: 2018-04-10 | Disposition: A | Discharge status: Home / Self Care | Payer: Medicare Other | Source: Ambulatory Visit | Attending: Adult Health Nurse Practitioner | Admitting: Adult Health Nurse Practitioner

## 2018-04-10 DIAGNOSIS — W19XXXA Unspecified fall, initial encounter: Secondary | ICD-10-CM

## 2018-04-10 DIAGNOSIS — S82831A Other fracture of upper and lower end of right fibula, initial encounter for closed fracture: Secondary | ICD-10-CM | POA: Diagnosis not present

## 2018-04-10 DIAGNOSIS — M25571 Pain in right ankle and joints of right foot: Secondary | ICD-10-CM

## 2018-04-10 DIAGNOSIS — M7731 Calcaneal spur, right foot: Secondary | ICD-10-CM | POA: Diagnosis not present

## 2018-04-10 DIAGNOSIS — S82434A Nondisplaced oblique fracture of shaft of right fibula, initial encounter for closed fracture: Secondary | ICD-10-CM | POA: Diagnosis not present

## 2018-04-10 DIAGNOSIS — I70201 Unspecified atherosclerosis of native arteries of extremities, right leg: Secondary | ICD-10-CM | POA: Diagnosis not present

## 2018-04-10 DIAGNOSIS — Z6831 Body mass index (BMI) 31.0-31.9, adult: Secondary | ICD-10-CM | POA: Diagnosis not present

## 2018-04-12 ENCOUNTER — Ambulatory Visit (INDEPENDENT_AMBULATORY_CARE_PROVIDER_SITE_OTHER): Payer: Medicare Other | Admitting: Orthopaedic Surgery

## 2018-04-12 ENCOUNTER — Encounter (INDEPENDENT_AMBULATORY_CARE_PROVIDER_SITE_OTHER): Payer: Self-pay | Admitting: Orthopaedic Surgery

## 2018-04-12 VITALS — BP 153/102 | HR 69 | Ht 60.0 in | Wt 170.0 lb

## 2018-04-12 DIAGNOSIS — S93431A Sprain of tibiofibular ligament of right ankle, initial encounter: Secondary | ICD-10-CM

## 2018-04-12 NOTE — Progress Notes (Signed)
Office Visit Note   Patient: Raven Lee           Date of Birth: [birth year]           MRN: [id number] Visit Date: 04/12/2018              Requested by: Celene Squibb, MD [address] [place],  [address] PCP: Celene Squibb, MD   Assessment & Plan: Visit Diagnoses: Right ankle deltoid ligament sprain.  Syndesmosis sprain grade 1 right ankle.  Plan: Cam boot she can remove for bathing.  She will wear the boot when she is up and ambulating.  Office follow-up 4 weeks for recheck no x-ray needed on return.  Follow-Up Instructions: Return in about 4 weeks (around 05/10/2018).   Orders:  No orders of the defined types were placed in this encounter.  No orders of the defined types were placed in this encounter.     Procedures: No procedures performed   Clinical Data: No additional findings.   Subjective: Chief Complaint  Patient presents with  . Right Ankle - Fracture    HPI 67 year old female was going down steps when she caught her foot inside her pajamas fell with an abrasion over the anterolateral aspect of her right ankle and a chip avulsion off the medial malleolus with deltoid ligament sprain.  She has tenderness medially swelling and also has some tenderness over the syndesmosis consistent with a high ankle sprain.  Past history of ankle injury.  Right ankle was isolated injury with her fall.  She denies chills or fever.  She saw Wende Neighbors MD who placed her in a cam boot.  Review of Systems 14 pt  point review of systems noncontributory.  Patient's flying to [place] on Tuesday for her sister's 50th wedding anniversary.   Objective: Vital Signs: BP (!) 153/102   Pulse 69   Ht 5' (1.524 m)   Wt 170 lb (77.1 kg)   BMI 33.20 kg/m   Physical Exam  Constitutional: She is oriented to person, place, and time. She appears well-developed.  HENT:  Head: Normocephalic.  Right Ear: External ear normal.  Left Ear: External ear normal.  Eyes: Pupils are equal,  round, and reactive to light.  Neck: No tracheal deviation present. No thyromegaly present.  Cardiovascular: Normal rate.  Pulmonary/Chest: Effort normal.  Abdominal: Soft.  Neurological: She is alert and oriented to person, place, and time.  Skin: Skin is warm and dry.  Psychiatric: She has a normal mood and affect. Her behavior is normal.    Ortho Exam anterolateral abrasion over the ankle just medial to anterior tib tendon which is not full-thickness abrasion.  Tenderness over the deltoid ligament also syndesmosis no tenderness over the anterior talofibular ligament peroneal EHL is intact.  Specialty Comments:  No specialty comments available.  Imaging: No results found.   PMFS History: Patient Active Problem List   Diagnosis Date Noted  . Sprain of tibiofibular ligament of right ankle 04/12/2018  . Cirrhosis ([place]) 10/23/2017  . Chronic hepatitis C without hepatic coma ([place]) 05/09/2017  . Abnormal LFTs 05/09/2017  . Morbid obesity ([place]) 12/01/2014  . COPD GOLD II 07/05/2014  . Dyspnea 07/04/2014   Past Medical History:  Diagnosis Date  . Difficulty sleeping   . Emphysema/COPD ([place])   . Hepatitis    HEPATITIS C  . History of panic attacks   . Hypertension   . Psoriasis     Family History  Problem Relation Age  of Onset  . Lung cancer Mother        smoked  . Heart disease Father   . Obesity Other   . Colon cancer Neg Hx   . Liver disease Neg Hx     Past Surgical History:  Procedure Laterality Date  . ABDOMINAL HYSTERECTOMY    . COLONOSCOPY  2009   [facility]: diverticulosis  . LAPAROSCOPIC GASTRIC SLEEVE RESECTION N/A 12/01/2014   Procedure: LAPAROSCOPIC GASTRIC SLEEVE RESECTION UPPER ENDO;  Surgeon: Excell Seltzer, MD;  Location: [facility] ORS;  Service: General;  Laterality: N/A;  . LUNG LOBECTOMY  03/2011   LLL-severe PNA   Social History   Occupational History  . Occupation: Retired  Tobacco Use  . Smoking status: Former Smoker    Packs/day: 1.00    Years:  25.00    Pack years: 25.00    Types: Cigarettes    Last attempt to quit: 03/30/2011    Years since quitting: 7.0  . Smokeless tobacco: Never Used  Substance and Sexual Activity  . Alcohol use: Yes    Alcohol/week: 0.0 standard drinks    Comment: socially  . Drug use: No  . Sexual activity: Never

## 2018-05-03 DIAGNOSIS — B182 Chronic viral hepatitis C: Secondary | ICD-10-CM | POA: Diagnosis not present

## 2018-05-03 DIAGNOSIS — M25572 Pain in left ankle and joints of left foot: Secondary | ICD-10-CM | POA: Diagnosis not present

## 2018-05-03 DIAGNOSIS — Z6831 Body mass index (BMI) 31.0-31.9, adult: Secondary | ICD-10-CM | POA: Diagnosis not present

## 2018-05-03 DIAGNOSIS — J060 Acute laryngopharyngitis: Secondary | ICD-10-CM | POA: Diagnosis not present

## 2018-05-10 ENCOUNTER — Ambulatory Visit (INDEPENDENT_AMBULATORY_CARE_PROVIDER_SITE_OTHER): Payer: Medicare Other | Admitting: Orthopaedic Surgery

## 2018-05-14 DIAGNOSIS — K7469 Other cirrhosis of liver: Secondary | ICD-10-CM | POA: Diagnosis not present

## 2018-05-14 DIAGNOSIS — B182 Chronic viral hepatitis C: Secondary | ICD-10-CM | POA: Diagnosis not present

## 2018-05-14 DIAGNOSIS — K769 Liver disease, unspecified: Secondary | ICD-10-CM | POA: Diagnosis not present

## 2018-05-17 ENCOUNTER — Ambulatory Visit (INDEPENDENT_AMBULATORY_CARE_PROVIDER_SITE_OTHER): Payer: Medicare Other | Admitting: Orthopaedic Surgery

## 2018-05-24 ENCOUNTER — Ambulatory Visit (INDEPENDENT_AMBULATORY_CARE_PROVIDER_SITE_OTHER): Payer: Medicare Other | Admitting: Orthopaedic Surgery

## 2018-05-28 ENCOUNTER — Other Ambulatory Visit: Payer: Self-pay

## 2018-05-28 DIAGNOSIS — K7460 Unspecified cirrhosis of liver: Secondary | ICD-10-CM

## 2018-05-28 DIAGNOSIS — K769 Liver disease, unspecified: Secondary | ICD-10-CM

## 2018-05-28 NOTE — Telephone Encounter (Addendum)
Just seeing that we were never able to get in touch with patient.   Can we try again? [facility] is still recommending we do EGD to screen for varices.   Patient just seen at [facility] liver care this month. ?obtain contact info from them if we don't have updated contact info

## 2018-05-28 NOTE — Telephone Encounter (Signed)
Raven Lee please try to contact the patient via telephone and verify her address.

## 2018-05-28 NOTE — Telephone Encounter (Signed)
Lmom, waiting on a return call.  

## 2018-05-31 NOTE — Telephone Encounter (Signed)
Letter mailed to pt.  

## 2018-05-31 NOTE — Telephone Encounter (Signed)
Lmom, waiting on a return call.  

## 2018-10-11 DIAGNOSIS — K7469 Other cirrhosis of liver: Secondary | ICD-10-CM | POA: Diagnosis not present

## 2018-10-11 DIAGNOSIS — B182 Chronic viral hepatitis C: Secondary | ICD-10-CM | POA: Diagnosis not present

## 2018-11-27 DIAGNOSIS — L900 Lichen sclerosus et atrophicus: Secondary | ICD-10-CM | POA: Diagnosis not present

## 2018-11-27 DIAGNOSIS — B3789 Other sites of candidiasis: Secondary | ICD-10-CM | POA: Diagnosis not present

## 2019-01-10 DIAGNOSIS — Z0000 Encounter for general adult medical examination without abnormal findings: Secondary | ICD-10-CM | POA: Diagnosis not present

## 2019-02-25 DIAGNOSIS — K769 Liver disease, unspecified: Secondary | ICD-10-CM | POA: Diagnosis not present

## 2019-02-25 DIAGNOSIS — K7469 Other cirrhosis of liver: Secondary | ICD-10-CM | POA: Diagnosis not present

## 2019-02-25 DIAGNOSIS — B182 Chronic viral hepatitis C: Secondary | ICD-10-CM | POA: Diagnosis not present

## 2019-03-05 ENCOUNTER — Other Ambulatory Visit: Payer: Self-pay | Admitting: Nurse Practitioner

## 2019-03-05 DIAGNOSIS — K7469 Other cirrhosis of liver: Secondary | ICD-10-CM

## 2019-05-17 ENCOUNTER — Telehealth: Payer: Self-pay | Admitting: Gastroenterology

## 2019-05-17 NOTE — Telephone Encounter (Signed)
We were unsuccessful before in reaching patient. She followed up with [facility] in 01/2019. Needs EGD here. Needs ov first. Needs f/u cirrhosis/liver lesion. Looks like she has not done MRI ordered by [facility] liver care.   PLEASE MAKE APPT HERE. IF WE NEED TO REACH OUT TO [facility] TO MAKE SURE WE HAVE MOST UP TO DATE CONTACT INFORMATION, PLEASE DO.

## 2019-05-20 NOTE — Telephone Encounter (Signed)
Lmom, waiting on a return call.  

## 2019-05-21 NOTE — Telephone Encounter (Signed)
Lmom, waiting on a return call. Mailing a letter to pt.

## 2019-05-30 NOTE — Telephone Encounter (Signed)
Called [facility] and they have the same contact info that our office has. Mailed letter to pt. Waiting on a return call.

## 2019-07-01 ENCOUNTER — Other Ambulatory Visit (HOSPITAL_COMMUNITY): Payer: Self-pay | Admitting: Internal Medicine

## 2019-07-01 DIAGNOSIS — L900 Lichen sclerosus et atrophicus: Secondary | ICD-10-CM | POA: Diagnosis not present

## 2019-07-01 DIAGNOSIS — Z1231 Encounter for screening mammogram for malignant neoplasm of breast: Secondary | ICD-10-CM

## 2019-07-01 DIAGNOSIS — B3789 Other sites of candidiasis: Secondary | ICD-10-CM | POA: Diagnosis not present

## 2019-07-11 ENCOUNTER — Ambulatory Visit (HOSPITAL_COMMUNITY): Payer: Medicare Other

## 2019-07-24 DIAGNOSIS — R945 Abnormal results of liver function studies: Secondary | ICD-10-CM | POA: Diagnosis not present

## 2019-07-24 DIAGNOSIS — R69 Illness, unspecified: Secondary | ICD-10-CM | POA: Diagnosis not present

## 2019-07-24 DIAGNOSIS — Z1329 Encounter for screening for other suspected endocrine disorder: Secondary | ICD-10-CM | POA: Diagnosis not present

## 2019-07-24 DIAGNOSIS — E559 Vitamin D deficiency, unspecified: Secondary | ICD-10-CM | POA: Diagnosis not present

## 2019-07-24 DIAGNOSIS — R7301 Impaired fasting glucose: Secondary | ICD-10-CM | POA: Diagnosis not present

## 2019-07-24 DIAGNOSIS — I10 Essential (primary) hypertension: Secondary | ICD-10-CM | POA: Diagnosis not present

## 2019-07-29 DIAGNOSIS — R69 Illness, unspecified: Secondary | ICD-10-CM | POA: Diagnosis not present

## 2019-07-30 DIAGNOSIS — D72819 Decreased white blood cell count, unspecified: Secondary | ICD-10-CM | POA: Diagnosis not present

## 2019-07-30 DIAGNOSIS — R69 Illness, unspecified: Secondary | ICD-10-CM | POA: Diagnosis not present

## 2019-07-30 DIAGNOSIS — D696 Thrombocytopenia, unspecified: Secondary | ICD-10-CM | POA: Diagnosis not present

## 2019-07-30 DIAGNOSIS — I10 Essential (primary) hypertension: Secondary | ICD-10-CM | POA: Diagnosis not present

## 2019-07-30 DIAGNOSIS — L900 Lichen sclerosus et atrophicus: Secondary | ICD-10-CM | POA: Diagnosis not present

## 2019-07-30 DIAGNOSIS — G4700 Insomnia, unspecified: Secondary | ICD-10-CM | POA: Diagnosis not present

## 2019-08-05 DIAGNOSIS — R69 Illness, unspecified: Secondary | ICD-10-CM | POA: Diagnosis not present

## 2019-08-05 DIAGNOSIS — D376 Neoplasm of uncertain behavior of liver, gallbladder and bile ducts: Secondary | ICD-10-CM | POA: Diagnosis not present

## 2019-08-05 DIAGNOSIS — K7469 Other cirrhosis of liver: Secondary | ICD-10-CM | POA: Diagnosis not present

## 2019-08-06 ENCOUNTER — Other Ambulatory Visit (HOSPITAL_COMMUNITY): Payer: Self-pay | Admitting: Nurse Practitioner

## 2019-08-06 ENCOUNTER — Other Ambulatory Visit: Payer: Self-pay | Admitting: Nurse Practitioner

## 2019-08-06 DIAGNOSIS — K7469 Other cirrhosis of liver: Secondary | ICD-10-CM

## 2019-08-08 ENCOUNTER — Telehealth: Payer: Self-pay | Admitting: *Deleted

## 2019-09-03 ENCOUNTER — Ambulatory Visit (HOSPITAL_COMMUNITY): Payer: Medicare HMO

## 2019-09-05 ENCOUNTER — Ambulatory Visit (HOSPITAL_COMMUNITY): Payer: Medicare HMO

## 2019-09-12 ENCOUNTER — Other Ambulatory Visit: Payer: Self-pay

## 2019-09-12 ENCOUNTER — Ambulatory Visit (HOSPITAL_COMMUNITY)
Admission: RE | Admit: 2019-09-12 | Discharge: 2019-09-12 | Disposition: A | Discharge status: Home / Self Care | Payer: Medicare HMO | Source: Ambulatory Visit | Attending: Nurse Practitioner | Admitting: Nurse Practitioner

## 2019-09-12 DIAGNOSIS — K7469 Other cirrhosis of liver: Secondary | ICD-10-CM | POA: Insufficient documentation

## 2019-09-12 DIAGNOSIS — K7460 Unspecified cirrhosis of liver: Secondary | ICD-10-CM | POA: Diagnosis not present

## 2019-09-12 DIAGNOSIS — K760 Fatty (change of) liver, not elsewhere classified: Secondary | ICD-10-CM | POA: Diagnosis not present

## 2019-09-12 LAB — POCT I-STAT CREATININE: Creatinine, Ser: 0.8 mg/dL (ref 0.44–1.00)

## 2019-09-12 MED ORDER — GADOXETATE DISODIUM 0.25 MMOL/ML IV SOLN
5.0000 mL | Freq: Once | INTRAVENOUS | Status: AC | PRN
  Administered 2019-09-12: 8 mL via INTRAVENOUS

## 2019-10-14 DIAGNOSIS — D376 Neoplasm of uncertain behavior of liver, gallbladder and bile ducts: Secondary | ICD-10-CM | POA: Diagnosis not present

## 2019-10-14 DIAGNOSIS — K7469 Other cirrhosis of liver: Secondary | ICD-10-CM | POA: Diagnosis not present

## 2019-10-14 DIAGNOSIS — R69 Illness, unspecified: Secondary | ICD-10-CM | POA: Diagnosis not present

## 2020-08-05 DIAGNOSIS — D72819 Decreased white blood cell count, unspecified: Secondary | ICD-10-CM | POA: Diagnosis not present

## 2020-08-05 DIAGNOSIS — Z6831 Body mass index (BMI) 31.0-31.9, adult: Secondary | ICD-10-CM | POA: Diagnosis not present

## 2020-08-05 DIAGNOSIS — I10 Essential (primary) hypertension: Secondary | ICD-10-CM | POA: Diagnosis not present

## 2020-08-05 DIAGNOSIS — Z0000 Encounter for general adult medical examination without abnormal findings: Secondary | ICD-10-CM | POA: Diagnosis not present

## 2020-08-05 DIAGNOSIS — R69 Illness, unspecified: Secondary | ICD-10-CM | POA: Diagnosis not present

## 2020-08-05 DIAGNOSIS — R945 Abnormal results of liver function studies: Secondary | ICD-10-CM | POA: Diagnosis not present

## 2020-08-05 DIAGNOSIS — D696 Thrombocytopenia, unspecified: Secondary | ICD-10-CM | POA: Diagnosis not present

## 2020-08-05 DIAGNOSIS — B3789 Other sites of candidiasis: Secondary | ICD-10-CM | POA: Diagnosis not present

## 2020-08-05 DIAGNOSIS — M25571 Pain in right ankle and joints of right foot: Secondary | ICD-10-CM | POA: Diagnosis not present

## 2020-08-07 DIAGNOSIS — R69 Illness, unspecified: Secondary | ICD-10-CM | POA: Diagnosis not present

## 2020-08-07 DIAGNOSIS — D696 Thrombocytopenia, unspecified: Secondary | ICD-10-CM | POA: Diagnosis not present

## 2020-08-07 DIAGNOSIS — D72819 Decreased white blood cell count, unspecified: Secondary | ICD-10-CM | POA: Diagnosis not present

## 2020-08-07 DIAGNOSIS — I10 Essential (primary) hypertension: Secondary | ICD-10-CM | POA: Diagnosis not present

## 2020-08-07 DIAGNOSIS — G4700 Insomnia, unspecified: Secondary | ICD-10-CM | POA: Diagnosis not present

## 2020-08-07 DIAGNOSIS — L900 Lichen sclerosus et atrophicus: Secondary | ICD-10-CM | POA: Diagnosis not present

## 2020-08-07 DIAGNOSIS — R5382 Chronic fatigue, unspecified: Secondary | ICD-10-CM | POA: Diagnosis not present

## 2020-11-17 DIAGNOSIS — N3000 Acute cystitis without hematuria: Secondary | ICD-10-CM | POA: Diagnosis not present

## 2020-11-17 DIAGNOSIS — R3915 Urgency of urination: Secondary | ICD-10-CM | POA: Diagnosis not present

## 2020-11-17 DIAGNOSIS — M25572 Pain in left ankle and joints of left foot: Secondary | ICD-10-CM | POA: Diagnosis not present

## 2020-11-17 DIAGNOSIS — Z712 Person consulting for explanation of examination or test findings: Secondary | ICD-10-CM | POA: Diagnosis not present

## 2020-11-17 DIAGNOSIS — J060 Acute laryngopharyngitis: Secondary | ICD-10-CM | POA: Diagnosis not present

## 2020-11-17 DIAGNOSIS — R945 Abnormal results of liver function studies: Secondary | ICD-10-CM | POA: Diagnosis not present

## 2020-11-17 DIAGNOSIS — R69 Illness, unspecified: Secondary | ICD-10-CM | POA: Diagnosis not present

## 2020-11-17 DIAGNOSIS — Z6831 Body mass index (BMI) 31.0-31.9, adult: Secondary | ICD-10-CM | POA: Diagnosis not present

## 2020-11-17 DIAGNOSIS — D696 Thrombocytopenia, unspecified: Secondary | ICD-10-CM | POA: Diagnosis not present

## 2020-11-17 DIAGNOSIS — B3789 Other sites of candidiasis: Secondary | ICD-10-CM | POA: Diagnosis not present

## 2020-11-17 DIAGNOSIS — M25571 Pain in right ankle and joints of right foot: Secondary | ICD-10-CM | POA: Diagnosis not present

## 2020-11-17 DIAGNOSIS — D72819 Decreased white blood cell count, unspecified: Secondary | ICD-10-CM | POA: Diagnosis not present

## 2020-11-17 DIAGNOSIS — N218 Other lower urinary tract calculus: Secondary | ICD-10-CM | POA: Diagnosis not present

## 2021-02-15 DIAGNOSIS — I10 Essential (primary) hypertension: Secondary | ICD-10-CM | POA: Diagnosis not present

## 2021-02-18 ENCOUNTER — Other Ambulatory Visit (HOSPITAL_COMMUNITY): Payer: Self-pay | Admitting: Family Medicine

## 2021-02-18 DIAGNOSIS — D696 Thrombocytopenia, unspecified: Secondary | ICD-10-CM | POA: Diagnosis not present

## 2021-02-18 DIAGNOSIS — R7301 Impaired fasting glucose: Secondary | ICD-10-CM | POA: Diagnosis not present

## 2021-02-18 DIAGNOSIS — R5382 Chronic fatigue, unspecified: Secondary | ICD-10-CM | POA: Diagnosis not present

## 2021-02-18 DIAGNOSIS — L900 Lichen sclerosus et atrophicus: Secondary | ICD-10-CM | POA: Diagnosis not present

## 2021-02-18 DIAGNOSIS — R7989 Other specified abnormal findings of blood chemistry: Secondary | ICD-10-CM | POA: Diagnosis not present

## 2021-02-18 DIAGNOSIS — I10 Essential (primary) hypertension: Secondary | ICD-10-CM | POA: Diagnosis not present

## 2021-02-18 DIAGNOSIS — Z1231 Encounter for screening mammogram for malignant neoplasm of breast: Secondary | ICD-10-CM

## 2021-02-18 DIAGNOSIS — R69 Illness, unspecified: Secondary | ICD-10-CM | POA: Diagnosis not present

## 2021-02-24 ENCOUNTER — Ambulatory Visit (HOSPITAL_COMMUNITY): Payer: Medicare HMO

## 2021-02-25 ENCOUNTER — Ambulatory Visit (HOSPITAL_COMMUNITY): Payer: Medicare HMO

## 2021-03-04 ENCOUNTER — Other Ambulatory Visit: Payer: Self-pay

## 2021-03-04 ENCOUNTER — Ambulatory Visit (HOSPITAL_COMMUNITY)
Admission: RE | Admit: 2021-03-04 | Discharge: 2021-03-04 | Disposition: A | Discharge status: Home / Self Care | Payer: Medicare HMO | Source: Ambulatory Visit | Attending: Family Medicine | Admitting: Family Medicine

## 2021-03-04 DIAGNOSIS — Z1231 Encounter for screening mammogram for malignant neoplasm of breast: Secondary | ICD-10-CM | POA: Insufficient documentation

## 2021-03-17 DIAGNOSIS — L308 Other specified dermatitis: Secondary | ICD-10-CM | POA: Diagnosis not present

## 2021-03-17 DIAGNOSIS — X32XXXA Exposure to sunlight, initial encounter: Secondary | ICD-10-CM | POA: Diagnosis not present

## 2021-03-17 DIAGNOSIS — L570 Actinic keratosis: Secondary | ICD-10-CM | POA: Diagnosis not present

## 2021-04-23 ENCOUNTER — Inpatient Hospital Stay (HOSPITAL_COMMUNITY): Payer: Medicare HMO | Admitting: Hematology and Oncology

## 2021-05-14 ENCOUNTER — Inpatient Hospital Stay (HOSPITAL_COMMUNITY): Payer: Medicare HMO | Admitting: Hematology and Oncology

## 2021-05-28 ENCOUNTER — Other Ambulatory Visit: Payer: Self-pay

## 2021-05-28 ENCOUNTER — Inpatient Hospital Stay (HOSPITAL_COMMUNITY): Payer: Medicare HMO | Attending: Hematology and Oncology | Admitting: Hematology

## 2021-05-28 ENCOUNTER — Inpatient Hospital Stay (HOSPITAL_COMMUNITY): Payer: Medicare HMO

## 2021-05-28 ENCOUNTER — Encounter (HOSPITAL_COMMUNITY): Payer: Self-pay | Admitting: Hematology

## 2021-05-28 VITALS — BP 164/78 | HR 120 | Temp 97.1°F | Resp 19 | Wt 158.4 lb

## 2021-05-28 DIAGNOSIS — D72819 Decreased white blood cell count, unspecified: Secondary | ICD-10-CM | POA: Diagnosis not present

## 2021-05-28 DIAGNOSIS — B182 Chronic viral hepatitis C: Secondary | ICD-10-CM | POA: Diagnosis not present

## 2021-05-28 DIAGNOSIS — D696 Thrombocytopenia, unspecified: Secondary | ICD-10-CM | POA: Diagnosis not present

## 2021-05-28 DIAGNOSIS — R69 Illness, unspecified: Secondary | ICD-10-CM | POA: Diagnosis not present

## 2021-05-28 LAB — CBC WITH DIFFERENTIAL/PLATELET
Abs Immature Granulocytes: 0.01 10*3/uL (ref 0.00–0.07)
Basophils Absolute: 0 10*3/uL (ref 0.0–0.1)
Basophils Relative: 1 %
Eosinophils Absolute: 0 10*3/uL (ref 0.0–0.5)
Eosinophils Relative: 0 %
HCT: 43.2 % (ref 36.0–46.0)
Hemoglobin: 14.6 g/dL (ref 12.0–15.0)
Immature Granulocytes: 0 %
Lymphocytes Relative: 28 %
Lymphs Abs: 0.8 10*3/uL (ref 0.7–4.0)
MCH: 33.9 pg (ref 26.0–34.0)
MCHC: 33.8 g/dL (ref 30.0–36.0)
MCV: 100.2 fL — ABNORMAL HIGH (ref 80.0–100.0)
Monocytes Absolute: 0.4 10*3/uL (ref 0.1–1.0)
Monocytes Relative: 15 %
Neutro Abs: 1.6 10*3/uL — ABNORMAL LOW (ref 1.7–7.7)
Neutrophils Relative %: 56 %
Platelets: 87 10*3/uL — ABNORMAL LOW (ref 150–400)
RBC: 4.31 MIL/uL (ref 3.87–5.11)
RDW: 13.3 % (ref 11.5–15.5)
WBC: 2.8 10*3/uL — ABNORMAL LOW (ref 4.0–10.5)
nRBC: 0 % (ref 0.0–0.2)

## 2021-05-28 LAB — RETICULOCYTES
Immature Retic Fract: 13.1 % (ref 2.3–15.9)
RBC.: 4.24 MIL/uL (ref 3.87–5.11)
Retic Count, Absolute: 131.4 10*3/uL (ref 19.0–186.0)
Retic Ct Pct: 3.1 % (ref 0.4–3.1)

## 2021-05-28 LAB — HEPATITIS B SURFACE ANTIBODY,QUALITATIVE: Hep B S Ab: REACTIVE — AB

## 2021-05-28 LAB — FOLATE: Folate: 7.4 ng/mL (ref >5.9)

## 2021-05-28 LAB — VITAMIN B12: Vitamin B-12: 257 pg/mL (ref 180–914)

## 2021-05-28 LAB — HEPATITIS B CORE ANTIBODY, TOTAL: Hep B Core Total Ab: NONREACTIVE

## 2021-05-28 LAB — HEPATITIS B SURFACE ANTIGEN: Hepatitis B Surface Ag: NONREACTIVE

## 2021-05-28 LAB — LACTATE DEHYDROGENASE: LDH: 256 U/L — ABNORMAL HIGH (ref 98–192)

## 2021-05-28 NOTE — Patient Instructions (Addendum)
[place] at [facility] Discharge Instructions  You were seen today by Dr. Delton Coombes.  He wants to repeat your MRI to follow up on those benign spots on the liver.  He wants to do some blood work today and then he will see you back after the MRI to discuss results.  In about 2 weeks.     Thank you for choosing [place] at [facility] to provide your oncology and hematology care.  To afford each patient quality time with our provider, please arrive at least 15 minutes before your scheduled appointment time.   If you have a lab appointment with the [facility] please come in thru the Main Entrance and check in at the main information desk.  You need to re-schedule your appointment should you arrive 10 or more minutes late.  We strive to give you quality time with our providers, and arriving late affects you and other patients whose appointments are after yours.  Also, if you no show three or more times for appointments you may be dismissed from the clinic at the providers discretion.     Again, thank you for choosing [facility].  Our hope is that these requests will decrease the amount of time that you wait before being seen by our physicians.       _____________________________________________________________  Should you have questions after your visit to [facility], please contact our office at [phone] and follow the prompts.  Our office hours are 8:00 a.m. and 4:30 p.m. Monday - Friday.  Please note that voicemails left after 4:00 p.m. may not be returned until the following business day.  We are closed weekends and major holidays.  You do have access to a nurse 24-7, just call the main number to the clinic [phone] and do not press any options, hold on the line and a nurse will answer the phone.    For prescription refill requests, have your pharmacy contact our office and allow 72 hours.    Due to Covid,  you will need to wear a mask upon entering the hospital. If you do not have a mask, a mask will be given to you at the Main Entrance upon arrival. For doctor visits, patients may have 1 support person age 2 or older with them. For treatment visits, patients can not have anyone with them due to social distancing guidelines and our immunocompromised population.

## 2021-05-28 NOTE — Progress Notes (Signed)
[facility] NOTE  Patient Care Team: Celene Squibb, MD as PCP - General (Internal Medicine) Gala Romney Cristopher Estimable, MD as Consulting Physician (Gastroenterology)  CHIEF COMPLAINTS/PURPOSE OF CONSULTATION:  Leukopenia and thrombocytopenia.  HISTORY OF PRESENTING ILLNESS:  Raven Lee 70 y.o. female is seen for further work-up and management of leukopenia and thrombocytopenia at the request of Dr. Nevada Crane.  CBC at Dr. Juel Burrow office from 02/15/2021 with Salome 1.3, white count 2.4 and platelet count 84.  There is question of aggregation of platelets.  Hemoglobin was normal.  Denies any prior transfusion history.  Denies fevers, night sweats or weight loss although reports decreased energy for the last 6 months.  She reports chronic hepatitis C, likely got it from tattoo.  She apparently was treated with Epclusa and was cured.  Denies any use of quinine or tonic water.  She has easy bruising of the upper extremities when her dog jumped on her.  Otherwise no active bleeding.  She currently works at [employer] daily.  Prior to that she was working in [employer].  She smoked 1 and half pack per day and started smoking at age 21 and quit in 2012.  She reports that she did not inhale it much.  No family history of thrombocytopenia.  Mother died of lung cancer.  Maternal aunt died of cancer, type unknown to patient.  MEDICAL HISTORY:  Past Medical History:  Diagnosis Date   Difficulty sleeping    Emphysema/COPD ([place])    Hepatitis    HEPATITIS C   History of panic attacks    Hypertension    Psoriasis     SURGICAL HISTORY: Past Surgical History:  Procedure Laterality Date   ABDOMINAL HYSTERECTOMY     COLONOSCOPY  2009   [facility]: diverticulosis   LAPAROSCOPIC GASTRIC SLEEVE RESECTION N/A 12/01/2014   Procedure: LAPAROSCOPIC GASTRIC SLEEVE RESECTION UPPER ENDO;  Surgeon: Excell Seltzer, MD;  Location: [facility];  Service: General;  Laterality: N/A;   LUNG LOBECTOMY  03/2011    LLL-severe PNA    SOCIAL HISTORY: Social History   Socioeconomic History   Marital status: Married    Spouse name: Not on file   Number of children: Not on file   Years of education: Not on file   Highest education level: Not on file  Occupational History   Occupation: Retired    Fish farm manager: [employer]  Tobacco Use   Smoking status: Former    Packs/day: 1.00    Years: 25.00    Pack years: 25.00    Types: Cigarettes    Quit date: 03/30/2011    Years since quitting: 10.1   Smokeless tobacco: Never  Vaping Use   Vaping Use: Never used  Substance and Sexual Activity   Alcohol use: Yes    Alcohol/week: 0.0 standard drinks    Comment: socially   Drug use: No   Sexual activity: Never  Other Topics Concern   Not on file  Social History Narrative   Not on file   Social Determinants of Health   Financial Resource Strain: Not on file  Food Insecurity: Not on file  Transportation Needs: Not on file  Physical Activity: Not on file  Stress: Not on file  Social Connections: Not on file  Intimate Partner Violence: Not on file    FAMILY HISTORY: Family History  Problem Relation Age of Onset   Lung cancer Mother        smoked   Heart disease Father  Heart attack Brother    Diabetes Brother    Stroke Brother    Obesity Other    Colon cancer Neg Hx    Liver disease Neg Hx     ALLERGIES:  is allergic to oxycodone.  MEDICATIONS:  Current Outpatient Medications  Medication Sig Dispense Refill   hydrochlorothiazide (HYDRODIURIL) 25 MG tablet Take 25 mg by mouth every evening.      metoprolol (LOPRESSOR) 50 MG tablet Take 50 mg by mouth 2 (two) times daily.      mirtazapine (REMERON) 30 MG tablet Take 30 mg by mouth at bedtime.      nystatin-triamcinolone ointment (MYCOLOG) Apply 1 application topically 2 (two) times daily. (Patient taking differently: Apply 1 application topically as needed.) 30 g 11   quinapril (ACCUPRIL) 40 MG tablet Take 40 mg by mouth every evening.       venlafaxine XR (EFFEXOR-XR) 150 MG 24 hr capsule Take 150 mg by mouth every evening.      No current facility-administered medications for this visit.    REVIEW OF SYSTEMS:   Constitutional: Denies fevers, chills or abnormal night sweats.  Energy levels at 50%. Eyes: Denies blurriness of vision, double vision or watery eyes Ears, nose, mouth, throat, and face: Denies mucositis or sore throat Respiratory: Denies cough, dyspnea or wheezes Cardiovascular: Denies palpitation, chest discomfort or lower extremity swelling Gastrointestinal:  Denies nausea, heartburn or change in bowel habits Skin: Denies abnormal skin rashes Lymphatics: Denies new lymphadenopathy or easy bruising Neurological:Denies numbness, tingling or new weaknesses Behavioral/Psych: Mood is stable, no new changes  All other systems were reviewed with the patient and are negative.  PHYSICAL EXAMINATION: ECOG PERFORMANCE STATUS: 0 - Asymptomatic  Vitals:   05/28/21 1103  BP: (!) 164/78  Pulse: (!) 120  Resp: 19  Temp: (!) 97.1 F (36.2 C)  SpO2: 95%   Filed Weights   05/28/21 1103  Weight: 158 lb 6.4 oz (71.8 kg)    GENERAL:alert, no distress and comfortable SKIN: skin color, texture, turgor are normal, no rashes or significant lesions EYES: normal, conjunctiva are pink and non-injected, sclera clear OROPHARYNX:no exudate, no erythema and lips, buccal mucosa, and tongue normal  NECK: supple, thyroid normal size, non-tender, without nodularity LYMPH:  no palpable lymphadenopathy in the cervical, axillary or inguinal LUNGS: clear to auscultation and percussion with normal breathing effort HEART: regular rate & rhythm and no murmurs and no lower extremity edema ABDOMEN: Liver edge palpable 2 fingerbreadths below the right costal margin on deep inspiration.  It is tender to palpation.  No palpable splenomegaly. Musculoskeletal:no cyanosis of digits and no clubbing  PSYCH: alert & oriented x 3 with fluent  speech NEURO: no focal motor/sensory deficits  LABORATORY DATA:  I have reviewed the data as listed Lab Results  Component Value Date   WBC 2.8 (L) 05/28/2021   HGB 14.6 05/28/2021   HCT 43.2 05/28/2021   MCV 100.2 (H) 05/28/2021   PLT 87 (L) 05/28/2021     Chemistry      Component Value Date/Time   NA 145 (H) 11/13/2017 1228   K 3.5 11/13/2017 1228   CL 104 11/13/2017 1228   CO2 28 11/13/2017 1228   BUN 9 11/13/2017 1228   CREATININE 0.80 09/12/2019 1317   CREATININE 0.70 09/12/2014 1210      Component Value Date/Time   CALCIUM 9.2 11/13/2017 1228   ALKPHOS 99 11/13/2017 1228   AST 95 (H) 11/13/2017 1228   ALT 43 (H) 11/13/2017 1228  BILITOT 1.2 11/13/2017 1228       RADIOGRAPHIC STUDIES: I have personally reviewed the radiological images as listed and agreed with the findings in the report. No results found.  ASSESSMENT:  1.  Leukopenia and thrombocytopenia: - Patient seen at the request of Dr. Juel Burrow office. - CBC on 02/15/2021 with white count 2.4, ANC 1.3, platelet count 84.  Hemoglobin was normal at 15.1.  There was question of aggregation of platelets. - She has mildly decreased platelet count since 2016. - White count is decreased since 2018. - No B symptoms or recurrent infections. 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 - MRI of the abdomen on 09/12/2019 with hepatic steatosis, cirrhosis with stigmata of portal venous hypertension including borderline splenomegaly and varices.  2.  Social/family history: - She lives at home with her husband. - She smoked 1 and half pack per day for 40 years and quit in 2012. - She is currently working at [employer] daily.  She worked for a [employer] prior to that.  No history of chemical exposure. - No family history of low platelets.  Mother died of lung cancer.  Maternal aunt died of cancer.  PLAN:  1.  Leukopenia and thrombocytopenia: - We will repeat her CBC with differential  today along with reticulocyte count and LDH. - We will evaluate for nutritional deficiencies and connective tissue disorders. - We will also check for hepatitis B serology. - RTC 2 to 3 weeks.  2.  Elevated AFP: - Last AFP was 13.5 on 12/26/2017.  She stopped following up with hepatology. - Physical examination shows liver edge palpable 2 fingerbreadths below the right costal margin, tender. - MRI of the abdomen on 09/12/2019 showed T1 hyperintense lesion within the posterior dome of the right hepatic lobe, indeterminate. - Recommend repeating AFP and MRI of the abdomen.   Orders Placed This Encounter  Procedures   MR Abdomen W Wo Contrast    Standing Status:   Future    Standing Expiration Date:   05/29/2023    Order Specific Question:   If indicated for the ordered procedure, I authorize the administration of contrast media per Radiology protocol    Answer:   Yes    Order Specific Question:   What is the patient's sedation requirement?    Answer:   No Sedation    Order Specific Question:   Does the patient have a pacemaker or implanted devices?    Answer:   No    Order Specific Question:   Preferred imaging location?    Answer:   [facility] (table limit [phone])    Order Specific Question:   Release to patient    Answer:   Immediate   Hepatitis B surface antibody,qualitative   Hepatitis B surface antigen   Hepatitis B core antibody, total   AFP tumor marker   ANA, IFA (with reflex)   Rheumatoid factor   CBC with Differential/Platelet    Order Specific Question:   Release to patient    Answer:   Immediate   Reticulocytes   Lactate dehydrogenase   Methylmalonic acid, serum   Vitamin B12   Folate    Order Specific Question:   Release to patient    Answer:   Immediate   Copper, serum    Order Specific Question:   Release to patient    Answer:   Immediate    All questions were answered. The patient knows to  call the clinic with any problems, questions or  concerns.      Derek Jack, MD 05/28/2021 2:02 PM

## 2021-05-30 LAB — RHEUMATOID FACTOR: Rheumatoid fact SerPl-aCnc: 12.9 IU/mL (ref <14.0)

## 2021-05-30 LAB — COPPER, SERUM: Copper: 119 ug/dL (ref 80–158)

## 2021-05-30 LAB — AFP TUMOR MARKER: AFP, Serum, Tumor Marker: 9.8 ng/mL — ABNORMAL HIGH (ref 0.0–9.2)

## 2021-06-01 LAB — ANTINUCLEAR ANTIBODIES, IFA: ANA Ab, IFA: NEGATIVE

## 2021-06-03 LAB — METHYLMALONIC ACID, SERUM: Methylmalonic Acid, Quantitative: 282 nmol/L (ref 0–378)

## 2021-07-21 DIAGNOSIS — I10 Essential (primary) hypertension: Secondary | ICD-10-CM | POA: Diagnosis not present

## 2021-07-21 DIAGNOSIS — U071 COVID-19: Secondary | ICD-10-CM | POA: Diagnosis not present

## 2021-08-02 ENCOUNTER — Ambulatory Visit (HOSPITAL_COMMUNITY): Payer: Medicare HMO | Attending: Hematology

## 2021-08-03 NOTE — Progress Notes (Shared)
[place], [place] [address]   CLINIC:  Medical Oncology/Hematology  PCP:  Celene Squibb, MD [address] [place] [address]  [phone]  REASON FOR VISIT:  Follow-up for ***  PRIOR THERAPY: ***  CURRENT THERAPY: ***  INTERVAL HISTORY:  [name], a 70 y.o. female, returns for routine follow-up for her ***. Raven Lee was last seen on {XX/XX/XXXX}.   REVIEW OF SYSTEMS:  Review of Systems - Oncology  PAST MEDICAL/SURGICAL HISTORY:  Past Medical History:  Diagnosis Date   Difficulty sleeping    Emphysema/COPD ([place])    Hepatitis    HEPATITIS C   History of panic attacks    Hypertension    Psoriasis    Past Surgical History:  Procedure Laterality Date   ABDOMINAL HYSTERECTOMY     COLONOSCOPY  2009   [facility]: diverticulosis   LAPAROSCOPIC GASTRIC SLEEVE RESECTION N/A 12/01/2014   Procedure: LAPAROSCOPIC GASTRIC SLEEVE RESECTION UPPER ENDO;  Surgeon: Excell Seltzer, MD;  Location: [facility];  Service: General;  Laterality: N/A;   LUNG LOBECTOMY  03/2011   LLL-severe PNA    SOCIAL HISTORY:  Social History   Socioeconomic History   Marital status: Married    Spouse name: Not on file   Number of children: Not on file   Years of education: Not on file   Highest education level: Not on file  Occupational History   Occupation: Retired    Fish farm manager: [employer]  Tobacco Use   Smoking status: Former    Packs/day: 1.00    Years: 25.00    Pack years: 25.00    Types: Cigarettes    Quit date: 03/30/2011    Years since quitting: 10.3   Smokeless tobacco: Never  Vaping Use   Vaping Use: Never used  Substance and Sexual Activity   Alcohol use: Yes    Alcohol/week: 0.0 standard drinks    Comment: socially   Drug use: No   Sexual activity: Never  Other Topics Concern   Not on file  Social History Narrative   Not on file   Social Determinants of Health   Financial Resource Strain: Not on file  Food Insecurity:  Not on file  Transportation Needs: Not on file  Physical Activity: Not on file  Stress: Not on file  Social Connections: Not on file  Intimate Partner Violence: Not on file    FAMILY HISTORY:  Family History  Problem Relation Age of Onset   Lung cancer Mother        smoked   Heart disease Father    Heart attack Brother    Diabetes Brother    Stroke Brother    Obesity Other    Colon cancer Neg Hx    Liver disease Neg Hx     CURRENT MEDICATIONS:  Current Outpatient Medications  Medication Sig Dispense Refill   hydrochlorothiazide (HYDRODIURIL) 25 MG tablet Take 25 mg by mouth every evening.      metoprolol (LOPRESSOR) 50 MG tablet Take 50 mg by mouth 2 (two) times daily.      mirtazapine (REMERON) 30 MG tablet Take 30 mg by mouth at bedtime.      nystatin-triamcinolone ointment (MYCOLOG) Apply 1 application topically 2 (two) times daily. (Patient taking differently: Apply 1 application topically as needed.) 30 g 11   quinapril (ACCUPRIL) 40 MG tablet Take 40 mg by mouth every evening.      venlafaxine XR (EFFEXOR-XR) 150  MG 24 hr capsule Take 150 mg by mouth every evening.      No current facility-administered medications for this visit.    ALLERGIES:  Allergies  Allergen Reactions   Oxycodone Other (See Comments)    Hallucinations    PHYSICAL EXAM:  Performance status (ECOG): {CHL ONC YK:[id number]}  There were no vitals filed for this visit. Wt Readings from Last 3 Encounters:  05/28/21 158 lb 6.4 oz (71.8 kg)  04/12/18 170 lb (77.1 kg)  10/23/17 174 lb (78.9 kg)   Physical Exam  LABORATORY DATA:  I have reviewed the labs as listed.  CBC Latest Ref Rng & Units 05/28/2021 11/13/2017 05/11/2017  WBC 4.0 - 10.5 K/uL 2.8(L) 3.4 2.6(L)  Hemoglobin 12.0 - 15.0 g/dL 14.6 14.1 13.7  Hematocrit 36.0 - 46.0 % 43.2 41.1 40.9  Platelets 150 - 400 K/uL 87(L) 138(L) 117(L)   CMP Latest Ref Rng & Units 09/12/2019 11/13/2017 05/11/2017  Glucose 65 - 99 mg/dL - 63(L) 102(H)   BUN 8 - 27 mg/dL - 9 11  Creatinine 0.44 - 1.00 mg/dL 0.80 0.78 0.75  Sodium 134 - 144 mmol/L - 145(H) 143  Potassium 3.5 - 5.2 mmol/L - 3.5 3.6  Chloride 96 - 106 mmol/L - 104 103  CO2 20 - 29 mmol/L - 28 28  Calcium 8.7 - 10.3 mg/dL - 9.2 9.2  Total Protein 6.0 - 8.5 g/dL - 7.6 7.7  Total Bilirubin 0.0 - 1.2 mg/dL - 1.2 1.1  Alkaline Phos 39 - 117 IU/L - 99 85  AST 0 - 40 IU/L - 95(H) 95(H)  ALT 0 - 32 IU/L - 43(H) 46(H)      Component Value Date/Time   RBC 4.31 05/28/2021 1204   RBC 4.24 05/28/2021 1204   MCV 100.2 (H) 05/28/2021 1204   MCV 97 11/13/2017 1228   MCH 33.9 05/28/2021 1204   MCHC 33.8 05/28/2021 1204   RDW 13.3 05/28/2021 1204   RDW 14.6 11/13/2017 1228   LYMPHSABS 0.8 05/28/2021 1204   LYMPHSABS 1.2 11/13/2017 1228   MONOABS 0.4 05/28/2021 1204   EOSABS 0.0 05/28/2021 1204   EOSABS 0.0 11/13/2017 1228   BASOSABS 0.0 05/28/2021 1204   BASOSABS 0.0 11/13/2017 1228    DIAGNOSTIC IMAGING:  I have independently reviewed the scans and discussed with the patient. No results found.   ASSESSMENT:  ***   PLAN:  ***  Orders placed this encounter:  No orders of the defined types were placed in this encounter.    Derek Jack, MD [facility] [phone]   I, ***, am acting as a scribe for Dr. Derek Jack.  {Add Barista Statement}

## 2021-08-04 ENCOUNTER — Ambulatory Visit (HOSPITAL_COMMUNITY): Payer: Medicare HMO | Admitting: Hematology

## 2021-08-13 ENCOUNTER — Ambulatory Visit (HOSPITAL_COMMUNITY)
Admission: RE | Admit: 2021-08-13 | Discharge: 2021-08-13 | Disposition: A | Discharge status: Home / Self Care | Payer: Medicare HMO | Source: Ambulatory Visit | Attending: Hematology | Admitting: Hematology

## 2021-08-13 ENCOUNTER — Other Ambulatory Visit: Payer: Self-pay

## 2021-08-13 DIAGNOSIS — D696 Thrombocytopenia, unspecified: Secondary | ICD-10-CM | POA: Insufficient documentation

## 2021-08-13 DIAGNOSIS — K766 Portal hypertension: Secondary | ICD-10-CM | POA: Diagnosis not present

## 2021-08-13 DIAGNOSIS — D72819 Decreased white blood cell count, unspecified: Secondary | ICD-10-CM | POA: Diagnosis not present

## 2021-08-13 DIAGNOSIS — K7460 Unspecified cirrhosis of liver: Secondary | ICD-10-CM | POA: Diagnosis not present

## 2021-08-13 DIAGNOSIS — I8500 Esophageal varices without bleeding: Secondary | ICD-10-CM | POA: Diagnosis not present

## 2021-08-13 DIAGNOSIS — Z8505 Personal history of malignant neoplasm of liver: Secondary | ICD-10-CM | POA: Diagnosis not present

## 2021-08-13 MED ORDER — GADOBUTROL 1 MMOL/ML IV SOLN
7.0000 mL | Freq: Once | INTRAVENOUS | Status: AC | PRN
  Administered 2021-08-13: 7 mL via INTRAVENOUS

## 2021-08-24 NOTE — Progress Notes (Shared)
[place], [place] [address]   CLINIC:  Medical Oncology/Hematology  PCP:  Celene Squibb, MD [address] [place] [address]  [phone]  REASON FOR VISIT:  Follow-up for ***  PRIOR THERAPY: ***  CURRENT THERAPY: ***  INTERVAL HISTORY:  [name], a 70 y.o. female, returns for routine follow-up for her ***. Raven Lee was last seen on {XX/XX/XXXX}.   REVIEW OF SYSTEMS:  Review of Systems - Oncology  PAST MEDICAL/SURGICAL HISTORY:  Past Medical History:  Diagnosis Date   Difficulty sleeping    Emphysema/COPD ([place])    Hepatitis    HEPATITIS C   History of panic attacks    Hypertension    Psoriasis    Past Surgical History:  Procedure Laterality Date   ABDOMINAL HYSTERECTOMY     COLONOSCOPY  2009   [facility]: diverticulosis   LAPAROSCOPIC GASTRIC SLEEVE RESECTION N/A 12/01/2014   Procedure: LAPAROSCOPIC GASTRIC SLEEVE RESECTION UPPER ENDO;  Surgeon: Excell Seltzer, MD;  Location: [facility];  Service: General;  Laterality: N/A;   LUNG LOBECTOMY  03/2011   LLL-severe PNA    SOCIAL HISTORY:  Social History   Socioeconomic History   Marital status: Married    Spouse name: Not on file   Number of children: Not on file   Years of education: Not on file   Highest education level: Not on file  Occupational History   Occupation: Retired    Fish farm manager: [employer]  Tobacco Use   Smoking status: Former    Packs/day: 1.00    Years: 25.00    Pack years: 25.00    Types: Cigarettes    Quit date: 03/30/2011    Years since quitting: 10.4   Smokeless tobacco: Never  Vaping Use   Vaping Use: Never used  Substance and Sexual Activity   Alcohol use: Yes    Alcohol/week: 0.0 standard drinks    Comment: socially   Drug use: No   Sexual activity: Never  Other Topics Concern   Not on file  Social History Narrative   Not on file   Social Determinants of Health   Financial Resource Strain: Not on file  Food Insecurity:  Not on file  Transportation Needs: Not on file  Physical Activity: Not on file  Stress: Not on file  Social Connections: Not on file  Intimate Partner Violence: Not on file    FAMILY HISTORY:  Family History  Problem Relation Age of Onset   Lung cancer Mother        smoked   Heart disease Father    Heart attack Brother    Diabetes Brother    Stroke Brother    Obesity Other    Colon cancer Neg Hx    Liver disease Neg Hx     CURRENT MEDICATIONS:  Current Outpatient Medications  Medication Sig Dispense Refill   hydrochlorothiazide (HYDRODIURIL) 25 MG tablet Take 25 mg by mouth every evening.      metoprolol (LOPRESSOR) 50 MG tablet Take 50 mg by mouth 2 (two) times daily.      mirtazapine (REMERON) 30 MG tablet Take 30 mg by mouth at bedtime.      nystatin-triamcinolone ointment (MYCOLOG) Apply 1 application topically 2 (two) times daily. (Patient taking differently: Apply 1 application topically as needed.) 30 g 11   quinapril (ACCUPRIL) 40 MG tablet Take 40 mg by mouth every evening.      venlafaxine XR (EFFEXOR-XR) 150  MG 24 hr capsule Take 150 mg by mouth every evening.      No current facility-administered medications for this visit.    ALLERGIES:  Allergies  Allergen Reactions   Oxycodone Other (See Comments)    Hallucinations    PHYSICAL EXAM:  Performance status (ECOG): {CHL ONC KX:[id number]}  There were no vitals filed for this visit. Wt Readings from Last 3 Encounters:  05/28/21 158 lb 6.4 oz (71.8 kg)  04/12/18 170 lb (77.1 kg)  10/23/17 174 lb (78.9 kg)   Physical Exam  LABORATORY DATA:  I have reviewed the labs as listed.  CBC Latest Ref Rng & Units 05/28/2021 11/13/2017 05/11/2017  WBC 4.0 - 10.5 K/uL 2.8(L) 3.4 2.6(L)  Hemoglobin 12.0 - 15.0 g/dL 14.6 14.1 13.7  Hematocrit 36.0 - 46.0 % 43.2 41.1 40.9  Platelets 150 - 400 K/uL 87(L) 138(L) 117(L)   CMP Latest Ref Rng & Units 09/12/2019 11/13/2017 05/11/2017  Glucose 65 - 99 mg/dL - 63(L) 102(H)   BUN 8 - 27 mg/dL - 9 11  Creatinine 0.44 - 1.00 mg/dL 0.80 0.78 0.75  Sodium 134 - 144 mmol/L - 145(H) 143  Potassium 3.5 - 5.2 mmol/L - 3.5 3.6  Chloride 96 - 106 mmol/L - 104 103  CO2 20 - 29 mmol/L - 28 28  Calcium 8.7 - 10.3 mg/dL - 9.2 9.2  Total Protein 6.0 - 8.5 g/dL - 7.6 7.7  Total Bilirubin 0.0 - 1.2 mg/dL - 1.2 1.1  Alkaline Phos 39 - 117 IU/L - 99 85  AST 0 - 40 IU/L - 95(H) 95(H)  ALT 0 - 32 IU/L - 43(H) 46(H)      Component Value Date/Time   RBC 4.31 05/28/2021 1204   RBC 4.24 05/28/2021 1204   MCV 100.2 (H) 05/28/2021 1204   MCV 97 11/13/2017 1228   MCH 33.9 05/28/2021 1204   MCHC 33.8 05/28/2021 1204   RDW 13.3 05/28/2021 1204   RDW 14.6 11/13/2017 1228   LYMPHSABS 0.8 05/28/2021 1204   LYMPHSABS 1.2 11/13/2017 1228   MONOABS 0.4 05/28/2021 1204   EOSABS 0.0 05/28/2021 1204   EOSABS 0.0 11/13/2017 1228   BASOSABS 0.0 05/28/2021 1204   BASOSABS 0.0 11/13/2017 1228    DIAGNOSTIC IMAGING:  I have independently reviewed the scans and discussed with the patient. MR Abdomen W Wo Contrast  Result Date: 08/15/2021 CLINICAL DATA:  Cirrhosis. Screening for hepatocellular carcinoma. Follow-up liver lesion. EXAM: MRI ABDOMEN WITHOUT AND WITH CONTRAST TECHNIQUE: Multiplanar multisequence MR imaging of the abdomen was performed both before and after the administration of intravenous contrast. CONTRAST:  53mL GADAVIST GADOBUTROL 1 MMOL/ML IV SOLN COMPARISON:  MRI abdomen 09/12/2019 FINDINGS: Lower chest: No acute findings. Hepatobiliary: Mild diffuse hepatic steatosis. Contour of the liver is nodular and there is relative hypertrophy of the lateral segment of left hepatic lobe and caudate lobe. The index lesion within segment 7 measures 5.9 x 3.9 cm, image 10/6. This appears T2 hypointense and mildly T1 hyperintense. On the previous exam this measured 5.5 x 3.8 cm. There is no arterial phase enhancement or restricted diffusion identified within this structure, this is  compatible with LR-3 lesion, intermediate probability of malignancy. Along the cranial margin of this lesion there is a new focal area of arterial phase enhancement measuring 8 mm, image 27/15. Without washout on the delayed images. Compatible with LR-4 (intermediate probability for [facility] Within the inferior right hepatic lobe there is a new within segment 7 with arterial phase enhancement measuring 2.0  x 1.6 cm, image 52/15. On the delayed images this has a and equivocal enhancing capsule. There is mild restricted diffusion associated with this lesion. Within the central aspect of the right lobe of liver there is a 1.1 cm enhancing lesion without pseudo capsule or washout on the delayed images, image 46/5. There is mild restricted diffusion within this structure. In retrospect, this may have been present on the previous exam but blurred by motion artifact, and is likely unchanged in size. Gallbladder appears normal. Mild increase caliber of the CBD measures 7 mm. No signs of intrahepatic bile duct dilatation or choledocholithiasis Pancreas: No mass, inflammatory changes, or other parenchymal abnormality identified. Spleen: Mild splenomegaly. Spleen measures 13 cm cranial caudal. Formally 11.2 cm. No focal splenic abnormality. Adrenals/Urinary Tract: No masses identified. No evidence of hydronephrosis. Stomach/Bowel: Visualized portions within the abdomen are unremarkable. Vascular/Lymphatic: Aortic atherosclerosis. No aneurysm. Esophageal and gastric varices. No enlarged abdominopelvic lymph nodes. Other:  No ascites or focal fluid collections identified. Musculoskeletal: No suspicious bone lesions identified. IMPRESSION: 1. There are 2 new arterial phase enhancing lesions identified within the liver. Within inferior right hepatic lobe there is a new 2 cm arterial phase enhancing lesion compatible with LI-RADS category 5 (definite HCC.). Within segment 7 near the dome of liver there is a arterial phase enhancing  lesion measuring 8 mm, also new compatible with LI-RADS category 4 (probably [place]). 2. Within the central aspect of the right hepatic lobe there is a stable 1.1 cm arterial phase enhancing lesion with enhancement characteristics compatible with LI-RADS category 3 lesion (intermediate probability for [facility] 3. The previous large hypoenhancing lesion within segment 7 is mildly increased in size in the interval. This was previously characterized as a regenerative nodule. Enhancement characteristics are compatible with LI-RADS category 3 lesion (intermediate probability for [facility] 4. Morphologic features of the liver compatible with cirrhosis. Stigmata of portal venous hypertension including mild splenomegaly and upper abdominal varicosities. Electronically Signed   By: Kerby Moors M.D.   On: 08/15/2021 15:58     ASSESSMENT:  ***   PLAN:  ***  Orders placed this encounter:  No orders of the defined types were placed in this encounter.    Derek Jack, MD [facility] [phone]   I, ***, am acting as a scribe for Dr. Derek Jack.  {Add Barista Statement}

## 2021-08-25 ENCOUNTER — Inpatient Hospital Stay (HOSPITAL_COMMUNITY): Payer: Medicare HMO | Attending: Hematology | Admitting: Hematology

## 2021-08-25 ENCOUNTER — Other Ambulatory Visit: Payer: Self-pay

## 2021-08-25 ENCOUNTER — Inpatient Hospital Stay (HOSPITAL_COMMUNITY): Payer: Medicare HMO | Admitting: Hematology

## 2021-08-25 VITALS — BP 178/71 | HR 94 | Temp 98.2°F | Resp 18 | Ht 61.81 in | Wt 153.9 lb

## 2021-08-25 DIAGNOSIS — D72819 Decreased white blood cell count, unspecified: Secondary | ICD-10-CM | POA: Diagnosis not present

## 2021-08-25 DIAGNOSIS — K7460 Unspecified cirrhosis of liver: Secondary | ICD-10-CM | POA: Insufficient documentation

## 2021-08-25 DIAGNOSIS — K769 Liver disease, unspecified: Secondary | ICD-10-CM

## 2021-08-25 DIAGNOSIS — D696 Thrombocytopenia, unspecified: Secondary | ICD-10-CM | POA: Diagnosis not present

## 2021-08-25 DIAGNOSIS — K760 Fatty (change of) liver, not elsewhere classified: Secondary | ICD-10-CM | POA: Diagnosis not present

## 2021-08-25 DIAGNOSIS — R161 Splenomegaly, not elsewhere classified: Secondary | ICD-10-CM | POA: Diagnosis not present

## 2021-08-25 NOTE — Progress Notes (Signed)
[place],  [id number]   CLINIC:  Medical Oncology/Hematology  PCP:  Celene Squibb, MD [address] [place] Cro[place] [address]  [phone]  REASON FOR VISIT:  Follow-up for leukopenia and thrombocytopenia   PRIOR THERAPY: none  CURRENT THERAPY: not done  INTERVAL HISTORY:  [name], a 70 y.o. female, returns for routine follow-up for her leukopenia and thrombocytopenia. Adalie was last seen on 05/28/2021.  Today she reports feeling good.   REVIEW OF SYSTEMS:  Review of Systems  Constitutional:  Positive for fatigue (40%). Negative for appetite change.  All other systems reviewed and are negative.  PAST MEDICAL/SURGICAL HISTORY:  Past Medical History:  Diagnosis Date   Difficulty sleeping    Emphysema/COPD ([place])    Hepatitis    HEPATITIS C   History of panic attacks    Hypertension    Psoriasis    Past Surgical History:  Procedure Laterality Date   ABDOMINAL HYSTERECTOMY     COLONOSCOPY  2009   [facility]: diverticulosis   LAPAROSCOPIC GASTRIC SLEEVE RESECTION N/A 12/01/2014   Procedure: LAPAROSCOPIC GASTRIC SLEEVE RESECTION UPPER ENDO;  Surgeon: Excell Seltzer, MD;  Location: [facility];  Service: General;  Laterality: N/A;   LUNG LOBECTOMY  03/2011   LLL-severe PNA    SOCIAL HISTORY:  Social History   Socioeconomic History   Marital status: Married    Spouse name: Not on file   Number of children: Not on file   Years of education: Not on file   Highest education level: Not on file  Occupational History   Occupation: Retired    Fish farm manager: [employer]  Tobacco Use   Smoking status: Former    Packs/day: 1.00    Years: 25.00    Pack years: 25.00    Types: Cigarettes    Quit date: 03/30/2011    Years since quitting: 10.4   Smokeless tobacco: Never  Vaping Use   Vaping Use: Never used  Substance and Sexual Activity   Alcohol use: Yes    Alcohol/week: 0.0 standard drinks    Comment: socially   Drug use:  No   Sexual activity: Never  Other Topics Concern   Not on file  Social History Narrative   Not on file   Social Determinants of Health   Financial Resource Strain: Not on file  Food Insecurity: Not on file  Transportation Needs: Not on file  Physical Activity: Not on file  Stress: Not on file  Social Connections: Not on file  Intimate Partner Violence: Not on file    FAMILY HISTORY:  Family History  Problem Relation Age of Onset   Lung cancer Mother        smoked   Heart disease Father    Heart attack Brother    Diabetes Brother    Stroke Brother    Obesity Other    Colon cancer Neg Hx    Liver disease Neg Hx     CURRENT MEDICATIONS:  Current Outpatient Medications  Medication Sig Dispense Refill   hydrochlorothiazide (HYDRODIURIL) 25 MG tablet Take 25 mg by mouth every evening.      metoprolol (LOPRESSOR) 50 MG tablet Take 50 mg by mouth 2 (two) times daily.      mirtazapine (REMERON) 30 MG tablet Take 30 mg by mouth at bedtime.      nystatin-triamcinolone ointment (MYCOLOG) Apply 1 application topically 2 (two) times daily. (Patient taking differently: Apply 1 application  topically as needed.) 30 g 11   quinapril (ACCUPRIL) 40 MG tablet Take 40 mg by mouth every evening.      venlafaxine XR (EFFEXOR-XR) 150 MG 24 hr capsule Take 150 mg by mouth every evening.      No current facility-administered medications for this visit.    ALLERGIES:  Allergies  Allergen Reactions   Oxycodone Other (See Comments)    Hallucinations    PHYSICAL EXAM:  Performance status (ECOG): 0 - Asymptomatic  Vitals:   08/25/21 1451  BP: (!) 178/71  Pulse: 94  Resp: 18  Temp: 98.2 F (36.8 C)  SpO2: 98%   Wt Readings from Last 3 Encounters:  08/25/21 153 lb 14.1 oz (69.8 kg)  05/28/21 158 lb 6.4 oz (71.8 kg)  04/12/18 170 lb (77.1 kg)   Physical Exam Vitals reviewed.  Constitutional:      Appearance: Normal appearance.  Cardiovascular:     Rate and Rhythm: Normal rate  and regular rhythm.     Pulses: Normal pulses.     Heart sounds: Normal heart sounds.  Pulmonary:     Effort: Pulmonary effort is normal.     Breath sounds: Normal breath sounds.  Neurological:     General: No focal deficit present.     Mental Status: She is alert and oriented to person, place, and time.  Psychiatric:        Mood and Affect: Mood normal.        Behavior: Behavior normal.    LABORATORY DATA:  I have reviewed the labs as listed.  CBC Latest Ref Rng & Units 05/28/2021 11/13/2017 05/11/2017  WBC 4.0 - 10.5 K/uL 2.8(L) 3.4 2.6(L)  Hemoglobin 12.0 - 15.0 g/dL 14.6 14.1 13.7  Hematocrit 36.0 - 46.0 % 43.2 41.1 40.9  Platelets 150 - 400 K/uL 87(L) 138(L) 117(L)   CMP Latest Ref Rng & Units 09/12/2019 11/13/2017 05/11/2017  Glucose 65 - 99 mg/dL - 63(L) 102(H)  BUN 8 - 27 mg/dL - 9 11  Creatinine 0.44 - 1.00 mg/dL 0.80 0.78 0.75  Sodium 134 - 144 mmol/L - 145(H) 143  Potassium 3.5 - 5.2 mmol/L - 3.5 3.6  Chloride 96 - 106 mmol/L - 104 103  CO2 20 - 29 mmol/L - 28 28  Calcium 8.7 - 10.3 mg/dL - 9.2 9.2  Total Protein 6.0 - 8.5 g/dL - 7.6 7.7  Total Bilirubin 0.0 - 1.2 mg/dL - 1.2 1.1  Alkaline Phos 39 - 117 IU/L - 99 85  AST 0 - 40 IU/L - 95(H) 95(H)  ALT 0 - 32 IU/L - 43(H) 46(H)      Component Value Date/Time   RBC 4.31 05/28/2021 1204   RBC 4.24 05/28/2021 1204   MCV 100.2 (H) 05/28/2021 1204   MCV 97 11/13/2017 1228   MCH 33.9 05/28/2021 1204   MCHC 33.8 05/28/2021 1204   RDW 13.3 05/28/2021 1204   RDW 14.6 11/13/2017 1228   LYMPHSABS 0.8 05/28/2021 1204   LYMPHSABS 1.2 11/13/2017 1228   MONOABS 0.4 05/28/2021 1204   EOSABS 0.0 05/28/2021 1204   EOSABS 0.0 11/13/2017 1228   BASOSABS 0.0 05/28/2021 1204   BASOSABS 0.0 11/13/2017 1228    DIAGNOSTIC IMAGING:  I have independently reviewed the scans and discussed with the patient. MR Abdomen W Wo Contrast  Result Date: 08/15/2021 CLINICAL DATA:  Cirrhosis. Screening for hepatocellular carcinoma. Follow-up  liver lesion. EXAM: MRI ABDOMEN WITHOUT AND WITH CONTRAST TECHNIQUE: Multiplanar multisequence MR imaging of the abdomen was performed  both before and after the administration of intravenous contrast. CONTRAST:  65mL GADAVIST GADOBUTROL 1 MMOL/ML IV SOLN COMPARISON:  MRI abdomen 09/12/2019 FINDINGS: Lower chest: No acute findings. Hepatobiliary: Mild diffuse hepatic steatosis. Contour of the liver is nodular and there is relative hypertrophy of the lateral segment of left hepatic lobe and caudate lobe. The index lesion within segment 7 measures 5.9 x 3.9 cm, image 10/6. This appears T2 hypointense and mildly T1 hyperintense. On the previous exam this measured 5.5 x 3.8 cm. There is no arterial phase enhancement or restricted diffusion identified within this structure, this is compatible with LR-3 lesion, intermediate probability of malignancy. Along the cranial margin of this lesion there is a new focal area of arterial phase enhancement measuring 8 mm, image 27/15. Without washout on the delayed images. Compatible with LR-4 (intermediate probability for [facility] Within the inferior right hepatic lobe there is a new within segment 7 with arterial phase enhancement measuring 2.0 x 1.6 cm, image 52/15. On the delayed images this has a and equivocal enhancing capsule. There is mild restricted diffusion associated with this lesion. Within the central aspect of the right lobe of liver there is a 1.1 cm enhancing lesion without pseudo capsule or washout on the delayed images, image 46/5. There is mild restricted diffusion within this structure. In retrospect, this may have been present on the previous exam but blurred by motion artifact, and is likely unchanged in size. Gallbladder appears normal. Mild increase caliber of the CBD measures 7 mm. No signs of intrahepatic bile duct dilatation or choledocholithiasis Pancreas: No mass, inflammatory changes, or other parenchymal abnormality identified. Spleen: Mild splenomegaly.  Spleen measures 13 cm cranial caudal. Formally 11.2 cm. No focal splenic abnormality. Adrenals/Urinary Tract: No masses identified. No evidence of hydronephrosis. Stomach/Bowel: Visualized portions within the abdomen are unremarkable. Vascular/Lymphatic: Aortic atherosclerosis. No aneurysm. Esophageal and gastric varices. No enlarged abdominopelvic lymph nodes. Other:  No ascites or focal fluid collections identified. Musculoskeletal: No suspicious bone lesions identified. IMPRESSION: 1. There are 2 new arterial phase enhancing lesions identified within the liver. Within inferior right hepatic lobe there is a new 2 cm arterial phase enhancing lesion compatible with LI-RADS category 5 (definite HCC.). Within segment 7 near the dome of liver there is a arterial phase enhancing lesion measuring 8 mm, also new compatible with LI-RADS category 4 (probably [place]). 2. Within the central aspect of the right hepatic lobe there is a stable 1.1 cm arterial phase enhancing lesion with enhancement characteristics compatible with LI-RADS category 3 lesion (intermediate probability for [facility] 3. The previous large hypoenhancing lesion within segment 7 is mildly increased in size in the interval. This was previously characterized as a regenerative nodule. Enhancement characteristics are compatible with LI-RADS category 3 lesion (intermediate probability for [facility].). 4. Morphologic features of the liver compatible with cirrhosis. Stigmata of portal venous hypertension including mild splenomegaly and upper abdominal varicosities. Electronically Signed   By: Kerby Moors M.D.   On: 08/15/2021 15:58     ASSESSMENT:  1.  Leukopenia and thrombocytopenia: - Patient seen at the request of Dr. Juel Burrow office. - CBC on 02/15/2021 with white count 2.4, ANC 1.3, platelet count 84.  Hemoglobin was normal at 15.1.  There was question of aggregation of platelets. - She has mildly decreased platelet count since 2016. - White count is decreased  since 2018. - No B symptoms or recurrent infections. 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 - MRI of the abdomen  on 09/12/2019 with hepatic steatosis, cirrhosis with stigmata of portal venous hypertension including borderline splenomegaly and varices.  2.  Social/family history: - She lives at home with her husband. - She smoked 1 and half pack per day for 40 years and quit in 2012. - She is currently working at [employer] daily.  She worked for a [employer] prior to that.  No history of chemical exposure. - No family history of low platelets.  Mother died of lung cancer.  Maternal aunt died of cancer.   PLAN:  1.  Leukopenia and thrombocytopenia: - We have reviewed labs which showed normal B12, MMA, copper, folic acid. - She has mild to moderate thrombocytopenia with platelet count 87 and white count 2.8 with ANC of 1.6.  ANA and rheumatoid factor were normal.  Hepatitis B was negative. 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  We will monitor her counts.  She does not have any recurrent infections or bleeding issues.  No active intervention at this time.  2.  Liver lesions and elevated AFP: -AFP was 13.5 on 12/26/2017. - Current AFP is 9.8 on 05/28/2021. - Reviewed MRI of the abdomen with and without contrast from 08/13/2021.  2 new arterial phase enhancing lesions identified within the liver, largest 2 cm in the inferior right hepatic lobe compatible with LI-RADS 5.  There is a 8 mm lesion in the dome of the liver, also enhances in the arterial phase, Li RADS 4.  There are few other category 3 lesions.  Cirrhosis was seen.  Splenomegaly measuring 13 cm in the craniocaudal direction. - Given her history of treated hepatitis C, and high likelihood of HCC, I have recommended consultation with IR for discussion about liver directed therapy.  We will make a referral. - RTC 3 months for follow-up.  Orders placed this encounter:  No orders of the defined types were  placed in this encounter.    Derek Jack, MD [place] [phone]   I, Thana Ates, am acting as a scribe for Dr. Derek Jack.  I, Derek Jack MD, have reviewed the above documentation for accuracy and completeness, and I agree with the above.

## 2021-08-25 NOTE — Patient Instructions (Signed)
[place] at [facility] Discharge Instructions   You were seen and examined today by Dr. Delton Coombes.  He reviewed the results of your MRI - there are new spots on your liver that are suspicious for cancer.  We will refer you to Interventional Radiology to see what treatment options they may offer to you.  Return as scheduled in 3 months.     Thank you for choosing [place] at [facility] to provide your oncology and hematology care.  To afford each patient quality time with our provider, please arrive at least 15 minutes before your scheduled appointment time.   If you have a lab appointment with the [facility] please come in thru the Main Entrance and check in at the main information desk.  You need to re-schedule your appointment should you arrive 10 or more minutes late.  We strive to give you quality time with our providers, and arriving late affects you and other patients whose appointments are after yours.  Also, if you no show three or more times for appointments you may be dismissed from the clinic at the providers discretion.     Again, thank you for choosing [facility].  Our hope is that these requests will decrease the amount of time that you wait before being seen by our physicians.       _____________________________________________________________  Should you have questions after your visit to [facility], please contact our office at [phone] and follow the prompts.  Our office hours are 8:00 a.m. and 4:30 p.m. Monday - Friday.  Please note that voicemails left after 4:00 p.m. may not be returned until the following business day.  We are closed weekends and major holidays.  You do have access to a nurse 24-7, just call the main number to the clinic [phone] and do not press any options, hold on the line and a nurse will answer the phone.    For prescription refill requests, have your  pharmacy contact our office and allow 72 hours.    Due to Covid, you will need to wear a mask upon entering the hospital. If you do not have a mask, a mask will be given to you at the Main Entrance upon arrival. For doctor visits, patients may have 1 support person age 30 or older with them. For treatment visits, patients can not have anyone with them due to social distancing guidelines and our immunocompromised population.

## 2021-09-01 ENCOUNTER — Encounter: Payer: Self-pay | Admitting: *Deleted

## 2021-09-01 ENCOUNTER — Ambulatory Visit
Admission: RE | Admit: 2021-09-01 | Discharge: 2021-09-01 | Disposition: A | Discharge status: Home / Self Care | Payer: Medicare HMO | Source: Ambulatory Visit | Attending: Hematology | Admitting: Hematology

## 2021-09-01 DIAGNOSIS — K7689 Other specified diseases of liver: Secondary | ICD-10-CM | POA: Diagnosis not present

## 2021-09-01 DIAGNOSIS — K769 Liver disease, unspecified: Secondary | ICD-10-CM

## 2021-09-01 HISTORY — PX: IR RADIOLOGIST EVAL & MGMT: IMG5224

## 2021-09-01 NOTE — Consult Note (Addendum)
Chief Complaint: Patient was seen in consultation today for suspicious liver lesions and history of hepatitis C.  at the request of Katragadda,Sreedhar  Referring Physician(s): Katragadda,Sreedhar  History of Present Illness: Raven Lee is a 71 y.o. female with history of hepatitis C that was treated in 2019.  Patient has been having imaging surveillance for a hepatic lesion since 2019.  AFP has been mildly elevated measuring 13.5 three years ago and measuring 9.8 on 05/28/2021.  Patient was referred to Interventional Radiology due to new lesions seen on recent MRI of the abdomen.  There are 2 new arterial phase enhancing lesions identified within the liver including a 2 cm lesion in the right hepatic lobe that is compatible with a LI-RADS 5 lesion.  Past medical history is significant for leukopenia and thrombocytopenia.  Postsurgical history is significant for laparoscopic gastric sleeve procedure in 2016 and history of a left lower lobe lobectomy at [facility].  Currently, the patient is asymptomatic.  Her only complaint is mild tiredness.  She is currently working at [employer] and she is able to do all of her normal daily activities.  She denies fevers, chills, change in appetite, or weight change.  She has no GI or GU symptoms.  She denies abdominal pain or distention.  Past Medical History:  Diagnosis Date   Difficulty sleeping    Emphysema/COPD ([place])    Hepatitis    HEPATITIS C   History of panic attacks    Hypertension    Psoriasis     Past Surgical History:  Procedure Laterality Date   ABDOMINAL HYSTERECTOMY     COLONOSCOPY  2009   [facility]: diverticulosis   IR RADIOLOGIST EVAL & MGMT  09/01/2021   LAPAROSCOPIC GASTRIC SLEEVE RESECTION N/A 12/01/2014   Procedure: LAPAROSCOPIC GASTRIC SLEEVE RESECTION UPPER ENDO;  Surgeon: Excell Seltzer, MD;  Location: [facility];  Service: General;  Laterality: N/A;   LUNG LOBECTOMY  03/2011   LLL-severe PNA     Allergies: Oxycodone  Medications: Prior to Admission medications   Medication Sig Start Date End Date Taking? Authorizing Provider  hydrochlorothiazide (HYDRODIURIL) 25 MG tablet Take 25 mg by mouth every evening.  08/07/13   [provider]  metoprolol (LOPRESSOR) 50 MG tablet Take 50 mg by mouth 2 (two) times daily.  08/07/13   [provider]  mirtazapine (REMERON) 30 MG tablet Take 30 mg by mouth at bedtime.  08/07/13   [provider]  nystatin-triamcinolone ointment (MYCOLOG) Apply 1 application topically 2 (two) times daily. Patient taking differently: Apply 1 application topically as needed. 02/23/16   Florian Buff, MD  quinapril (ACCUPRIL) 40 MG tablet Take 40 mg by mouth every evening.  07/17/13   [provider]  venlafaxine XR (EFFEXOR-XR) 150 MG 24 hr capsule Take 150 mg by mouth every evening.  08/07/13   [provider]     Family History  Problem Relation Age of Onset   Lung cancer Mother        smoked   Heart disease Father    Heart attack Brother    Diabetes Brother    Stroke Brother    Obesity Other    Colon cancer Neg Hx    Liver disease Neg Hx     Social History   Socioeconomic History   Marital status: Married    Spouse name: Not on file   Number of children: Not on file   Years of education: Not on file   Highest  education level: Not on file  Occupational History   Occupation: Retired    Fish farm manager: [employer]  Tobacco Use   Smoking status: Former    Packs/day: 1.00    Years: 25.00    Pack years: 25.00    Types: Cigarettes    Quit date: 03/30/2011    Years since quitting: 10.4   Smokeless tobacco: Never  Vaping Use   Vaping Use: Never used  Substance and Sexual Activity   Alcohol use: Yes    Alcohol/week: 0.0 standard drinks    Comment: socially   Drug use: No   Sexual activity: Never  Other Topics Concern   Not on file  Social History Narrative   Not on file   Social Determinants of  Health   Financial Resource Strain: Not on file  Food Insecurity: Not on file  Transportation Needs: Not on file  Physical Activity: Not on file  Stress: Not on file  Social Connections: Not on file    ECOG Status: 0 - Asymptomatic  Review of Systems: A 12 point ROS discussed and pertinent positives are indicated in the HPI above.  All other systems are negative.  Review of Systems  Constitutional:  Positive for fatigue. Negative for chills and fever.  Respiratory: Negative.    Cardiovascular: Negative.   Gastrointestinal: Negative.   Genitourinary: Negative.   Neurological: Negative.    Vital Signs: BP (!) 187/86 (BP Location: Right Arm)    SpO2 95%   Physical Exam Constitutional:      Appearance: Normal appearance.  Cardiovascular:     Rate and Rhythm: Normal rate and regular rhythm.  Pulmonary:     Effort: Pulmonary effort is normal.     Breath sounds: Normal breath sounds.  Abdominal:     General: Abdomen is flat. Bowel sounds are normal. There is no distension.     Palpations: Abdomen is soft.     Tenderness: There is no abdominal tenderness. There is no guarding.  Neurological:     Mental Status: She is alert.    Mallampati Score: 2     Imaging: MR Abdomen W Wo Contrast  Result Date: 08/15/2021 CLINICAL DATA:  Cirrhosis. Screening for hepatocellular carcinoma. Follow-up liver lesion. EXAM: MRI ABDOMEN WITHOUT AND WITH CONTRAST TECHNIQUE: Multiplanar multisequence MR imaging of the abdomen was performed both before and after the administration of intravenous contrast. CONTRAST:  23mL GADAVIST GADOBUTROL 1 MMOL/ML IV SOLN COMPARISON:  MRI abdomen 09/12/2019 FINDINGS: Lower chest: No acute findings. Hepatobiliary: Mild diffuse hepatic steatosis. Contour of the liver is nodular and there is relative hypertrophy of the lateral segment of left hepatic lobe and caudate lobe. The index lesion within segment 7 measures 5.9 x 3.9 cm, image 10/6. This appears T2  hypointense and mildly T1 hyperintense. On the previous exam this measured 5.5 x 3.8 cm. There is no arterial phase enhancement or restricted diffusion identified within this structure, this is compatible with LR-3 lesion, intermediate probability of malignancy. Along the cranial margin of this lesion there is a new focal area of arterial phase enhancement measuring 8 mm, image 27/15. Without washout on the delayed images. Compatible with LR-4 (intermediate probability for [facility]). Within the inferior right hepatic lobe there is a new within segment 7 with arterial phase enhancement measuring 2.0 x 1.6 cm, image 52/15. On the delayed images this has a and equivocal enhancing capsule. There is mild restricted diffusion associated with this lesion. Within the central aspect of the right lobe of liver there is  a 1.1 cm enhancing lesion without pseudo capsule or washout on the delayed images, image 46/5. There is mild restricted diffusion within this structure. In retrospect, this may have been present on the previous exam but blurred by motion artifact, and is likely unchanged in size. Gallbladder appears normal. Mild increase caliber of the CBD measures 7 mm. No signs of intrahepatic bile duct dilatation or choledocholithiasis Pancreas: No mass, inflammatory changes, or other parenchymal abnormality identified. Spleen: Mild splenomegaly. Spleen measures 13 cm cranial caudal. Formally 11.2 cm. No focal splenic abnormality. Adrenals/Urinary Tract: No masses identified. No evidence of hydronephrosis. Stomach/Bowel: Visualized portions within the abdomen are unremarkable. Vascular/Lymphatic: Aortic atherosclerosis. No aneurysm. Esophageal and gastric varices. No enlarged abdominopelvic lymph nodes. Other:  No ascites or focal fluid collections identified. Musculoskeletal: No suspicious bone lesions identified. IMPRESSION: 1. There are 2 new arterial phase enhancing lesions identified within the liver. Within inferior right  hepatic lobe there is a new 2 cm arterial phase enhancing lesion compatible with LI-RADS category 5 (definite HCC.). Within segment 7 near the dome of liver there is a arterial phase enhancing lesion measuring 8 mm, also new compatible with LI-RADS category 4 (probably [place]). 2. Within the central aspect of the right hepatic lobe there is a stable 1.1 cm arterial phase enhancing lesion with enhancement characteristics compatible with LI-RADS category 3 lesion (intermediate probability for [facility] 3. The previous large hypoenhancing lesion within segment 7 is mildly increased in size in the interval. This was previously characterized as a regenerative nodule. Enhancement characteristics are compatible with LI-RADS category 3 lesion (intermediate probability for [facility] 4. Morphologic features of the liver compatible with cirrhosis. Stigmata of portal venous hypertension including mild splenomegaly and upper abdominal varicosities. Electronically Signed   By: Kerby Moors M.D.   On: 08/15/2021 15:58   IR Radiologist Eval & Mgmt  Result Date: 09/01/2021 Please refer to notes tab for details about interventional procedure. (Op Note)   Labs:  CBC: Recent Labs    05/28/21 1204  WBC 2.8*  HGB 14.6  HCT 43.2  PLT 87*    COAGS: No results for input(s): INR, APTT in the last 8760 hours.  BMP: No results for input(s): NA, K, CL, CO2, GLUCOSE, BUN, CALCIUM, CREATININE, GFRNONAA, GFRAA in the last 8760 hours.  Invalid input(s): CMP  LIVER FUNCTION TESTS: No results for input(s): BILITOT, AST, ALT, ALKPHOS, PROT, ALBUMIN in the last 8760 hours.  TUMOR MARKERS: No results for input(s): AFPTM, CEA, CA199, CHROMGRNA in the last 8760 hours.  Assessment and Plan:  71 year old with history of treated hepatitis C and evidence for cirrhosis based on imaging.  The patient has been under surveillance for a hepatic lesion since 2019.  Patient has a 5.9 cm hypoenhancing lesion in segment 7 that has slightly  increased over time and may represent a large regenerative nodule.  Recent MRI from 08/13/2021 demonstrates 3 enhancing lesions in the liver and there is a capsular lesion in the right hepatic lobe that measures 2 cm and concerning for an LI-RADS 5 lesion (definite hepatocellular carcinoma).  There is another small 0.8 cm lesion near the 5.9 cm lesion which is compatible with LI-RADS 4 lesion.  Reviewed imaging with the patient and discussed potential liver directed therapies which would include percutaneous ablation and catheter directed embolization.  At this time, the most concerning lesion is the slightly exophytic lesion in the right hepatic lobe measuring up to 2.0 cm.  This lesion could potentially be treated with image guided microwave  thermal ablation.  The other lesions are small and would likely need close surveillance.  Explained to the patient that she may need multiple treatments over time depending on what happens with the other small lesions.  We discussed image guided ablation in depth.  We discussed the use of general anesthesia and the risks of the procedure which include but not limited to bleeding, incomplete treatment and postprocedural pain particularly since the lesion is near the capsule.  Patient is agreeable to an image guided ablation of the 2 cm right hepatic lesion and we will start making arrangements for a potential ablation.  In the meantime, I would like to present the patient at the multidiscipline GI conference to review the imaging and discuss surgical/transplantation options.  Thank you for this interesting consult.  I greatly enjoyed meeting Cayce Paschal and look forward to participating in their care.  A copy of this report was sent to the requesting provider on this date.  Electronically Signed: Burman Riis 09/01/2021, 6:16 PM   I spent a total of  30 Minutes   in face to face in clinical consultation, greater than 50% of which was counseling/coordinating care for  liver lesions.

## 2021-09-02 ENCOUNTER — Other Ambulatory Visit (HOSPITAL_COMMUNITY): Payer: Self-pay | Admitting: Diagnostic Radiology

## 2021-09-02 DIAGNOSIS — K769 Liver disease, unspecified: Secondary | ICD-10-CM

## 2021-09-08 ENCOUNTER — Other Ambulatory Visit: Payer: Self-pay

## 2021-09-08 NOTE — Progress Notes (Signed)
The proposed treatment discussed in conference is for discussion purpose only and is not a binding recommendation.  The patients have not been physically examined, or presented with their treatment options.  Therefore, final treatment plans cannot be decided.  

## 2021-09-15 ENCOUNTER — Encounter (HOSPITAL_COMMUNITY): Payer: Self-pay | Admitting: Diagnostic Radiology

## 2021-09-15 NOTE — Progress Notes (Signed)
Patient ID: Raven Lee, female   DOB: Jun 19, 1951, 71 y.o.   MRN: [id number] Patient was discussed at GI tumor conference on 09/08/2020.  The consensus at the conference is that the patient should have a liver transplant evaluation prior to performing any liver directed therapy.  We have tried to arrange for the patient to follow-up with [facility] [place] in hepatology but reportedly her office has not been able to get a hold of the patient.  I have personally tried to call the patient on 2 occasions and left voicemails but have not heard back from the patient.  Patient is tentatively scheduled for microwave ablation on 10/14/2011 but patient needs the hematology/transport consultation prior to this procedure.  We will have to cancel or postpone this procedure if the patient has not seen by hematology/transplant service.

## 2021-09-16 NOTE — Progress Notes (Signed)
Surgery orders requested via Epic inbox. °

## 2021-09-16 NOTE — Progress Notes (Addendum)
COVID swab appointment: +Covid 07/21/21  COVID Vaccine Completed: Yes x2 Date COVID Vaccine completed: Has received booster:  Yes x2 COVID vaccine manufacturer: Moderna     Date of COVID positive in last 90 days: Yes 07/21/21  PCP - Allyn Kenner, MD (office note requested) Cardiologist - N/A  Chest x-ray - N/A EKG - 09-17-21 Epic Stress Test - greater than 10 years for surgery ECHO - N/A Cardiac Cath - N/A Pacemaker/ICD device last checked: Spinal Cord Stimulator:  Sleep Study - N/A CPAP -   Fasting Blood Sugar - N/A Checks Blood Sugar _____ times a day  Blood Thinner Instructions:  N/A Aspirin Instructions: Last Dose:  Activity level:   Can go up a flight of stairs and perform activities of daily living without stopping and without symptoms of chest pain or shortness of breath.    Anesthesia review:  COPD, Hep C, HTN  BP elevated at PAT, initially 188/84 and on recheck 177/79.  Patient states that she has white coat HTN and has not taken her medications today.  Denied chest pain, headache or SOB.  Patient denies shortness of breath, fever, cough and chest pain at PAT appointment   Patient verbalized understanding of instructions that were given to them at the PAT appointment. Patient was also instructed that they will need to review over the PAT instructions again at home before surgery.

## 2021-09-16 NOTE — Patient Instructions (Addendum)
DUE TO COVID-19 ONLY ONE VISITOR IS ALLOWED TO COME WITH YOU AND STAY IN THE WAITING ROOM ONLY DURING PRE OP AND PROCEDURE.   **NO VISITORS ARE ALLOWED IN THE SHORT STAY AREA OR RECOVERY ROOM!!**  IF YOU WILL BE ADMITTED INTO THE HOSPITAL YOU ARE ALLOWED ONLY TWO SUPPORT PEOPLE DURING VISITATION HOURS ONLY (7 AM -8PM)    Up to two visitors ages 29+ are allowed at one time in a patient's room.  The visitors may rotate out with other people throughout the day.  Additionally, up to two children between the ages of 83 and 59 are allowed and do not count toward the number of allowed visitors.  Children within this age range must be accompanied by an adult visitor.  One adult visitor may remain with the patient overnight and must be in the room by 8 PM.  You are not required to quarantine, however you are required to wear a well-fitted mask when you are out and around people not in your household.  Hand Hygiene often Do NOT share personal items Notify your provider if you are in close contact with someone who has COVID or you develop fever 100.4 or greater, new onset of sneezing, cough, sore throat, shortness of breath or body aches.   Your procedure is scheduled on:  Wednesday, 10-13-21   Report to [facility] Main  Entrance     Report to admitting at 7:00 AM   Call this number if you have problems the morning of surgery [phone]   Do not eat food or drink liquids :After Midnight.     Oral Hygiene is also important to reduce your risk of infection.                                    Remember - BRUSH YOUR TEETH THE MORNING OF SURGERY WITH YOUR REGULAR TOOTHPASTE   Do NOT smoke after Midnight   Take these medicines the morning of surgery with A SIP OF WATER:  Metoprolol   Stop all vitamins and herbal supplements a week before surgery             You may not have any metal on your body including hair pins, jewelry, and body piercing             Do not wear make-up, lotions,  powders, perfumes or deodorant  Do not wear nail polish including gel and S&S, artificial/acrylic nails, or any other type of covering on natural nails including finger and toenails. If you have artificial nails, gel coating, etc. that needs to be removed by a nail salon please have this removed prior to surgery or surgery may need to be canceled/ delayed if the surgeon/ anesthesia feels like they are unable to be safely monitored.   Do not shave  48 hours prior to surgery.   Do not bring valuables to the hospital. [place].   Contacts, dentures or bridgework may not be worn into surgery.   Bring small overnight bag day of surgery.  Special Instructions: Bring a copy of your healthcare power of attorney and living will documents the day of surgery if you haven't scanned them in before.  Please read over the following fact sheets you were given: IF YOU HAVE QUESTIONS ABOUT YOUR PRE OP INSTRUCTIONS PLEASE CALL [facility] - Preparing for Surgery Before surgery,  you can play an important role.  Because skin is not sterile, your skin needs to be as free of germs as possible.  You can reduce the number of germs on your skin by washing with CHG (chlorahexidine gluconate) soap before surgery.  CHG is an antiseptic cleaner which kills germs and bonds with the skin to continue killing germs even after washing. Please DO NOT use if you have an allergy to CHG or antibacterial soaps.  If your skin becomes reddened/irritated stop using the CHG and inform your nurse when you arrive at Short Stay. Do not shave (including legs and underarms) for at least 48 hours prior to the first CHG shower.  You may shave your face/neck.  Please follow these instructions carefully:  1.  Shower with CHG Soap the night before surgery and the  morning of surgery.  2.  If you choose to wash your hair, wash your hair first as usual with your normal  shampoo.  3.  After  you shampoo, rinse your hair and body thoroughly to remove the shampoo.                             4.  Use CHG as you would any other liquid soap.  You can apply chg directly to the skin and wash.  Gently with a scrungie or clean washcloth.  5.  Apply the CHG Soap to your body ONLY FROM THE NECK DOWN.   Do   not use on face/ open                           Wound or open sores. Avoid contact with eyes, ears mouth and   genitals (private parts).                       Wash face,  Genitals (private parts) with your normal soap.             6.  Wash thoroughly, paying special attention to the area where your    surgery  will be performed.  7.  Thoroughly rinse your body with warm water from the neck down.  8.  DO NOT shower/wash with your normal soap after using and rinsing off the CHG Soap.                9.  Pat yourself dry with a clean towel.            10.  Wear clean pajamas.            11.  Place clean sheets on your bed the night of your first shower and do not  sleep with pets. Day of Surgery : Do not apply any lotions/deodorants the morning of surgery.  Please wear clean clothes to the hospital/surgery center.  FAILURE TO FOLLOW THESE INSTRUCTIONS MAY RESULT IN THE CANCELLATION OF YOUR SURGERY  PATIENT SIGNATURE_________________________________  NURSE SIGNATURE__________________________________  ________________________________________________________________________

## 2021-09-17 ENCOUNTER — Encounter (HOSPITAL_COMMUNITY)
Admission: RE | Admit: 2021-09-17 | Discharge: 2021-09-17 | Disposition: A | Discharge status: Home / Self Care | Payer: Medicare HMO | Source: Ambulatory Visit | Attending: Diagnostic Radiology | Admitting: Diagnostic Radiology

## 2021-09-17 ENCOUNTER — Other Ambulatory Visit: Payer: Self-pay

## 2021-09-17 ENCOUNTER — Encounter (HOSPITAL_COMMUNITY): Payer: Self-pay

## 2021-09-17 ENCOUNTER — Other Ambulatory Visit: Payer: Self-pay | Admitting: Radiology

## 2021-09-17 VITALS — BP 177/79 | HR 84 | Temp 98.6°F | Resp 20 | Ht 62.0 in | Wt 150.4 lb

## 2021-09-17 DIAGNOSIS — Z01818 Encounter for other preprocedural examination: Secondary | ICD-10-CM | POA: Diagnosis not present

## 2021-09-17 DIAGNOSIS — K769 Liver disease, unspecified: Secondary | ICD-10-CM | POA: Diagnosis not present

## 2021-09-17 HISTORY — DX: Pneumonia, unspecified organism: J18.9

## 2021-09-17 HISTORY — DX: Anxiety disorder, unspecified: F41.9

## 2021-09-17 HISTORY — DX: Liver disease, unspecified: K76.9

## 2021-09-17 LAB — COMPREHENSIVE METABOLIC PANEL
ALT: 23 U/L (ref 0–44)
AST: 53 U/L — ABNORMAL HIGH (ref 15–41)
Albumin: 3.5 g/dL (ref 3.5–5.0)
Alkaline Phosphatase: 104 U/L (ref 38–126)
Anion gap: 7 (ref 5–15)
BUN: 10 mg/dL (ref 8–23)
CO2: 29 mmol/L (ref 22–32)
Calcium: 8.7 mg/dL — ABNORMAL LOW (ref 8.9–10.3)
Chloride: 103 mmol/L (ref 98–111)
Creatinine, Ser: 0.72 mg/dL (ref 0.44–1.00)
GFR, Estimated: >60 mL/min (ref >60)
Glucose, Bld: 159 mg/dL — ABNORMAL HIGH (ref 70–99)
Potassium: 3.5 mmol/L (ref 3.5–5.1)
Sodium: 139 mmol/L (ref 135–145)
Total Bilirubin: 1.6 mg/dL — ABNORMAL HIGH (ref 0.3–1.2)
Total Protein: 8 g/dL (ref 6.5–8.1)

## 2021-09-17 LAB — CBC WITH DIFFERENTIAL/PLATELET
Abs Immature Granulocytes: 0.01 10*3/uL (ref 0.00–0.07)
Basophils Absolute: 0 10*3/uL (ref 0.0–0.1)
Basophils Relative: 1 %
Eosinophils Absolute: 0 10*3/uL (ref 0.0–0.5)
Eosinophils Relative: 0 %
HCT: 41.8 % (ref 36.0–46.0)
Hemoglobin: 13.9 g/dL (ref 12.0–15.0)
Immature Granulocytes: 0 %
Lymphocytes Relative: 31 %
Lymphs Abs: 0.8 10*3/uL (ref 0.7–4.0)
MCH: 32.9 pg (ref 26.0–34.0)
MCHC: 33.3 g/dL (ref 30.0–36.0)
MCV: 99.1 fL (ref 80.0–100.0)
Monocytes Absolute: 0.4 10*3/uL (ref 0.1–1.0)
Monocytes Relative: 15 %
Neutro Abs: 1.3 10*3/uL — ABNORMAL LOW (ref 1.7–7.7)
Neutrophils Relative %: 53 %
Platelets: 95 10*3/uL — ABNORMAL LOW (ref 150–400)
RBC: 4.22 MIL/uL (ref 3.87–5.11)
RDW: 14.6 % (ref 11.5–15.5)
WBC: 2.5 10*3/uL — ABNORMAL LOW (ref 4.0–10.5)
nRBC: 0 % (ref 0.0–0.2)

## 2021-09-17 LAB — PROTIME-INR
INR: 1.2 (ref 0.8–1.2)
Prothrombin Time: 15 seconds (ref 11.4–15.2)

## 2021-09-17 NOTE — Progress Notes (Signed)
Patient in for PAT and needed medication list reconciled.  Attempted to call pharmacy tech multiple times and was on hold for over 6 minutes with no answer.   Patient was given the pharmacy tech number to call to go over medications.

## 2021-10-11 ENCOUNTER — Other Ambulatory Visit: Payer: Self-pay | Admitting: Radiology

## 2021-10-11 DIAGNOSIS — K766 Portal hypertension: Secondary | ICD-10-CM | POA: Diagnosis not present

## 2021-10-11 DIAGNOSIS — C220 Liver cell carcinoma: Secondary | ICD-10-CM | POA: Diagnosis not present

## 2021-10-11 DIAGNOSIS — K7469 Other cirrhosis of liver: Secondary | ICD-10-CM | POA: Diagnosis not present

## 2021-10-11 DIAGNOSIS — Z789 Other specified health status: Secondary | ICD-10-CM | POA: Diagnosis not present

## 2021-10-11 NOTE — H&P (Addendum)
Chief Complaint: Patient was seen in consultation today for percutaneous ablation of hepatic lesion with general anesthesia at the request of Henn,Adam  Referring Physician(s): Henn,Adam  Supervising Physician: Markus Daft  Patient Status: [facility] - Out-pt  History of Present Illness: Raven Lee is a 71 y.o. female w/ PMH of anxiety, insomnia, COPD, HCV, HTN, liver lesion, PNA and psoriasis. Pt has been under surveillance since 2019 for hepatic lesions. Pt was seen in consultation 09/15/2021 with Dr. Markus Daft to discuss treatment options for hepatic lesion. Pt presents today for percutaneous ablation and catheter directed embolization of hepatic lesion with general anesthesia.   Past Medical History:  Diagnosis Date   Anxiety    Difficulty sleeping    Emphysema/COPD ([place])    Hepatitis    HEPATITIS C   History of panic attacks    Hypertension    Liver lesion    Pneumonia    Psoriasis     Past Surgical History:  Procedure Laterality Date   ABDOMINAL HYSTERECTOMY     COLONOSCOPY  2009   [facility]: diverticulosis   IR RADIOLOGIST EVAL & MGMT  09/01/2021   LAPAROSCOPIC GASTRIC SLEEVE RESECTION N/A 12/01/2014   Procedure: LAPAROSCOPIC GASTRIC SLEEVE RESECTION UPPER ENDO;  Surgeon: Excell Seltzer, MD;  Location: [facility] ORS;  Service: General;  Laterality: N/A;   LUNG LOBECTOMY  03/2011   LLL-severe PNA    Allergies: Oxycodone  Medications: Prior to Admission medications   Medication Sig Start Date End Date Taking? Authorizing Provider  hydrochlorothiazide (HYDRODIURIL) 25 MG tablet Take 25 mg by mouth every evening.  08/07/13   [provider]  metoprolol (LOPRESSOR) 50 MG tablet Take 50 mg by mouth daily. 08/07/13   [provider]  mirtazapine (REMERON) 45 MG tablet Take 45 mg by mouth at bedtime. 09/02/21   [provider]  nicotine polacrilex (COMMIT) 2 MG lozenge Take 2 mg by mouth as needed (stress).    [provider]   nystatin-triamcinolone ointment (MYCOLOG) Apply 1 application topically 2 (two) times daily. Patient not taking: Reported on 09/27/2021 02/23/16   Florian Buff, MD  quinapril (ACCUPRIL) 40 MG tablet Take 40 mg by mouth every evening.  07/17/13   [provider]  venlafaxine XR (EFFEXOR-XR) 150 MG 24 hr capsule Take 150 mg by mouth every evening.  08/07/13   [provider]     Family History  Problem Relation Age of Onset   Lung cancer Mother        smoked   Heart disease Father    Heart attack Brother    Diabetes Brother    Stroke Brother    Obesity Other    Colon cancer Neg Hx    Liver disease Neg Hx     Social History   Socioeconomic History   Marital status: Married    Spouse name: Not on file   Number of children: Not on file   Years of education: Not on file   Highest education level: Not on file  Occupational History   Occupation: Retired    Fish farm manager: [employer]  Tobacco Use   Smoking status: Former    Packs/day: 1.00    Years: 25.00    Pack years: 25.00    Types: Cigarettes    Quit date: 03/30/2011    Years since quitting: 10.5   Smokeless tobacco: Never  Vaping Use   Vaping Use: Never used  Substance and Sexual Activity   Alcohol use: Yes  Alcohol/week: 0.0 standard drinks    Comment: socially   Drug use: No   Sexual activity: Never    Birth control/protection: Post-menopausal  Other Topics Concern   Not on file  Social History Narrative   Not on file   Social Determinants of Health   Financial Resource Strain: Not on file  Food Insecurity: Not on file  Transportation Needs: Not on file  Physical Activity: Not on file  Stress: Not on file  Social Connections: Not on file     Review of Systems: A 12 point ROS discussed and pertinent positives are indicated in the HPI above.  All other systems are negative.  Review of Systems  Constitutional:  Negative for appetite change, chills and fever.  HENT:  Negative for nosebleeds.    Eyes:  Negative for visual disturbance.  Respiratory:  Negative for cough and shortness of breath.   Cardiovascular:  Negative for chest pain and leg swelling.  Gastrointestinal:  Negative for abdominal pain, blood in stool, nausea and vomiting.  Genitourinary:  Negative for hematuria.  Musculoskeletal:  Negative for arthralgias.  Neurological:  Negative for dizziness, light-headedness and headaches.   Vital Signs: There were no vitals taken for this visit.  Physical Exam Constitutional:      Appearance: Normal appearance. She is not ill-appearing.  HENT:     Head: Normocephalic and atraumatic.     Mouth/Throat:     Mouth: Mucous membranes are moist.     Pharynx: Oropharynx is clear.  Eyes:     Extraocular Movements: Extraocular movements intact.     Pupils: Pupils are equal, round, and reactive to light.  Cardiovascular:     Rate and Rhythm: Normal rate and regular rhythm.     Pulses: Normal pulses.     Heart sounds: Normal heart sounds. No murmur heard.   No friction rub. No gallop.  Pulmonary:     Effort: Pulmonary effort is normal. No respiratory distress.     Breath sounds: Normal breath sounds. No stridor. No wheezing, rhonchi or rales.  Abdominal:     General: Bowel sounds are normal. There is no distension.     Palpations: Abdomen is soft.     Tenderness: There is no abdominal tenderness. There is no guarding.  Musculoskeletal:     Right lower leg: No edema.     Left lower leg: No edema.  Skin:    General: Skin is warm and dry.  Neurological:     Mental Status: She is alert and oriented to person, place, and time.  Psychiatric:        Mood and Affect: Mood normal.        Behavior: Behavior normal.        Thought Content: Thought content normal.        Judgment: Judgment normal.    Imaging: DG Chest 1 View  Result Date: 10/13/2021 CLINICAL DATA:  Pre ablation. EXAM: CHEST  1 VIEW COMPARISON:  10/20/2014. FINDINGS: Trachea is midline. Heart size stable.  Thoracic aorta is calcified. Biapical pleuroparenchymal scarring. Postoperative volume loss in the left hemithorax. Mild interstitial prominence and even slight nodularity. Chronic blunting of the left costophrenic angle. No definite pleural fluid. IMPRESSION: 1. Mild interstitial prominence and even slight nodularity. Consider CT chest without contrast in further evaluation, as clinically indicated. 2. Postoperative volume loss in the left hemithorax. Electronically Signed   By: Lorin Picket M.D.   On: 10/13/2021 08:30    Labs:  CBC: Recent Labs  05/28/21 1204 09/17/21 1313 10/13/21 0702  WBC 2.8* 2.5* 2.6*  HGB 14.6 13.9 15.1*  HCT 43.2 41.8 45.5  PLT 87* 95* 104*    COAGS: Recent Labs    09/17/21 1313  INR 1.2    BMP: Recent Labs    09/17/21 1313 10/13/21 0702  NA 139 138  K 3.5 3.6  CL 103 107  CO2 29 26  GLUCOSE 159* 91  BUN 10 8  CALCIUM 8.7* 8.7*  CREATININE 0.72 0.78  GFRNONAA >60 >60    LIVER FUNCTION TESTS: Recent Labs    09/17/21 1313 10/13/21 0702  BILITOT 1.6* 1.6*  AST 53* 56*  ALT 23 20  ALKPHOS 104 128*  PROT 8.0 8.3*  ALBUMIN 3.5 3.5    TUMOR MARKERS: No results for input(s): AFPTM, CEA, CA199, CHROMGRNA in the last 8760 hours.  Assessment and Plan: History of anxiety, insomnia, COPD, HCV, HTN, liver lesion, PNA and psoriasis. Pt has been under surveillance since 2019 for hepatic lesions. Pt was seen in consultation 09/15/2021 with Dr. Markus Daft to discuss treatment options for hepatic lesion. Pt presents today for percutaneous ablation and catheter directed embolization of hepatic lesion with general anesthesia.   Pt resting on stretcher. She is A&O, calm and pleasant. She is in no distress.  Pt states she is NPO per order.  She denies the use of blood thinning medications.  Today's labs pending.   Risks and benefits of percutaneous ablation of hepatic lesion with general anesthesia discussed with the patient including, but not  limited to bleeding, infection, liver failure, bile duct injury, pneumothorax or damage to adjacent structures.  All of the patient's questions were answered, patient is agreeable to proceed. Consent signed and in chart.   Thank you for this interesting consult. I greatly enjoyed meeting Raven Lee and look forward to participating in their care.  A copy of this report was sent to the requesting provider on this date.  Electronically Signed: Tyson Alias, NP 10/13/2021, 8:40 AM   I spent a total of 20 minutes in face to face in clinical consultation, greater than 50% of which was counseling/coordinating care for percutaneous ablation of hepatic lesion with general anesthesia.

## 2021-10-11 NOTE — H&P (Deleted)
  The note originally documented on this encounter has been moved the the encounter in which it belongs.  

## 2021-10-12 ENCOUNTER — Encounter: Payer: Self-pay | Admitting: Internal Medicine

## 2021-10-12 NOTE — Anesthesia Preprocedure Evaluation (Addendum)
Anesthesia Evaluation  Patient identified by MRN, date of birth, ID band Patient awake    Reviewed: Allergy & Precautions, H&P , NPO status , Patient's Chart, lab work & pertinent test results, reviewed documented beta blocker date and time   Airway Mallampati: II  TM Distance: >3 FB Neck ROM: Full    Dental no notable dental hx. (+) Edentulous Upper, Dental Advisory Given   Pulmonary shortness of breath, COPD, former smoker,    Pulmonary exam normal breath sounds clear to auscultation       Cardiovascular Exercise Tolerance: Good hypertension, Pt. on medications and Pt. on home beta blockers  Rhythm:Regular Rate:Normal     Neuro/Psych Anxiety negative neurological ROS     GI/Hepatic negative GI ROS, Neg liver ROS,   Endo/Other  negative endocrine ROS  Renal/GU negative Renal ROS  negative genitourinary   Musculoskeletal   Abdominal   Peds  Hematology negative hematology ROS (+)   Anesthesia Other Findings   Reproductive/Obstetrics negative OB ROS                            Anesthesia Physical Anesthesia Plan  ASA: 2  Anesthesia Plan: General   Post-op Pain Management: Tylenol PO (pre-op)*   Induction: Intravenous  PONV Risk Score and Plan: 4 or greater and Ondansetron, Dexamethasone and Treatment may vary due to age or medical condition  Airway Management Planned: Oral ETT  Additional Equipment:   Intra-op Plan:   Post-operative Plan: Extubation in OR  Informed Consent: I have reviewed the patients History and Physical, chart, labs and discussed the procedure including the risks, benefits and alternatives for the proposed anesthesia with the patient or authorized representative who has indicated his/her understanding and acceptance.     Dental advisory given  Plan Discussed with: CRNA  Anesthesia Plan Comments:        Anesthesia Quick Evaluation

## 2021-10-13 ENCOUNTER — Encounter (HOSPITAL_COMMUNITY)
Admission: RE | Disposition: A | Discharge status: Home / Self Care | Payer: Self-pay | Source: Home / Self Care | Attending: Diagnostic Radiology

## 2021-10-13 ENCOUNTER — Ambulatory Visit (HOSPITAL_COMMUNITY)
Admission: RE | Admit: 2021-10-13 | Discharge: 2021-10-13 | Disposition: A | Discharge status: Home / Self Care | Payer: Medicare HMO | Source: Ambulatory Visit | Attending: Diagnostic Radiology | Admitting: Diagnostic Radiology

## 2021-10-13 ENCOUNTER — Encounter (HOSPITAL_COMMUNITY): Payer: Self-pay

## 2021-10-13 ENCOUNTER — Ambulatory Visit (HOSPITAL_BASED_OUTPATIENT_CLINIC_OR_DEPARTMENT_OTHER): Payer: Medicare HMO | Admitting: Certified Registered Nurse Anesthetist

## 2021-10-13 ENCOUNTER — Ambulatory Visit (HOSPITAL_COMMUNITY): Payer: Medicare HMO

## 2021-10-13 ENCOUNTER — Ambulatory Visit (HOSPITAL_COMMUNITY): Payer: Medicare HMO | Admitting: Physician Assistant

## 2021-10-13 ENCOUNTER — Encounter (HOSPITAL_COMMUNITY): Payer: Self-pay | Admitting: Anesthesiology

## 2021-10-13 ENCOUNTER — Encounter (HOSPITAL_COMMUNITY): Payer: Self-pay | Admitting: Diagnostic Radiology

## 2021-10-13 ENCOUNTER — Ambulatory Visit (HOSPITAL_COMMUNITY)
Admission: RE | Admit: 2021-10-13 | Discharge: 2021-10-13 | Disposition: A | Discharge status: Home / Self Care | Payer: Medicare HMO | Attending: Diagnostic Radiology | Admitting: Diagnostic Radiology

## 2021-10-13 DIAGNOSIS — R918 Other nonspecific abnormal finding of lung field: Secondary | ICD-10-CM | POA: Diagnosis not present

## 2021-10-13 DIAGNOSIS — I10 Essential (primary) hypertension: Secondary | ICD-10-CM | POA: Insufficient documentation

## 2021-10-13 DIAGNOSIS — T41205A Adverse effect of unspecified general anesthetics, initial encounter: Secondary | ICD-10-CM | POA: Diagnosis not present

## 2021-10-13 DIAGNOSIS — K769 Liver disease, unspecified: Secondary | ICD-10-CM | POA: Diagnosis not present

## 2021-10-13 DIAGNOSIS — K7460 Unspecified cirrhosis of liver: Secondary | ICD-10-CM | POA: Diagnosis not present

## 2021-10-13 DIAGNOSIS — B1920 Unspecified viral hepatitis C without hepatic coma: Secondary | ICD-10-CM | POA: Diagnosis not present

## 2021-10-13 DIAGNOSIS — J984 Other disorders of lung: Secondary | ICD-10-CM | POA: Diagnosis not present

## 2021-10-13 DIAGNOSIS — C220 Liver cell carcinoma: Secondary | ICD-10-CM | POA: Insufficient documentation

## 2021-10-13 DIAGNOSIS — F419 Anxiety disorder, unspecified: Secondary | ICD-10-CM | POA: Diagnosis not present

## 2021-10-13 DIAGNOSIS — K7689 Other specified diseases of liver: Secondary | ICD-10-CM | POA: Diagnosis not present

## 2021-10-13 DIAGNOSIS — Z87891 Personal history of nicotine dependence: Secondary | ICD-10-CM | POA: Diagnosis not present

## 2021-10-13 DIAGNOSIS — R69 Illness, unspecified: Secondary | ICD-10-CM | POA: Diagnosis not present

## 2021-10-13 DIAGNOSIS — Z9889 Other specified postprocedural states: Secondary | ICD-10-CM | POA: Diagnosis not present

## 2021-10-13 DIAGNOSIS — L409 Psoriasis, unspecified: Secondary | ICD-10-CM | POA: Insufficient documentation

## 2021-10-13 DIAGNOSIS — Z01818 Encounter for other preprocedural examination: Secondary | ICD-10-CM

## 2021-10-13 DIAGNOSIS — J439 Emphysema, unspecified: Secondary | ICD-10-CM | POA: Insufficient documentation

## 2021-10-13 DIAGNOSIS — I700 Atherosclerosis of aorta: Secondary | ICD-10-CM | POA: Diagnosis not present

## 2021-10-13 HISTORY — PX: RADIOFREQUENCY ABLATION: SHX2290

## 2021-10-13 LAB — CBC WITH DIFFERENTIAL/PLATELET
Abs Immature Granulocytes: 0.02 10*3/uL (ref 0.00–0.07)
Basophils Absolute: 0 10*3/uL (ref 0.0–0.1)
Basophils Relative: 2 %
Eosinophils Absolute: 0 10*3/uL (ref 0.0–0.5)
Eosinophils Relative: 0 %
HCT: 45.5 % (ref 36.0–46.0)
Hemoglobin: 15.1 g/dL — ABNORMAL HIGH (ref 12.0–15.0)
Immature Granulocytes: 1 %
Lymphocytes Relative: 40 %
Lymphs Abs: 1.1 10*3/uL (ref 0.7–4.0)
MCH: 33.6 pg (ref 26.0–34.0)
MCHC: 33.2 g/dL (ref 30.0–36.0)
MCV: 101.1 fL — ABNORMAL HIGH (ref 80.0–100.0)
Monocytes Absolute: 0.5 10*3/uL (ref 0.1–1.0)
Monocytes Relative: 18 %
Neutro Abs: 1 10*3/uL — ABNORMAL LOW (ref 1.7–7.7)
Neutrophils Relative %: 39 %
Platelets: 104 10*3/uL — ABNORMAL LOW (ref 150–400)
RBC: 4.5 MIL/uL (ref 3.87–5.11)
RDW: 14.1 % (ref 11.5–15.5)
WBC: 2.6 10*3/uL — ABNORMAL LOW (ref 4.0–10.5)
nRBC: 0 % (ref 0.0–0.2)

## 2021-10-13 LAB — COMPREHENSIVE METABOLIC PANEL
ALT: 20 U/L (ref 0–44)
AST: 56 U/L — ABNORMAL HIGH (ref 15–41)
Albumin: 3.5 g/dL (ref 3.5–5.0)
Alkaline Phosphatase: 128 U/L — ABNORMAL HIGH (ref 38–126)
Anion gap: 5 (ref 5–15)
BUN: 8 mg/dL (ref 8–23)
CO2: 26 mmol/L (ref 22–32)
Calcium: 8.7 mg/dL — ABNORMAL LOW (ref 8.9–10.3)
Chloride: 107 mmol/L (ref 98–111)
Creatinine, Ser: 0.78 mg/dL (ref 0.44–1.00)
GFR, Estimated: >60 mL/min (ref >60)
Glucose, Bld: 91 mg/dL (ref 70–99)
Potassium: 3.6 mmol/L (ref 3.5–5.1)
Sodium: 138 mmol/L (ref 135–145)
Total Bilirubin: 1.6 mg/dL — ABNORMAL HIGH (ref 0.3–1.2)
Total Protein: 8.3 g/dL — ABNORMAL HIGH (ref 6.5–8.1)

## 2021-10-13 SURGERY — RADIO FREQUENCY ABLATION
Anesthesia: General

## 2021-10-13 MED ORDER — PHENYLEPHRINE HCL-NACL 20-0.9 MG/250ML-% IV SOLN
INTRAVENOUS | Status: DC | PRN
  Administered 2021-10-13: 50 ug/min via INTRAVENOUS

## 2021-10-13 MED ORDER — ACETAMINOPHEN 500 MG PO TABS
1000.0000 mg | ORAL_TABLET | Freq: Once | ORAL | Status: AC
  Administered 2021-10-13: 1000 mg via ORAL

## 2021-10-13 MED ORDER — PROPOFOL 10 MG/ML IV BOLUS
INTRAVENOUS | Status: DC | PRN
  Administered 2021-10-13: 110 mg via INTRAVENOUS

## 2021-10-13 MED ORDER — DEXAMETHASONE SODIUM PHOSPHATE 10 MG/ML IJ SOLN
INTRAMUSCULAR | Status: DC | PRN
  Administered 2021-10-13: 4 mg via INTRAVENOUS

## 2021-10-13 MED ORDER — ORAL CARE MOUTH RINSE: 15.0000 mL | Freq: Once | OROMUCOSAL | Status: AC

## 2021-10-13 MED ORDER — FENTANYL CITRATE (PF) 100 MCG/2ML IJ SOLN
INTRAMUSCULAR | Status: DC | PRN
  Administered 2021-10-13: 50 ug via INTRAVENOUS

## 2021-10-13 MED ORDER — LACTATED RINGERS IV SOLN: INTRAVENOUS | Status: DC

## 2021-10-13 MED ORDER — PHENYLEPHRINE HCL-NACL 20-0.9 MG/250ML-% IV SOLN
INTRAVENOUS | Status: AC
  Filled 2021-10-13: qty 250

## 2021-10-13 MED ORDER — ONDANSETRON HCL 4 MG/2ML IJ SOLN
INTRAMUSCULAR | Status: DC | PRN
Start: 2021-10-13 — End: 2021-10-13
  Administered 2021-10-13: 4 mg via INTRAVENOUS

## 2021-10-13 MED ORDER — FENTANYL CITRATE PF 50 MCG/ML IJ SOSY: 25.0000 ug | PREFILLED_SYRINGE | INTRAMUSCULAR | Status: DC | PRN

## 2021-10-13 MED ORDER — SODIUM CHLORIDE 0.9 % IV SOLN
2.0000 g | Freq: Once | INTRAVENOUS | Status: AC
  Administered 2021-10-13: 2 g via INTRAVENOUS
  Filled 2021-10-13: qty 2

## 2021-10-13 MED ORDER — IOHEXOL 300 MG/ML  SOLN
100.0000 mL | Freq: Once | INTRAMUSCULAR | Status: AC | PRN
  Administered 2021-10-13: 80 mL via INTRAVENOUS

## 2021-10-13 MED ORDER — ACETAMINOPHEN 500 MG PO TABS
ORAL_TABLET | ORAL | Status: AC
  Filled 2021-10-13: qty 2

## 2021-10-13 MED ORDER — FENTANYL CITRATE (PF) 250 MCG/5ML IJ SOLN
INTRAMUSCULAR | Status: AC
  Filled 2021-10-13: qty 5

## 2021-10-13 MED ORDER — VASOPRESSIN 20 UNIT/ML IV SOLN
INTRAVENOUS | Status: AC
  Filled 2021-10-13: qty 1

## 2021-10-13 MED ORDER — SUGAMMADEX SODIUM 200 MG/2ML IV SOLN
INTRAVENOUS | Status: DC | PRN
  Administered 2021-10-13: 400 mg via INTRAVENOUS

## 2021-10-13 MED ORDER — LIDOCAINE 2% (20 MG/ML) 5 ML SYRINGE
INTRAMUSCULAR | Status: DC | PRN
Start: 2021-10-13 — End: 2021-10-13
  Administered 2021-10-13: 60 mg via INTRAVENOUS

## 2021-10-13 MED ORDER — CHLORHEXIDINE GLUCONATE 0.12% ORAL RINSE (MEDLINE KIT)
15.0000 mL | Freq: Once | OROMUCOSAL | Status: AC
  Administered 2021-10-13: 15 mL via OROMUCOSAL

## 2021-10-13 MED ORDER — ROCURONIUM BROMIDE 10 MG/ML (PF) SYRINGE
PREFILLED_SYRINGE | INTRAVENOUS | Status: DC | PRN
  Administered 2021-10-13: 50 mg via INTRAVENOUS
  Administered 2021-10-13: 10 mg via INTRAVENOUS

## 2021-10-13 NOTE — Sedation Documentation (Signed)
Anesthesia in to sedate and monitor. 

## 2021-10-13 NOTE — Procedures (Signed)
Interventional Radiology Procedure:   Indications: Cirrhosis and LI-RADS 5 lesion in right hepatic lobe.  Planned for imaged guided microwave ablation.   Procedure: CT abdomen with and without contrast.  US guided liver lesion biopsy.  Microwave ablation not performed.  Findings: 2.1 cm hypoechoic lesion in right hepatic lobe adjacent to a right rib.  Lesion is only visible on CT during arterial phase contrast.  Did not feel that I could perform safe and adequate microwave ablation of this lesion without directly ablating the adjacent rib and abdominal wall soft tissues. As a result, microwave ablation was not attempted but felt that a biopsy was safe.  US guided liver lesion biopsy was performed in order to help make treatment decisions.  Complications: No immediate complications noted.     EBL: Minimal  Plan: Bedrest 3 hours, anticipate discharge today.    Cameo Shewell R. Anselm Pancoast, MD  Pager: [phone]

## 2021-10-13 NOTE — Anesthesia Procedure Notes (Signed)
Procedure Name: Intubation Date/Time: 10/13/2021 9:11 AM Performed by: Rosaland Lao, CRNA Pre-anesthesia Checklist: Patient identified, Emergency Drugs available, Suction available and Patient being monitored Patient Re-evaluated:Patient Re-evaluated prior to induction Oxygen Delivery Method: Circle system utilized Preoxygenation: Pre-oxygenation with 100% oxygen Induction Type: IV induction Ventilation: Mask ventilation without difficulty Laryngoscope Size: Miller and 2 Grade View: Grade I Tube type: Oral Tube size: 7.0 mm Number of attempts: 1 Airway Equipment and Method: Stylet Placement Confirmation: ETT inserted through vocal cords under direct vision, positive ETCO2 and breath sounds checked- equal and bilateral Secured at: 21 cm Tube secured with: Tape Dental Injury: Teeth and Oropharynx as per pre-operative assessment

## 2021-10-13 NOTE — Anesthesia Postprocedure Evaluation (Signed)
Anesthesia Post Note  Patient: Raven Lee  Procedure(s) Performed: CT MICROWAVE ABLATION     Patient location during evaluation: PACU Anesthesia Type: General Level of consciousness: awake and alert Pain management: pain level controlled Vital Signs Assessment: post-procedure vital signs reviewed and stable Respiratory status: spontaneous breathing, nonlabored ventilation and respiratory function stable Cardiovascular status: blood pressure returned to baseline and stable Postop Assessment: no apparent nausea or vomiting Anesthetic complications: no   No notable events documented.  Last Vitals:  Vitals:   10/13/21 1130 10/13/21 1146  BP: (!) 174/88   Pulse: 74   Resp: 17   Temp:  36.9 C  SpO2: 96%     Last Pain:  Vitals:   10/13/21 1146  TempSrc: Oral  PainSc: 0-No pain                 Areli Jowett,W. EDMOND

## 2021-10-13 NOTE — Transfer of Care (Signed)
Immediate Anesthesia Transfer of Care Note  Patient: Raven Lee  Procedure(s) Performed: CT MICROWAVE ABLATION  Patient Location: PACU  Anesthesia Type:General  Level of Consciousness: awake, alert  and oriented  Airway & Oxygen Therapy: Patient Spontanous Breathing  Post-op Assessment: Report given to RN and Post -op Vital signs reviewed and stable  Post vital signs: Reviewed and stable  Last Vitals:  Vitals Value Taken Time  BP 181/80 10/13/21 1102  Temp 36.9 C 10/13/21 1102  Pulse 78 10/13/21 1110  Resp 15 10/13/21 1110  SpO2 92 % 10/13/21 1110  Vitals shown include unvalidated device data.  Last Pain:  Vitals:   10/13/21 1102  TempSrc:   PainSc: 0-No pain         Complications: No notable events documented.

## 2021-10-13 NOTE — Procedures (Deleted)
  The note originally documented on this encounter has been moved the the encounter in which it belongs.  

## 2021-10-14 ENCOUNTER — Encounter (HOSPITAL_COMMUNITY): Payer: Self-pay | Admitting: Diagnostic Radiology

## 2021-10-14 LAB — SURGICAL PATHOLOGY

## 2021-10-20 ENCOUNTER — Other Ambulatory Visit: Payer: Self-pay | Admitting: Diagnostic Radiology

## 2021-10-20 ENCOUNTER — Other Ambulatory Visit: Payer: Self-pay

## 2021-10-20 DIAGNOSIS — K769 Liver disease, unspecified: Secondary | ICD-10-CM

## 2021-10-20 DIAGNOSIS — C220 Liver cell carcinoma: Secondary | ICD-10-CM

## 2021-10-20 NOTE — Progress Notes (Signed)
The proposed treatment discussed in conference is for discussion purpose only and is not a binding recommendation.  The patients have not been physically examined, or presented with their treatment options.  Therefore, final treatment plans cannot be decided.  

## 2021-10-21 ENCOUNTER — Ambulatory Visit (HOSPITAL_COMMUNITY)
Admission: RE | Admit: 2021-10-21 | Discharge: 2021-10-21 | Disposition: A | Discharge status: Home / Self Care | Payer: Medicare HMO | Source: Ambulatory Visit | Attending: Diagnostic Radiology | Admitting: Diagnostic Radiology

## 2021-10-21 ENCOUNTER — Other Ambulatory Visit: Payer: Self-pay

## 2021-10-21 ENCOUNTER — Other Ambulatory Visit: Payer: Self-pay | Admitting: Diagnostic Radiology

## 2021-10-21 DIAGNOSIS — K766 Portal hypertension: Secondary | ICD-10-CM | POA: Diagnosis not present

## 2021-10-21 DIAGNOSIS — C220 Liver cell carcinoma: Secondary | ICD-10-CM

## 2021-10-21 DIAGNOSIS — K7460 Unspecified cirrhosis of liver: Secondary | ICD-10-CM | POA: Diagnosis not present

## 2021-10-21 DIAGNOSIS — K7689 Other specified diseases of liver: Secondary | ICD-10-CM | POA: Diagnosis not present

## 2021-10-21 MED ORDER — GADOBUTROL 1 MMOL/ML IV SOLN
7.0000 mL | Freq: Once | INTRAVENOUS | Status: AC | PRN
  Administered 2021-10-21: 7 mL via INTRAVENOUS

## 2021-10-22 NOTE — Progress Notes (Signed)
Please place orders for PAT appointment scheduled 10/25/21.

## 2021-10-22 NOTE — Progress Notes (Addendum)
COVID swab appointment: 11/01/21 @ 1200  COVID Vaccine Completed: yes x3 Date COVID Vaccine completed: Has received booster: COVID vaccine manufacturer: [place]   Date of COVID positive in last 90 days: no  PCP - Allyn Kenner, MD Cardiologist - n/a  Chest x-ray - 10/13/21 Epic EKG - 09/17/21 Epic Stress Test - yes in 2011 at [facility] per pt ECHO - n/a Cardiac Cath - n/a Pacemaker/ICD device last checked: n/a Spinal Cord Stimulator: n/a  Bowel Prep - no  Sleep Study - n/a CPAP -   Fasting Blood Sugar -  n/a Checks Blood Sugar _____ times a day  Blood Thinner Instructions: n/a Aspirin Instructions: Last Dose:  Activity level: Can go up a flight of stairs and perform activities of daily living without stopping and without symptoms of chest pain or shortness of breath.   Anesthesia review: BP 198/92, pt states she is usually high in clinical setting. Takes medications in the evening. She also had an episode of emesis at PAT appointment. She denies any other symptoms. Stated emesis "came out of no where". She does not have a headache or feel nauseous. Per Janett Billow PA recommendation, if patient continues to have GI symptoms she is to contact her PCP.  Patient denies shortness of breath, fever, cough and chest pain at PAT appointment   Patient verbalized understanding of instructions that were given to them at the PAT appointment. Patient was also instructed that they will need to review over the PAT instructions again at home before surgery.

## 2021-10-22 NOTE — Patient Instructions (Addendum)
DUE TO COVID-19 ONLY ONE VISITOR IS ALLOWED TO COME WITH YOU AND STAY IN THE WAITING ROOM ONLY DURING PRE OP AND PROCEDURE.   **NO VISITORS ARE ALLOWED IN THE SHORT STAY AREA OR RECOVERY ROOM!!**  IF YOU WILL BE ADMITTED INTO THE HOSPITAL YOU ARE ALLOWED ONLY TWO SUPPORT PEOPLE DURING VISITATION HOURS ONLY (7 AM -8PM)   The support person(s) must pass our screening, gel in and out, and wear a mask at all times, including in the patients room. Patients must also wear a mask when staff or their support person are in the room. Visitors GUEST BADGE MUST BE WORN VISIBLY  One adult visitor may remain with you overnight and MUST be in the room by 8 P.M.  No visitors under the age of 4. Any visitor under the age of 82 must be accompanied by an adult.    COVID SWAB TESTING MUST BE COMPLETED ON:  11/01/21 @ 12:00 PM   Site: [facility] [place] [facility] [place] [place] Enter: Main Entrance have a seat in the waiting area to the right of main entrance (DO NOT [place]!!!!!) Dial: [phone] to alert staff you have arrived  You are not required to quarantine, however you are required to wear a well-fitted mask when you are out and around people not in your household.  Hand Hygiene often Do NOT share personal items Notify your provider if you are in close contact with someone who has COVID or you develop fever 100.4 or greater, new onset of sneezing, cough, sore throat, shortness of breath or body aches.       Your procedure is scheduled on: 11/03/21   Report to [facility] Main Entrance    Report to admitting at 9:45 AM   Call this number if you have problems the morning of surgery [phone]   Do not eat food :After Midnight.   After Midnight you may have the following liquids until 9:00 AM DAY OF SURGERY  Water Black Coffee (sugar ok, NO MILK/CREAM OR CREAMERS)  Tea (sugar ok, NO MILK/CREAM OR CREAMERS) regular and decaf                             Plain  Jell-O (NO RED)                                           Fruit ices (not with fruit pulp, NO RED)                                     Popsicles (NO RED)                                                                  Juice: apple, WHITE grape, WHITE cranberry Sports drinks like Gatorade (NO RED) Clear broth(vegetable,chicken,beef)  FOLLOW BOWEL PREP AND ANY ADDITIONAL PRE OP INSTRUCTIONS YOU RECEIVED FROM YOUR SURGEON'S OFFICE!!!     Oral Hygiene is also important to reduce your risk of infection.  Remember - BRUSH YOUR TEETH THE MORNING OF SURGERY WITH YOUR REGULAR TOOTHPASTE   Stop all vitamins and supplements 7 days before surgery.   Take these medicines the morning of surgery with A SIP OF WATER: Metoprolol                              You may not have any metal on your body including hair pins, jewelry, and body piercing             Do not wear make-up, lotions, powders, perfumes, or deodorant  Do not wear nail polish including gel and S&S, artificial/acrylic nails, or any other type of covering on natural nails including finger and toenails. If you have artificial nails, gel coating, etc. that needs to be removed by a nail salon please have this removed prior to surgery or surgery may need to be canceled/ delayed if the surgeon/ anesthesia feels like they are unable to be safely monitored.   Do not shave  48 hours prior to surgery.    Do not bring valuables to the hospital. [place].   Contacts, dentures or bridgework may not be worn into surgery.   Bring small overnight bag day of surgery.   Special Instructions: Bring a copy of your healthcare power of attorney and living will documents         the day of surgery if you haven't scanned them before.              Please read over the following fact sheets you were given: IF YOU HAVE QUESTIONS ABOUT YOUR PRE-OP INSTRUCTIONS PLEASE CALL  [facility] - Preparing for Surgery Before surgery, you can play an important role.  Because skin is not sterile, your skin needs to be as free of germs as possible.  You can reduce the number of germs on your skin by washing with CHG (chlorahexidine gluconate) soap before surgery.  CHG is an antiseptic cleaner which kills germs and bonds with the skin to continue killing germs even after washing. Please DO NOT use if you have an allergy to CHG or antibacterial soaps.  If your skin becomes reddened/irritated stop using the CHG and inform your nurse when you arrive at Short Stay. Do not shave (including legs and underarms) for at least 48 hours prior to the first CHG shower.  You may shave your face/neck.  Please follow these instructions carefully:  1.  Shower with CHG Soap the night before surgery and the  morning of surgery.  2.  If you choose to wash your hair, wash your hair first as usual with your normal  shampoo.  3.  After you shampoo, rinse your hair and body thoroughly to remove the shampoo.                             4.  Use CHG as you would any other liquid soap.  You can apply chg directly to the skin and wash.  Gently with a scrungie or clean washcloth.  5.  Apply the CHG Soap to your body ONLY FROM THE NECK DOWN.   Do   not use on face/ open  Wound or open sores. Avoid contact with eyes, ears mouth and   genitals (private parts).                       Wash face,  Genitals (private parts) with your normal soap.             6.  Wash thoroughly, paying special attention to the area where your    surgery  will be performed.  7.  Thoroughly rinse your body with warm water from the neck down.  8.  DO NOT shower/wash with your normal soap after using and rinsing off the CHG Soap.                9.  Pat yourself dry with a clean towel.            10.  Wear clean pajamas.            11.  Place clean sheets on your bed the night of your  first shower and do not  sleep with pets. Day of Surgery : Do not apply any lotions/deodorants the morning of surgery.  Please wear clean clothes to the hospital/surgery center.  FAILURE TO FOLLOW THESE INSTRUCTIONS MAY RESULT IN THE CANCELLATION OF YOUR SURGERY  PATIENT SIGNATURE_________________________________  NURSE SIGNATURE__________________________________  ________________________________________________________________________

## 2021-10-25 ENCOUNTER — Other Ambulatory Visit: Payer: Self-pay

## 2021-10-25 ENCOUNTER — Encounter (HOSPITAL_COMMUNITY): Payer: Self-pay

## 2021-10-25 ENCOUNTER — Other Ambulatory Visit: Payer: Self-pay | Admitting: Radiology

## 2021-10-25 ENCOUNTER — Encounter (HOSPITAL_COMMUNITY)
Admission: RE | Admit: 2021-10-25 | Discharge: 2021-10-25 | Disposition: A | Discharge status: Home / Self Care | Payer: Medicare HMO | Source: Ambulatory Visit | Attending: Diagnostic Radiology | Admitting: Diagnostic Radiology

## 2021-10-25 DIAGNOSIS — Z79899 Other long term (current) drug therapy: Secondary | ICD-10-CM | POA: Insufficient documentation

## 2021-10-25 DIAGNOSIS — Z01812 Encounter for preprocedural laboratory examination: Secondary | ICD-10-CM | POA: Insufficient documentation

## 2021-10-25 DIAGNOSIS — D490 Neoplasm of unspecified behavior of digestive system: Secondary | ICD-10-CM | POA: Diagnosis not present

## 2021-10-25 LAB — CBC WITH DIFFERENTIAL/PLATELET
Abs Immature Granulocytes: 0.02 10*3/uL (ref 0.00–0.07)
Basophils Absolute: 0.1 10*3/uL (ref 0.0–0.1)
Basophils Relative: 1 %
Eosinophils Absolute: 0 10*3/uL (ref 0.0–0.5)
Eosinophils Relative: 0 %
HCT: 45.6 % (ref 36.0–46.0)
Hemoglobin: 15.4 g/dL — ABNORMAL HIGH (ref 12.0–15.0)
Immature Granulocytes: 1 %
Lymphocytes Relative: 33 %
Lymphs Abs: 1.3 10*3/uL (ref 0.7–4.0)
MCH: 33.6 pg (ref 26.0–34.0)
MCHC: 33.8 g/dL (ref 30.0–36.0)
MCV: 99.6 fL (ref 80.0–100.0)
Monocytes Absolute: 0.5 10*3/uL (ref 0.1–1.0)
Monocytes Relative: 13 %
Neutro Abs: 2 10*3/uL (ref 1.7–7.7)
Neutrophils Relative %: 52 %
Platelets: 104 10*3/uL — ABNORMAL LOW (ref 150–400)
RBC: 4.58 MIL/uL (ref 3.87–5.11)
RDW: 13.4 % (ref 11.5–15.5)
WBC: 3.9 10*3/uL — ABNORMAL LOW (ref 4.0–10.5)
nRBC: 0 % (ref 0.0–0.2)

## 2021-10-25 LAB — COMPREHENSIVE METABOLIC PANEL
ALT: 22 U/L (ref 0–44)
AST: 49 U/L — ABNORMAL HIGH (ref 15–41)
Albumin: 3.7 g/dL (ref 3.5–5.0)
Alkaline Phosphatase: 119 U/L (ref 38–126)
Anion gap: 5 (ref 5–15)
BUN: 10 mg/dL (ref 8–23)
CO2: 29 mmol/L (ref 22–32)
Calcium: 9 mg/dL (ref 8.9–10.3)
Chloride: 105 mmol/L (ref 98–111)
Creatinine, Ser: 0.81 mg/dL (ref 0.44–1.00)
GFR, Estimated: >60 mL/min (ref >60)
Glucose, Bld: 106 mg/dL — ABNORMAL HIGH (ref 70–99)
Potassium: 3.9 mmol/L (ref 3.5–5.1)
Sodium: 139 mmol/L (ref 135–145)
Total Bilirubin: 1.7 mg/dL — ABNORMAL HIGH (ref 0.3–1.2)
Total Protein: 8.4 g/dL — ABNORMAL HIGH (ref 6.5–8.1)

## 2021-10-25 LAB — PROTIME-INR
INR: 1.1 (ref 0.8–1.2)
Prothrombin Time: 14.2 seconds (ref 11.4–15.2)

## 2021-10-29 ENCOUNTER — Encounter (HOSPITAL_COMMUNITY): Payer: Self-pay | Admitting: Diagnostic Radiology

## 2021-10-29 NOTE — Progress Notes (Signed)
Patient ID: Raven Lee, female   DOB: 1950/09/02, 71 y.o.   MRN: [id number] ?Discussed the patient's liver biopsy results on the telephone.  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  Patient was also discussed GI tumor conference.  General surgery does not feel the patient is a good candidate for resection due to the portal hypertension.  Options for treatment include percutaneous ablation, laparoscopic ablation, SBRT and catheter directed embolization.  I obtained a follow-up MRI to exclude progression of disease because there are a total of 3 hypervascular liver lesions.  The biopsy-proven 2.1 cm lesion in the right hepatic lobe has not significantly changed and the other 2 small lesions remain intermediate probability for [place], LI-RADS 3.  I discussed the treatment options with the patient and feel that ablation would offer the best treatment of the 2.1 cm subcapsular lesion.  However, I did explain that the location of the lesion makes it difficult for ablation due to overlying ribs and concern for postprocedural pain due to treatment of the capsule and adjacent intercostal structures.  I have reviewed the procedure with my partner Dr. Kathlene Cote and we feel that percutaneous ablation is technically feasible and will likely require significant hydro dissection in order to separate the liver from the adjacent ribs.  I reviewed the treatment options with the patient and she is agreeable to undergo another attempted percutaneous ablation.  This procedure has been scheduled for 11/03/2021. ? ?

## 2021-11-01 ENCOUNTER — Encounter (HOSPITAL_COMMUNITY): Payer: Medicare HMO

## 2021-11-02 ENCOUNTER — Other Ambulatory Visit: Payer: Self-pay

## 2021-11-02 ENCOUNTER — Encounter (HOSPITAL_COMMUNITY)
Admission: RE | Admit: 2021-11-02 | Discharge: 2021-11-02 | Disposition: A | Discharge status: Home / Self Care | Payer: Medicare HMO | Source: Ambulatory Visit | Attending: Diagnostic Radiology | Admitting: Diagnostic Radiology

## 2021-11-02 ENCOUNTER — Other Ambulatory Visit: Payer: Self-pay | Admitting: Internal Medicine

## 2021-11-02 LAB — SARS CORONAVIRUS 2 (TAT 6-24 HRS): SARS Coronavirus 2: NEGATIVE

## 2021-11-02 NOTE — Progress Notes (Signed)
Has follow up 11/23/21

## 2021-11-03 ENCOUNTER — Ambulatory Visit (HOSPITAL_COMMUNITY)
Admission: RE | Admit: 2021-11-03 | Discharge: 2021-11-03 | Disposition: A | Discharge status: Home / Self Care | Payer: Medicare HMO | Source: Ambulatory Visit | Attending: Diagnostic Radiology | Admitting: Diagnostic Radiology

## 2021-11-03 ENCOUNTER — Other Ambulatory Visit: Payer: Self-pay | Admitting: Physician Assistant

## 2021-11-03 ENCOUNTER — Ambulatory Visit (HOSPITAL_COMMUNITY): Payer: Medicare HMO | Admitting: Physician Assistant

## 2021-11-03 ENCOUNTER — Encounter (HOSPITAL_COMMUNITY): Payer: Self-pay | Admitting: Diagnostic Radiology

## 2021-11-03 ENCOUNTER — Ambulatory Visit (HOSPITAL_BASED_OUTPATIENT_CLINIC_OR_DEPARTMENT_OTHER): Payer: Medicare HMO | Admitting: Registered Nurse

## 2021-11-03 ENCOUNTER — Inpatient Hospital Stay (HOSPITAL_COMMUNITY)
Admission: RE | Admit: 2021-11-03 | Discharge: 2021-11-03 | Disposition: A | Discharge status: Home / Self Care | Payer: Medicare HMO | Source: Ambulatory Visit | Attending: Diagnostic Radiology | Admitting: Diagnostic Radiology

## 2021-11-03 ENCOUNTER — Ambulatory Visit (HOSPITAL_COMMUNITY)
Admission: RE | Disposition: A | Discharge status: Home / Self Care | Payer: Self-pay | Source: Ambulatory Visit | Attending: Diagnostic Radiology

## 2021-11-03 DIAGNOSIS — C220 Liver cell carcinoma: Secondary | ICD-10-CM | POA: Diagnosis not present

## 2021-11-03 DIAGNOSIS — Z8619 Personal history of other infectious and parasitic diseases: Secondary | ICD-10-CM | POA: Insufficient documentation

## 2021-11-03 DIAGNOSIS — F419 Anxiety disorder, unspecified: Secondary | ICD-10-CM | POA: Insufficient documentation

## 2021-11-03 DIAGNOSIS — Z01812 Encounter for preprocedural laboratory examination: Secondary | ICD-10-CM

## 2021-11-03 DIAGNOSIS — J439 Emphysema, unspecified: Secondary | ICD-10-CM | POA: Diagnosis not present

## 2021-11-03 DIAGNOSIS — Z79899 Other long term (current) drug therapy: Secondary | ICD-10-CM | POA: Insufficient documentation

## 2021-11-03 DIAGNOSIS — Z87891 Personal history of nicotine dependence: Secondary | ICD-10-CM | POA: Diagnosis not present

## 2021-11-03 DIAGNOSIS — I10 Essential (primary) hypertension: Secondary | ICD-10-CM | POA: Insufficient documentation

## 2021-11-03 DIAGNOSIS — R69 Illness, unspecified: Secondary | ICD-10-CM | POA: Diagnosis not present

## 2021-11-03 HISTORY — PX: RADIOFREQUENCY ABLATION: SHX2290

## 2021-11-03 LAB — ABO/RH: ABO/RH(D): O POS

## 2021-11-03 LAB — TYPE AND SCREEN
ABO/RH(D): O POS
Antibody Screen: NEGATIVE

## 2021-11-03 SURGERY — RADIO FREQUENCY ABLATION
Anesthesia: General

## 2021-11-03 MED ORDER — EPHEDRINE SULFATE-NACL 50-0.9 MG/10ML-% IV SOSY
PREFILLED_SYRINGE | INTRAVENOUS | Status: DC | PRN
  Administered 2021-11-03 (×3): 5 mg via INTRAVENOUS

## 2021-11-03 MED ORDER — ALBUTEROL SULFATE (2.5 MG/3ML) 0.083% IN NEBU
2.5000 mg | INHALATION_SOLUTION | Freq: Once | RESPIRATORY_TRACT | Status: AC
  Administered 2021-11-03: 2.5 mg via RESPIRATORY_TRACT

## 2021-11-03 MED ORDER — PHENYLEPHRINE HCL-NACL 20-0.9 MG/250ML-% IV SOLN
INTRAVENOUS | Status: AC
  Filled 2021-11-03: qty 250

## 2021-11-03 MED ORDER — FENTANYL CITRATE PF 50 MCG/ML IJ SOSY: 25.0000 ug | PREFILLED_SYRINGE | INTRAMUSCULAR | Status: DC | PRN

## 2021-11-03 MED ORDER — HYDROCODONE-ACETAMINOPHEN 5-325 MG PO TABS: 1.0000 | ORAL_TABLET | ORAL | 0 refills | Status: DC | PRN

## 2021-11-03 MED ORDER — DEXAMETHASONE SODIUM PHOSPHATE 10 MG/ML IJ SOLN
INTRAMUSCULAR | Status: DC | PRN
  Administered 2021-11-03: 5 mg via INTRAVENOUS

## 2021-11-03 MED ORDER — MIDAZOLAM HCL 5 MG/5ML IJ SOLN
INTRAMUSCULAR | Status: DC | PRN
Start: 2021-11-03 — End: 2021-11-03
  Administered 2021-11-03: 2 mg via INTRAVENOUS

## 2021-11-03 MED ORDER — ORAL CARE MOUTH RINSE: 15.0000 mL | Freq: Once | OROMUCOSAL | Status: AC

## 2021-11-03 MED ORDER — SODIUM CHLORIDE 0.9 % IV SOLN
INTRAVENOUS | Status: AC
  Filled 2021-11-03: qty 1000

## 2021-11-03 MED ORDER — IOHEXOL 350 MG/ML SOLN
80.0000 mL | Freq: Once | INTRAVENOUS | Status: AC | PRN
  Administered 2021-11-03: 80 mL via INTRAVENOUS

## 2021-11-03 MED ORDER — PHENYLEPHRINE 40 MCG/ML (10ML) SYRINGE FOR IV PUSH (FOR BLOOD PRESSURE SUPPORT)
PREFILLED_SYRINGE | INTRAVENOUS | Status: DC | PRN
  Administered 2021-11-03 (×4): 80 ug via INTRAVENOUS

## 2021-11-03 MED ORDER — CHLORHEXIDINE GLUCONATE 0.12 % MT SOLN
15.0000 mL | Freq: Once | OROMUCOSAL | Status: AC
  Administered 2021-11-03: 15 mL via OROMUCOSAL

## 2021-11-03 MED ORDER — MIDAZOLAM HCL 2 MG/2ML IJ SOLN
INTRAMUSCULAR | Status: AC
  Filled 2021-11-03: qty 2

## 2021-11-03 MED ORDER — SUGAMMADEX SODIUM 200 MG/2ML IV SOLN
INTRAVENOUS | Status: DC | PRN
  Administered 2021-11-03 (×2): 200 mg via INTRAVENOUS

## 2021-11-03 MED ORDER — SODIUM CHLORIDE 0.9 % IV SOLN: INTRAVENOUS | Status: DC

## 2021-11-03 MED ORDER — FENTANYL CITRATE (PF) 250 MCG/5ML IJ SOLN
INTRAMUSCULAR | Status: DC | PRN
Start: 2021-11-03 — End: 2021-11-03
  Administered 2021-11-03: 100 ug via INTRAVENOUS
  Administered 2021-11-03: 50 ug via INTRAVENOUS

## 2021-11-03 MED ORDER — FENTANYL CITRATE (PF) 250 MCG/5ML IJ SOLN
INTRAMUSCULAR | Status: AC
  Filled 2021-11-03: qty 5

## 2021-11-03 MED ORDER — SODIUM CHLORIDE 0.9 % IV SOLN
2.0000 g | Freq: Once | INTRAVENOUS | Status: AC
  Administered 2021-11-03: 2 g via INTRAVENOUS
  Filled 2021-11-03: qty 2

## 2021-11-03 MED ORDER — SODIUM CHLORIDE 0.9 % IV SOLN: 2.0000 g | INTRAVENOUS | Status: DC

## 2021-11-03 MED ORDER — LACTATED RINGERS IV SOLN: INTRAVENOUS | Status: DC

## 2021-11-03 MED ORDER — ROCURONIUM BROMIDE 10 MG/ML (PF) SYRINGE
PREFILLED_SYRINGE | INTRAVENOUS | Status: DC | PRN
  Administered 2021-11-03 (×2): 20 mg via INTRAVENOUS
  Administered 2021-11-03: 50 mg via INTRAVENOUS

## 2021-11-03 MED ORDER — SODIUM CHLORIDE 0.9 % IV SOLN
INTRAVENOUS | Status: AC
  Filled 2021-11-03: qty 250

## 2021-11-03 MED ORDER — ONDANSETRON HCL 4 MG/2ML IJ SOLN
INTRAMUSCULAR | Status: DC | PRN
  Administered 2021-11-03: 4 mg via INTRAVENOUS

## 2021-11-03 MED ORDER — ALBUTEROL SULFATE (2.5 MG/3ML) 0.083% IN NEBU
INHALATION_SOLUTION | RESPIRATORY_TRACT | Status: AC
  Filled 2021-11-03: qty 3

## 2021-11-03 MED ORDER — PHENYLEPHRINE HCL-NACL 20-0.9 MG/250ML-% IV SOLN
INTRAVENOUS | Status: DC | PRN
  Administered 2021-11-03: 50 ug/min via INTRAVENOUS

## 2021-11-03 MED ORDER — PROPOFOL 10 MG/ML IV BOLUS
INTRAVENOUS | Status: DC | PRN
  Administered 2021-11-03: 90 mg via INTRAVENOUS
  Administered 2021-11-03: 60 mg via INTRAVENOUS

## 2021-11-03 MED ORDER — LIDOCAINE 2% (20 MG/ML) 5 ML SYRINGE
INTRAMUSCULAR | Status: DC | PRN
  Administered 2021-11-03: 40 mg via INTRAVENOUS

## 2021-11-03 NOTE — Anesthesia Postprocedure Evaluation (Signed)
Anesthesia Post Note ? ?Patient: Raven Lee ? ?Procedure(s) Performed: CT MICROWAVE ABLATION ? ?  ? ?Patient location during evaluation: PACU ?Anesthesia Type: General ?Level of consciousness: awake and alert ?Pain management: pain level controlled ?Vital Signs Assessment: post-procedure vital signs reviewed and stable ?Respiratory status: spontaneous breathing, nonlabored ventilation, respiratory function stable and patient connected to nasal cannula oxygen ?Cardiovascular status: blood pressure returned to baseline and stable ?Postop Assessment: no apparent nausea or vomiting ?Anesthetic complications: no ? ? ?No notable events documented. ? ?Last Vitals:  ?Vitals:  ? 11/03/21 1515 11/03/21 1549  ?BP:    ?Pulse:  81  ?Resp:  18  ?Temp:    ?SpO2: (!) 89% 90%  ?  ?Last Pain:  ?Vitals:  ? 11/03/21 1500  ?TempSrc:   ?PainSc: 0-No pain  ? ? ?  ?  ?  ?  ?  ?  ? ?Raven Lee ? ? ? ? ?

## 2021-11-03 NOTE — Progress Notes (Signed)
?  Patient seen by Dr. Anselm Pancoast and me while in PACU. ? ?Doing well. Minimal pain. Wants to go home. ? ?Will call in [place] and ok to D/C home. ? ?IR clinic will call with f/u details. ? ?Murrell Redden PA-C ?11/03/2021 ?4:55 PM ? ? ? ? ?

## 2021-11-03 NOTE — Transfer of Care (Signed)
Immediate Anesthesia Transfer of Care Note ? ?Patient: Raven Lee ? ?Procedure(s) Performed: CT MICROWAVE ABLATION ? ?Patient Location: PACU ? ?Anesthesia Type:General ? ?Level of Consciousness: sedated ? ?Airway & Oxygen Therapy: Patient Spontanous Breathing and Patient connected to face mask oxygen ? ?Post-op Assessment: Report given to RN and Post -op Vital signs reviewed and stable ? ?Post vital signs: Reviewed and stable ? ?Last Vitals:  ?Vitals Value Taken Time  ?BP 184/96 11/03/21 1406  ?Temp    ?Pulse 88 11/03/21 1408  ?Resp 18 11/03/21 1408  ?SpO2 98 % 11/03/21 1408  ?Vitals shown include unvalidated device data. ? ?Last Pain:  ?Vitals:  ? 11/03/21 1025  ?TempSrc:   ?PainSc: 0-No pain  ?   ? ?  ? ?Complications: No notable events documented. ?

## 2021-11-03 NOTE — Anesthesia Procedure Notes (Signed)
Procedure Name: Intubation ?Date/Time: 11/03/2021 11:29 AM ?Performed by: Talbot Grumbling, CRNA ?Pre-anesthesia Checklist: Patient identified, Emergency Drugs available, Suction available and Patient being monitored ?Patient Re-evaluated:Patient Re-evaluated prior to induction ?Oxygen Delivery Method: Circle system utilized ?Preoxygenation: Pre-oxygenation with 100% oxygen ?Induction Type: IV induction ?Ventilation: Mask ventilation without difficulty ?Laryngoscope Size: Mac and 3 ?Grade View: Grade I ?Tube type: Oral ?Tube size: 7.0 mm ?Number of attempts: 1 ?Airway Equipment and Method: Stylet ?Placement Confirmation: ETT inserted through vocal cords under direct vision, positive ETCO2 and breath sounds checked- equal and bilateral ?Secured at: 21 cm ?Tube secured with: Tape ?Dental Injury: Teeth and Oropharynx as per pre-operative assessment  ? ? ? ? ?

## 2021-11-03 NOTE — Anesthesia Preprocedure Evaluation (Signed)
Anesthesia Evaluation  ?Patient identified by MRN, date of birth, ID band ?Patient awake ? ? ? ?Reviewed: ?Allergy & Precautions, H&P , NPO status , Patient's Chart, lab work & pertinent test results, reviewed documented beta blocker date and time  ? ?Airway ?Mallampati: II ? ?TM Distance: >3 FB ?Neck ROM: Full ? ? ? Dental ?no notable dental hx. ?(+) Edentulous Upper, Dental Advisory Given ?  ?Pulmonary ?shortness of breath, COPD, former smoker,  ?  ?Pulmonary exam normal ?breath sounds clear to auscultation ? ? ? ? ? ? Cardiovascular ?Exercise Tolerance: Good ?hypertension, Pt. on medications and Pt. on home beta blockers ? ?Rhythm:Regular Rate:Normal ? ? ?  ?Neuro/Psych ?Anxiety negative neurological ROS ?   ? GI/Hepatic ?negative GI ROS, (+) Hepatitis -, CLiver lesion ?  ?Endo/Other  ?negative endocrine ROS ? Renal/GU ?negative Renal ROS  ?negative genitourinary ?  ?Musculoskeletal ?negative musculoskeletal ROS ?(+)  ? Abdominal ?Normal abdominal exam  (+)   ?Peds ? Hematology ?negative hematology ROS ?(+)   ?Anesthesia Other Findings ? ? Reproductive/Obstetrics ?negative OB ROS ? ?  ? ? ? ? ? ? ? ? ? ? ? ? ? ?  ?  ? ? ? ? ? ? ? ? ?Anesthesia Physical ? ?Anesthesia Plan ? ?ASA: 2 ? ?Anesthesia Plan: General  ? ?Post-op Pain Management:   ? ?Induction: Intravenous ? ?PONV Risk Score and Plan: 4 or greater and Ondansetron, Dexamethasone and Treatment may vary due to age or medical condition ? ?Airway Management Planned: Oral ETT ? ?Additional Equipment:  ? ?Intra-op Plan:  ? ?Post-operative Plan: Extubation in OR ? ?Informed Consent: I have reviewed the patients History and Physical, chart, labs and discussed the procedure including the risks, benefits and alternatives for the proposed anesthesia with the patient or authorized representative who has indicated his/her understanding and acceptance.  ? ? ? ?Dental advisory given ? ?Plan Discussed with: CRNA ? ?Anesthesia Plan  Comments: (Lab Results ?     Component                Value               Date                 ?     WBC                      3.9 (L)             10/25/2021           ?     HGB                      15.4 (H)            10/25/2021           ?     HCT                      45.6                10/25/2021           ?     MCV                      99.6                10/25/2021           ?  PLT                      104 (L)             10/25/2021           ?Lab Results ?     Component                Value               Date                 ?     NA                       139                 10/25/2021           ?     K                        3.9                 10/25/2021           ?     CO2                      29                  10/25/2021           ?     GLUCOSE                  106 (H)             10/25/2021           ?     BUN                      10                  10/25/2021           ?     CREATININE               0.81                10/25/2021           ?     CALCIUM                  9.0                 10/25/2021           ?     GFRNONAA                 >60                 10/25/2021          )  ? ? ? ? ? ? ?Anesthesia Quick Evaluation ? ?

## 2021-11-03 NOTE — H&P (Signed)
Chief Complaint: Hepatocellular carcinoma  Referring Physician(s): Katragadda,Sreedhar  Supervising Physician: Markus Daft  Patient Status: [facility] - Out-pt  History of Present Illness: Raven Lee is a 71 y.o. female with history of Hepatitis C that was treated in 2019.    She has undergone imaging surveillance for a hepatic lesion since 2019.    AFP has been mildly elevated measuring 13.5 three years ago and measuring 9.8 on 05/28/2021.    She was referred to Interventional Radiology due to new lesions seen on recent MRI of the abdomen.    There are 2 new arterial phase enhancing lesions identified within the liver including a 2 cm lesion in the right hepatic lobe that is compatible with a LI-RADS 5 lesion.  She was seen by Dr. Anselm Pancoast on 09/01/21 to discuss potential minimally invasive treatment.  She underwent image guided biopsy of the lesion which confirmed it was [place].  She is here today for thermal ablation.  She is NPO. No nausea/vomiting. No Fever/chills. ROS negative.  Past Medical History:  Diagnosis Date   Anxiety    Difficulty sleeping    Emphysema/COPD ([place])    Hepatitis    HEPATITIS C   History of panic attacks    Hypertension    Liver lesion    Pneumonia    Psoriasis     Past Surgical History:  Procedure Laterality Date   ABDOMINAL HYSTERECTOMY     COLONOSCOPY  2009   [facility]: diverticulosis   IR RADIOLOGIST EVAL & MGMT  09/01/2021   LAPAROSCOPIC GASTRIC SLEEVE RESECTION N/A 12/01/2014   Procedure: LAPAROSCOPIC GASTRIC SLEEVE RESECTION UPPER ENDO;  Surgeon: Excell Seltzer, MD;  Location: [facility] ORS;  Service: General;  Laterality: N/A;   LUNG LOBECTOMY  03/2011   LLL-severe PNA   RADIOFREQUENCY ABLATION N/A 10/13/2021   Procedure: CT MICROWAVE ABLATION;  Surgeon: Markus Daft, MD;  Location: [facility] ORS;  Service: Anesthesiology;  Laterality: N/A;   WISDOM TOOTH EXTRACTION      Allergies: Oxycodone  Medications: Prior to Admission medications    Medication Sig Start Date End Date Taking? Authorizing Provider  hydrochlorothiazide (HYDRODIURIL) 25 MG tablet Take 25 mg by mouth every evening.  08/07/13   [provider]  metoprolol (LOPRESSOR) 50 MG tablet Take 50 mg by mouth daily. 08/07/13   [provider]  mirtazapine (REMERON) 45 MG tablet Take 45 mg by mouth at bedtime. 09/02/21   [provider]  nicotine polacrilex (COMMIT) 2 MG lozenge Take 2 mg by mouth as needed (stress).    [provider]  nystatin-triamcinolone ointment (MYCOLOG) Apply 1 application topically 2 (two) times daily. Patient not taking: Reported on 09/27/2021 02/23/16   Florian Buff, MD  quinapril (ACCUPRIL) 40 MG tablet Take 40 mg by mouth every evening.  07/17/13   [provider]  venlafaxine XR (EFFEXOR-XR) 150 MG 24 hr capsule Take 150 mg by mouth every evening.  08/07/13   [provider]     Family History  Problem Relation Age of Onset   Lung cancer Mother        smoked   Heart disease Father    Heart attack Brother    Diabetes Brother    Stroke Brother    Obesity Other    Colon cancer Neg Hx    Liver disease Neg Hx     Social History   Socioeconomic History   Marital status: Married    Spouse name: Not on file   Number  of children: Not on file   Years of education: Not on file   Highest education level: Not on file  Occupational History   Occupation: Retired    Fish farm manager: [employer]  Tobacco Use   Smoking status: Former    Packs/day: 1.00    Years: 25.00    Pack years: 25.00    Types: Cigarettes    Quit date: 03/30/2011    Years since quitting: 10.6   Smokeless tobacco: Never  Vaping Use   Vaping Use: Never used  Substance and Sexual Activity   Alcohol use: Yes    Alcohol/week: 0.0 standard drinks    Comment: socially   Drug use: No   Sexual activity: Never    Birth control/protection: Post-menopausal  Other Topics Concern   Not on file  Social History Narrative   Not on  file   Social Determinants of Health   Financial Resource Strain: Not on file  Food Insecurity: Not on file  Transportation Needs: Not on file  Physical Activity: Not on file  Stress: Not on file  Social Connections: Not on file     Review of Systems: A 12 point ROS discussed and pertinent positives are indicated in the HPI above.  All other systems are negative.  Review of Systems  Vital Signs: Temp 98.7 188/74 Pulse 65  Physical Exam Vitals reviewed.  Constitutional:      Appearance: Normal appearance.  HENT:     Head: Normocephalic and atraumatic.  Eyes:     Extraocular Movements: Extraocular movements intact.  Cardiovascular:     Rate and Rhythm: Normal rate and regular rhythm.  Pulmonary:     Effort: Pulmonary effort is normal. No respiratory distress.     Breath sounds: Normal breath sounds.  Abdominal:     Palpations: Abdomen is soft.  Musculoskeletal:        General: Normal range of motion.     Cervical back: Normal range of motion.  Skin:    General: Skin is warm and dry.  Neurological:     General: No focal deficit present.     Mental Status: She is alert and oriented to person, place, and time.  Psychiatric:        Mood and Affect: Mood normal.        Behavior: Behavior normal.        Thought Content: Thought content normal.        Judgment: Judgment normal.    Imaging: DG Chest 1 View  Result Date: 10/13/2021 CLINICAL DATA:  Pre ablation. EXAM: CHEST  1 VIEW COMPARISON:  10/20/2014. FINDINGS: Trachea is midline. Heart size stable. Thoracic aorta is calcified. Biapical pleuroparenchymal scarring. Postoperative volume loss in the left hemithorax. Mild interstitial prominence and even slight nodularity. Chronic blunting of the left costophrenic angle. No definite pleural fluid. IMPRESSION: 1. Mild interstitial prominence and even slight nodularity. Consider CT chest without contrast in further evaluation, as clinically indicated. 2. Postoperative volume  loss in the left hemithorax. Electronically Signed   By: Lorin Picket M.D.   On: 10/13/2021 08:30   MR ABDOMEN W WO CONTRAST  Result Date: 10/22/2021 CLINICAL DATA:  Recent diagnosis of hepatocellular carcinoma. Diagnosed at the site of a capsular based lesion in the right hepatic lobe. EXAM: MRI ABDOMEN WITHOUT AND WITH CONTRAST TECHNIQUE: Multiplanar multisequence MR imaging of the abdomen was performed both before and after the administration of intravenous contrast. CONTRAST:  39m GADAVIST GADOBUTROL 1 MMOL/ML IV SOLN COMPARISON:  MRI of 08/13/2021.  The CT biopsy images of 10/13/2021 are also reviewed. FINDINGS: Lower chest: Left hemidiaphragm elevation. Mild cardiomegaly, without pericardial or pleural effusion. Paraesophageal varices. Hepatobiliary: Moderate cirrhosis. Again identified is a T2 hypointense 5.9 x 3.9 cm segment 7 and 8 hepatic mass which is unchanged over multiple prior exams, likely a regenerative nodule Three hepatic observations are again identified.: -the site of known hepatocellular carcinoma, within the subcapsular portion of segments 5 and 6 measures 2.0 x 1.8 cm and 54/15. Compare 2.1 x 1.5 cm on the prior exam. This demonstrates arterial phase hyperenhancement and subtle central washout including on 49/22. LR 5. -a 7 mm focus of arterial phase hyperenhancement within the high right hepatic lobe (segment 7) on image 29/15 is similar to the prior exam (when remeasured). No washout on delayed images. LR 3 Within the central right hepatic lobe, an arterial phase focus of hyperenhancement measures 12 mm and 49/15 and is similar to on the prior exam (when remeasured). This also demonstrates no washout on delayed images. LR 3 No new liver lesion identified. Normal gallbladder, without biliary ductal dilatation. Pancreas:  Normal, without mass or ductal dilatation. Spleen:  Borderline splenomegaly at 12.7 cm craniocaudal. Adrenals/Urinary Tract: Normal adrenal glands. Normal kidneys,  without hydronephrosis. Stomach/Bowel: Proximal gastric underdistention. Colonic stool burden suggests constipation. Normal small bowel caliber. Vascular/Lymphatic: Aortic atherosclerosis. No splenic artery aneurysm. Portal venous hypertension, including paraesophageal varices. No abdominal adenopathy. Other:  No ascites. Musculoskeletal: No acute osseous abnormality. IMPRESSION: 1. Right hepatic lobe known hepatocellular carcinoma, without evidence of metastatic disease. 2. Similar size of 2 right hepatic lobe foci of arterial phase hyperenhancement which are considered LR 3. No new lesions. 3. Cirrhosis and portal venous hypertension. 4.  Possible constipation. 5.  Aortic Atherosclerosis (ICD10-I70.0). Electronically Signed   By: Abigail Miyamoto M.D.   On: 10/22/2021 08:39   CT Abd Limited W/Cm  Result Date: 10/13/2021 INDICATION: 71 year old with cirrhosis and suspicious hepatic lesions. Planned for microwave ablation of a capsular lesion in the right hepatic lobe. EXAM: 1. Ultrasound-guided liver lesion biopsy 2. CT abdomen limited with contrast MEDICATIONS: Cefoxitin 2 g ANESTHESIA/SEDATION: General anesthesia FLUOROSCOPY TIME:  None COMPLICATIONS: None immediate. PROCEDURE: Informed written consent was obtained from the patient after a thorough discussion of the procedural risks, benefits and alternatives. All questions were addressed. Patient was intubated by anesthesia in the CT suite. A timeout was performed prior to the initiation of the procedure. Ultrasound demonstrated a hypoechoic lesion in the right hepatic lobe very close to overlying ribs. CT images through the abdomen were obtained. The capsular lesion in the right hepatic lobe was not visible on the noncontrast images. Lesion was visible on the arterial phase of imaging in the periphery of the right hepatic lobe. Right side abdomen was prepped and draped in sterile fashion. Maximal barrier sterile technique was utilized including caps, mask,  sterile gowns, sterile gloves, sterile drape, hand hygiene and skin antiseptic. Ultrasound was able to identify the lesion. Using ultrasound guidance, a spinal needle was directed adjacent to the lesion and position was evaluated with CT. At this point, I felt that it would be very difficult to successfully treat this lesion without ablating the overlying rib and adjacent abdominal soft tissues. Therefore, ablation was not attempted. The spinal needle was removed. Although the suspicious lesion was considered a LI-RADS 5 lesion on MRI, I did not see significant contrast washout on this examination and felt that it may be helpful to get a tissue sample. Small incision was made. Using  ultrasound guidance, 17 gauge coaxial needle was directed into the lesion while traversing normal parenchyma. Three core biopsies were obtained with an 18 gauge device. Specimens placed in formalin. 17 gauge needle was removed. Bandage placed over the puncture site. FINDINGS: Right hepatic lesion along the lateral capsule was not visible on the noncontrast CT images. Lesion is visible during the arterial phase of contrast but than not clearly visible on the portal venous phase of contrast. In addition, the lesion was underneath a right rib. Based on the location of the lesion, decided not to proceed with microwave ablation. Ultrasound-guided biopsy was performed and the biopsy needle was confirmed within the lesion. No immediate bleeding or hematoma formation. Patient has another known large lesion in segment 7 which is barely perceptible on the arterial phase imaging. Another known lesion in the central aspect of the liver enhances on the arterial phase imaging. 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 IMPRESSION: 1. Image guided microwave ablation was not performed of the capsular right hepatic lesion due to the location of the lesion and poor percutaneous windows. 2.  Ultrasound-guided biopsy of the right hepatic lesion. Electronically Signed   By: Markus Daft M.D.   On: 10/13/2021 13:09   CT US GUIDED BIOPSY  Result Date: 10/13/2021 INDICATION: 71 year old with cirrhosis and suspicious hepatic lesions. Planned for microwave ablation of a capsular lesion in the right hepatic lobe. EXAM: 1. Ultrasound-guided liver lesion biopsy 2. CT abdomen limited with contrast MEDICATIONS: Cefoxitin 2 g ANESTHESIA/SEDATION: General anesthesia FLUOROSCOPY TIME:  None COMPLICATIONS: None immediate. PROCEDURE: Informed written consent was obtained from the patient after a thorough discussion of the procedural risks, benefits and alternatives. All questions were addressed. Patient was intubated by anesthesia in the CT suite. A timeout was performed prior to the initiation of the procedure. Ultrasound demonstrated a hypoechoic lesion in the right hepatic lobe very close to overlying ribs. CT images through the abdomen were obtained. The capsular lesion in the right hepatic lobe was not visible on the noncontrast images. Lesion was visible on the arterial phase of imaging in the periphery of the right hepatic lobe. Right side abdomen was prepped and draped in sterile fashion. Maximal barrier sterile technique was utilized including caps, mask, sterile gowns, sterile gloves, sterile drape, hand hygiene and skin antiseptic. Ultrasound was able to identify the lesion. Using ultrasound guidance, a spinal needle was directed adjacent to the lesion and position was evaluated with CT. At this point, I felt that it would be very difficult to successfully treat this lesion without ablating the overlying rib and adjacent abdominal soft tissues. Therefore, ablation was not attempted. The spinal needle was removed. Although the suspicious lesion was considered a LI-RADS 5 lesion on MRI, I did not see significant contrast washout on this examination and felt that it may be helpful to get a tissue sample. Small  incision was made. Using ultrasound guidance, 17 gauge coaxial needle was directed into the lesion while traversing normal parenchyma. Three core biopsies were obtained with an 18 gauge device. Specimens placed in formalin. 17 gauge needle was removed. Bandage placed over the puncture site. FINDINGS: Right hepatic lesion along the lateral capsule was not visible on the noncontrast CT images. Lesion is visible during the arterial phase of contrast but than not clearly visible on the portal venous phase of contrast. In addition, the lesion was underneath a right rib. Based on the location of the lesion, decided not to proceed with  microwave ablation. Ultrasound-guided biopsy was performed and the biopsy needle was confirmed within the lesion. No immediate bleeding or hematoma formation. Patient has another known large lesion in segment 7 which is barely perceptible on the arterial phase imaging. Another known lesion in the central aspect of the liver enhances on the arterial phase imaging. 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 IMPRESSION: 1. Image guided microwave ablation was not performed of the capsular right hepatic lesion due to the location of the lesion and poor percutaneous windows. 2. Ultrasound-guided biopsy of the right hepatic lesion. Electronically Signed   By: Markus Daft M.D.   On: 10/13/2021 13:09    Labs:  CBC: Recent Labs    05/28/21 1204 09/17/21 1313 10/13/21 0702 10/25/21 1338  WBC 2.8* 2.5* 2.6* 3.9*  HGB 14.6 13.9 15.1* 15.4*  HCT 43.2 41.8 45.5 45.6  PLT 87* 95* 104* 104*    COAGS: Recent Labs    09/17/21 1313 10/25/21 1338  INR 1.2 1.1    BMP: Recent Labs    09/17/21 1313 10/13/21 0702 10/25/21 1338  NA 139 138 139  K 3.5 3.6 3.9  CL 103 107 105  CO2 '29 26 29  '$ GLUCOSE 159* 91 106*  BUN '10 8 10  '$ CALCIUM 8.7* 8.7* 9.0  CREATININE 0.72 0.78 0.81  GFRNONAA >60 >60 >60    LIVER FUNCTION  TESTS: Recent Labs    09/17/21 1313 10/13/21 0702 10/25/21 1338  BILITOT 1.6* 1.6* 1.7*  AST 53* 56* 49*  ALT '23 20 22  '$ ALKPHOS 104 128* 119  PROT 8.0 8.3* 8.4*  ALBUMIN 3.5 3.5 3.7    TUMOR MARKERS: No results for input(s): AFPTM, CEA, CA199, CHROMGRNA in the last 8760 hours.  Assessment and Plan:  Hepatocellular carcinoma.  Will proceed with image guided thermal ablation today by Dr. Anselm Pancoast.  Risks and benefits discussed with the patient including, but not limited to bleeding, infection, liver failure, bile duct injury, pneumothorax or damage to adjacent structures.  All of the patient's questions were answered, patient is agreeable to proceed. Consent signed and in chart.  Thank you for allowing our service to participate in Raven Lee 's care.  Electronically Signed: Murrell Redden, PA-C   11/03/2021, 10:52 AM      I spent a total of   25 Minutes in face to face in clinical consultation, greater than 50% of which was counseling/coordinating care for thermal ablation liver.

## 2021-11-03 NOTE — Procedures (Signed)
Interventional Radiology Procedure: ? ? ?Indications: Hepatocellular carcinoma ? ?Procedure: Image guided microwave ablation of liver lesion ? ?Findings: Slightly exophytic right hepatic lesion.  Treated with XT Neuwave probe.  Hydrodissection performed around the lesion and liver. ? ?Complications: No immediate complications noted. ?    ?EBL: Minimal ? ?Plan: Bedrest 3 hours.  Will assess patient for overnight observation vs discharge in few hours.  ? ? ?Lashan Macias R. Anselm Pancoast, MD  ?Pager: [phone] ? ? ? ?  ?

## 2021-11-03 NOTE — H&P (Deleted)
  The note originally documented on this encounter has been moved the the encounter in which it belongs.  

## 2021-11-04 ENCOUNTER — Other Ambulatory Visit (HOSPITAL_COMMUNITY): Payer: Medicare HMO

## 2021-11-04 ENCOUNTER — Encounter (HOSPITAL_COMMUNITY): Payer: Self-pay | Admitting: Diagnostic Radiology

## 2021-11-09 ENCOUNTER — Other Ambulatory Visit (HOSPITAL_COMMUNITY): Payer: Medicare HMO

## 2021-11-12 ENCOUNTER — Telehealth: Payer: Medicare HMO

## 2021-11-16 ENCOUNTER — Inpatient Hospital Stay (HOSPITAL_COMMUNITY): Payer: Medicare HMO | Attending: Hematology

## 2021-11-16 DIAGNOSIS — D72819 Decreased white blood cell count, unspecified: Secondary | ICD-10-CM | POA: Diagnosis not present

## 2021-11-16 DIAGNOSIS — R772 Abnormality of alphafetoprotein: Secondary | ICD-10-CM | POA: Insufficient documentation

## 2021-11-16 DIAGNOSIS — D696 Thrombocytopenia, unspecified: Secondary | ICD-10-CM | POA: Diagnosis not present

## 2021-11-16 DIAGNOSIS — C220 Liver cell carcinoma: Secondary | ICD-10-CM | POA: Insufficient documentation

## 2021-11-16 DIAGNOSIS — Z79899 Other long term (current) drug therapy: Secondary | ICD-10-CM | POA: Insufficient documentation

## 2021-11-16 DIAGNOSIS — K7689 Other specified diseases of liver: Secondary | ICD-10-CM | POA: Diagnosis not present

## 2021-11-16 DIAGNOSIS — K769 Liver disease, unspecified: Secondary | ICD-10-CM

## 2021-11-16 LAB — CBC WITH DIFFERENTIAL/PLATELET
Abs Immature Granulocytes: 0.01 10*3/uL (ref 0.00–0.07)
Basophils Absolute: 0 10*3/uL (ref 0.0–0.1)
Basophils Relative: 1 %
Eosinophils Absolute: 0 10*3/uL (ref 0.0–0.5)
Eosinophils Relative: 0 %
HCT: 43.1 % (ref 36.0–46.0)
Hemoglobin: 14.7 g/dL (ref 12.0–15.0)
Immature Granulocytes: 0 %
Lymphocytes Relative: 30 %
Lymphs Abs: 0.8 10*3/uL (ref 0.7–4.0)
MCH: 34.3 pg — ABNORMAL HIGH (ref 26.0–34.0)
MCHC: 34.1 g/dL (ref 30.0–36.0)
MCV: 100.7 fL — ABNORMAL HIGH (ref 80.0–100.0)
Monocytes Absolute: 0.4 10*3/uL (ref 0.1–1.0)
Monocytes Relative: 14 %
Neutro Abs: 1.6 10*3/uL — ABNORMAL LOW (ref 1.7–7.7)
Neutrophils Relative %: 55 %
Platelets: 85 10*3/uL — ABNORMAL LOW (ref 150–400)
RBC: 4.28 MIL/uL (ref 3.87–5.11)
RDW: 13.9 % (ref 11.5–15.5)
WBC Morphology: REACTIVE
WBC: 2.8 10*3/uL — ABNORMAL LOW (ref 4.0–10.5)
nRBC: 0 % (ref 0.0–0.2)

## 2021-11-17 LAB — AFP TUMOR MARKER: AFP, Serum, Tumor Marker: 9.3 ng/mL — ABNORMAL HIGH (ref 0.0–9.2)

## 2021-11-23 ENCOUNTER — Inpatient Hospital Stay (HOSPITAL_COMMUNITY): Payer: Medicare HMO | Admitting: Hematology

## 2021-11-30 ENCOUNTER — Ambulatory Visit
Admission: RE | Admit: 2021-11-30 | Discharge: 2021-11-30 | Disposition: A | Discharge status: Home / Self Care | Payer: Medicare HMO | Source: Ambulatory Visit | Attending: Physician Assistant | Admitting: Physician Assistant

## 2021-11-30 DIAGNOSIS — C220 Liver cell carcinoma: Secondary | ICD-10-CM

## 2021-12-13 ENCOUNTER — Inpatient Hospital Stay (HOSPITAL_COMMUNITY): Payer: Medicare HMO | Attending: Hematology | Admitting: Hematology

## 2021-12-13 VITALS — BP 172/67 | HR 58 | Temp 97.1°F | Resp 20 | Ht 61.61 in | Wt 156.2 lb

## 2021-12-13 DIAGNOSIS — D696 Thrombocytopenia, unspecified: Secondary | ICD-10-CM | POA: Insufficient documentation

## 2021-12-13 DIAGNOSIS — C220 Liver cell carcinoma: Secondary | ICD-10-CM | POA: Diagnosis not present

## 2021-12-13 DIAGNOSIS — K769 Liver disease, unspecified: Secondary | ICD-10-CM

## 2021-12-13 DIAGNOSIS — D72819 Decreased white blood cell count, unspecified: Secondary | ICD-10-CM | POA: Diagnosis not present

## 2021-12-13 NOTE — Progress Notes (Signed)
? ?Raven Lee ?[address] ?[place], [place] [address] ? ? ?CLINIC:  ?Medical Oncology/Hematology ? ?PCP:  ?Celene Squibb, MD ?[address] [place] [address]  ?[phone] ? ?REASON FOR VISIT:  ?Follow-up for leukopenia and thrombocytopenia  ? ?PRIOR THERAPY: none ? ?CURRENT THERAPY: surveillance ? ?INTERVAL HISTORY:  ?Raven Lee, a 71 y.o. female, returns for routine follow-up for her leukopenia and thrombocytopenia. Raven Lee was last seen on 08/25/2021. ? ?Today she reports feeling good. She denies any infections in the last 4 months. She denies fevers, night sweats, and weight loss. She denies skin rash. Her appetite is good.  ? ?REVIEW OF SYSTEMS:  ?Review of Systems  ?Constitutional:  Negative for appetite change, fatigue, fever and unexpected weight change.  ?Respiratory:  Positive for shortness of breath.   ?Endocrine: Negative for hot flashes.  ?All other systems reviewed and are negative. ? ?PAST MEDICAL/SURGICAL HISTORY:  ?Past Medical History:  ?Diagnosis Date  ? Anxiety   ? Difficulty sleeping   ? Emphysema/COPD [facility])   ? Hepatitis   ? HEPATITIS C  ? History of panic attacks   ? Hypertension   ? Liver lesion   ? Pneumonia   ? Psoriasis   ? ?Past Surgical History:  ?Procedure Laterality Date  ? ABDOMINAL HYSTERECTOMY    ? COLONOSCOPY  2009  ? [facility]: diverticulosis  ? IR RADIOLOGIST EVAL & MGMT  09/01/2021  ? LAPAROSCOPIC GASTRIC SLEEVE RESECTION N/A 12/01/2014  ? Procedure: LAPAROSCOPIC GASTRIC SLEEVE RESECTION UPPER ENDO;  Surgeon: Excell Seltzer, MD;  Location: [facility];  Service: General;  Laterality: N/A;  ? LUNG LOBECTOMY  03/2011  ? LLL-severe PNA  ? RADIOFREQUENCY ABLATION N/A 10/13/2021  ? Procedure: CT MICROWAVE ABLATION;  Surgeon: Markus Daft, MD;  Location: [facility];  Service: Anesthesiology;  Laterality: N/A;  ? RADIOFREQUENCY ABLATION N/A 11/03/2021  ? Procedure: CT MICROWAVE ABLATION;  Surgeon: Markus Daft, MD;  [facility]: [facility];  Service: Anesthesiology;  Laterality:  N/A;  ? WISDOM TOOTH EXTRACTION    ? ? ?SOCIAL HISTORY:  ?Social History  ? ?Socioeconomic History  ? Marital status: Married  ?  Spouse name: Not on file  ? Number of children: Not on file  ? Years of education: Not on file  ? Highest education level: Not on file  ?Occupational History  ? Occupation: Retired  ?  Employer: [employer]  ?Tobacco Use  ? Smoking status: Former  ?  Packs/day: 1.00  ?  Years: 25.00  ?  Pack years: 25.00  ?  Types: Cigarettes  ?  Quit date: 03/30/2011  ?  Years since quitting: 10.7  ? Smokeless tobacco: Never  ?Vaping Use  ? Vaping Use: Never used  ?Substance and Sexual Activity  ? Alcohol use: Yes  ?  Alcohol/week: 0.0 standard drinks  ?  Comment: socially  ? Drug use: No  ? Sexual activity: Never  ?  Birth control/protection: Post-menopausal  ?Other Topics Concern  ? Not on file  ?Social History Narrative  ? Not on file  ? ?Social Determinants of Health  ? ?Financial Resource Strain: Not on file  ?Food Insecurity: Not on file  ?Transportation Needs: Not on file  ?Physical Activity: Not on file  ?Stress: Not on file  ?Social Connections: Not on file  ?Intimate Partner Violence: Not on file  ? ? ?FAMILY HISTORY:  ?Family History  ?Problem Relation Age of Onset  ? Lung cancer Mother   ?  smoked  ? Heart disease Father   ? Heart attack Brother   ? Diabetes Brother   ? Stroke Brother   ? Obesity Other   ? Colon cancer Neg Hx   ? Liver disease Neg Hx   ? ? ?CURRENT MEDICATIONS:  ?Current Outpatient Medications  ?Medication Sig Dispense Refill  ? hydrochlorothiazide (HYDRODIURIL) 25 MG tablet Take 25 mg by mouth every evening.     ? HYDROcodone-acetaminophen (NORCO) 5-325 MG tablet Take 1 tablet by mouth every 4 (four) hours as needed for up to 10 doses for moderate pain or severe pain. 10 tablet 0  ? metoprolol (LOPRESSOR) 50 MG tablet Take 50 mg by mouth daily.    ? mirtazapine (REMERON) 45 MG tablet Take 45 mg by mouth at bedtime.    ? nicotine polacrilex (COMMIT) 2 MG lozenge Take 2 mg by  mouth as needed (stress).    ? nystatin-triamcinolone ointment (MYCOLOG) Apply 1 application topically 2 (two) times daily. 30 g 11  ? quinapril (ACCUPRIL) 40 MG tablet Take 40 mg by mouth every evening.     ? venlafaxine XR (EFFEXOR-XR) 150 MG 24 hr capsule Take 150 mg by mouth every evening.     ? ?No current facility-administered medications for this visit.  ? ? ?ALLERGIES:  ?Allergies  ?Allergen Reactions  ? Oxycodone Other (See Comments)  ?  Hallucinations  ? ? ?PHYSICAL EXAM:  ?Performance status (ECOG): 0 - Asymptomatic ? ?Vitals:  ? 12/13/21 1354  ?BP: (!) 172/67  ?Pulse: (!) 58  ?Resp: 20  ?Temp: (!) 97.1 ?F (36.2 ?C)  ?SpO2: 97%  ? ?Wt Readings from Last 3 Encounters:  ?12/13/21 156 lb 3.2 oz (70.9 kg)  ?11/03/21 152 lb 1.9 oz (69 kg)  ?10/25/21 152 lb 3.2 oz (69 kg)  ? ?Physical Exam ?Vitals reviewed.  ?Constitutional:   ?   Appearance: Normal appearance.  ?Cardiovascular:  ?   Rate and Rhythm: Normal rate and regular rhythm.  ?   Pulses: Normal pulses.  ?   Heart sounds: Normal heart sounds.  ?Pulmonary:  ?   Effort: Pulmonary effort is normal.  ?   Breath sounds: Normal breath sounds.  ?Abdominal:  ?   Palpations: Abdomen is soft. There is no hepatomegaly, splenomegaly or mass.  ?   Tenderness: There is no abdominal tenderness.  ?Neurological:  ?   General: No focal deficit present.  ?   Mental Status: She is alert and oriented to person, place, and time.  ?Psychiatric:     ?   Mood and Affect: Mood normal.     ?   Behavior: Behavior normal.  ? ? ?LABORATORY DATA:  ?I have reviewed the labs as listed.  ? ?  Latest Ref Rng & Units 11/16/2021  ?  1:07 PM 10/25/2021  ?  1:38 PM 10/13/2021  ?  7:02 AM  ?CBC  ?WBC 4.0 - 10.5 K/uL 2.8   3.9   2.6    ?Hemoglobin 12.0 - 15.0 g/dL 14.7   15.4   15.1    ?Hematocrit 36.0 - 46.0 % 43.1   45.6   45.5    ?Platelets 150 - 400 K/uL 85   104   104    ? ? ?  Latest Ref Rng & Units 10/25/2021  ?  1:38 PM 10/13/2021  ?  7:02 AM 09/17/2021  ?  1:13 PM  ?CMP  ?Glucose 70 - 99  mg/dL 106   91   159    ?  BUN 8 - 23 mg/dL '10   8   10    '$ ?Creatinine 0.44 - 1.00 mg/dL 0.81   0.78   0.72    ?Sodium 135 - 145 mmol/L 139   138   139    ?Potassium 3.5 - 5.1 mmol/L 3.9   3.6   3.5    ?Chloride 98 - 111 mmol/L 105   107   103    ?CO2 22 - 32 mmol/L '29   26   29    '$ ?Calcium 8.9 - 10.3 mg/dL 9.0   8.7   8.7    ?Total Protein 6.5 - 8.1 g/dL 8.4   8.3   8.0    ?Total Bilirubin 0.3 - 1.2 mg/dL 1.7   1.6   1.6    ?Alkaline Phos 38 - 126 U/L 119   128   104    ?AST 15 - 41 U/L 49   56   53    ?ALT 0 - 44 U/L '22   20   23    '$ ? ?   ?Component Value Date/Time  ? RBC 4.28 11/16/2021 1307  ? MCV 100.7 (H) 11/16/2021 1307  ? MCV 97 11/13/2017 1228  ? MCH 34.3 (H) 11/16/2021 1307  ? MCHC 34.1 11/16/2021 1307  ? RDW 13.9 11/16/2021 1307  ? RDW 14.6 11/13/2017 1228  ? LYMPHSABS 0.8 11/16/2021 1307  ? LYMPHSABS 1.2 11/13/2017 1228  ? MONOABS 0.4 11/16/2021 1307  ? EOSABS 0.0 11/16/2021 1307  ? EOSABS 0.0 11/13/2017 1228  ? BASOSABS 0.0 11/16/2021 1307  ? BASOSABS 0.0 11/13/2017 1228  ? ? ?DIAGNOSTIC IMAGING:  ?I have independently reviewed the scans and discussed with the patient. ?No results found.  ? ?ASSESSMENT:  ?1.  Leukopenia and thrombocytopenia: ?- Patient seen at the request of Dr. Juel Burrow office. ?- CBC on 02/15/2021 with white count 2.4, ANC 1.3, platelet count 84.  Hemoglobin was normal at 15.1.  There was question of aggregation of platelets. ?- She has mildly decreased platelet count since 2016. ?- White count is decreased since 2018. ?- No B symptoms or recurrent infections. ?- She was treated for hep C with Epclusa and was reportedly cured. ?- MRI of the abdomen on 09/12/2019 with hepatic steatosis, cirrhosis with stigmata of portal venous hypertension including borderline splenomegaly and varices. ? ?2.  Social/family history: ?- She lives at home with her husband. ?- She smoked 1 and half pack per day for 40 years and quit in 2012. ?- She is currently working at [employer] daily.  She worked for a  [employer] prior to that.  No history of chemical exposure. ?- No family history of low platelets.  Mother died of lung cancer.  Maternal aunt died of cancer. ?  ? ? ?PLAN:  ?1.  Leukopenia and thrombocy

## 2021-12-13 NOTE — Patient Instructions (Addendum)
[facility] at [facility] ?Discharge Instructions ? ?You were seen and examined today by Dr. Delton Coombes. He reviewed your most recent labs and everything looks stable. Please keep follow up appointments as scheduled in 3 months. ? ? ?Thank you for choosing [place] at [facility] to provide your oncology and hematology care.  To afford each patient quality time with our provider, please arrive at least 15 minutes before your scheduled appointment time.  ? ?If you have a lab appointment with the [facility] please come in thru the Main Entrance and check in at the main information desk. ? ?You need to re-schedule your appointment should you arrive 10 or more minutes late.  We strive to give you quality time with our providers, and arriving late affects you and other patients whose appointments are after yours.  Also, if you no show three or more times for appointments you may be dismissed from the clinic at the providers discretion.     ?Again, thank you for choosing [facility].  Our hope is that these requests will decrease the amount of time that you wait before being seen by our physicians.       ?_____________________________________________________________ ? ?Should you have questions after your visit to [facility], please contact our office at [phone] and follow the prompts.  Our office hours are 8:00 a.m. and 4:30 p.m. Monday - Friday.  Please note that voicemails left after 4:00 p.m. may not be returned until the following business day.  We are closed weekends and major holidays.  You do have access to a nurse 24-7, just call the main number to the clinic [phone] and do not press any options, hold on the line and a nurse will answer the phone.   ? ?For prescription refill requests, have your pharmacy contact our office and allow 72 hours.   ? ?Due to Covid, you will need to wear a mask upon entering the hospital. If you  do not have a mask, a mask will be given to you at the Main Entrance upon arrival. For doctor visits, patients may have 1 support person age 57 or older with them. For treatment visits, patients can not have anyone with them due to social distancing guidelines and our immunocompromised population.  ? ?  ?

## 2021-12-20 DIAGNOSIS — D696 Thrombocytopenia, unspecified: Secondary | ICD-10-CM | POA: Diagnosis not present

## 2021-12-20 DIAGNOSIS — I10 Essential (primary) hypertension: Secondary | ICD-10-CM | POA: Diagnosis not present

## 2021-12-20 DIAGNOSIS — Z9884 Bariatric surgery status: Secondary | ICD-10-CM | POA: Diagnosis not present

## 2021-12-20 DIAGNOSIS — L900 Lichen sclerosus et atrophicus: Secondary | ICD-10-CM | POA: Diagnosis not present

## 2021-12-20 DIAGNOSIS — F329 Major depressive disorder, single episode, unspecified: Secondary | ICD-10-CM | POA: Diagnosis not present

## 2021-12-20 DIAGNOSIS — F5104 Psychophysiologic insomnia: Secondary | ICD-10-CM | POA: Diagnosis not present

## 2021-12-20 DIAGNOSIS — R5382 Chronic fatigue, unspecified: Secondary | ICD-10-CM | POA: Diagnosis not present

## 2021-12-20 DIAGNOSIS — Z8505 Personal history of malignant neoplasm of liver: Secondary | ICD-10-CM | POA: Diagnosis not present

## 2021-12-20 DIAGNOSIS — R69 Illness, unspecified: Secondary | ICD-10-CM | POA: Diagnosis not present

## 2021-12-20 DIAGNOSIS — B182 Chronic viral hepatitis C: Secondary | ICD-10-CM | POA: Diagnosis not present

## 2021-12-20 DIAGNOSIS — L989 Disorder of the skin and subcutaneous tissue, unspecified: Secondary | ICD-10-CM | POA: Diagnosis not present

## 2021-12-20 DIAGNOSIS — R7301 Impaired fasting glucose: Secondary | ICD-10-CM | POA: Diagnosis not present

## 2021-12-30 ENCOUNTER — Other Ambulatory Visit: Payer: Self-pay | Admitting: Diagnostic Radiology

## 2021-12-30 DIAGNOSIS — C220 Liver cell carcinoma: Secondary | ICD-10-CM

## 2022-01-05 DIAGNOSIS — R350 Frequency of micturition: Secondary | ICD-10-CM | POA: Diagnosis not present

## 2022-01-05 DIAGNOSIS — N390 Urinary tract infection, site not specified: Secondary | ICD-10-CM | POA: Diagnosis not present

## 2022-01-27 DIAGNOSIS — R69 Illness, unspecified: Secondary | ICD-10-CM | POA: Diagnosis not present

## 2022-01-27 DIAGNOSIS — F329 Major depressive disorder, single episode, unspecified: Secondary | ICD-10-CM | POA: Diagnosis not present

## 2022-01-28 ENCOUNTER — Ambulatory Visit: Payer: Medicare HMO | Admitting: Gastroenterology

## 2022-02-01 ENCOUNTER — Ambulatory Visit (HOSPITAL_COMMUNITY)
Admission: RE | Admit: 2022-02-01 | Discharge: 2022-02-01 | Disposition: A | Discharge status: Home / Self Care | Payer: Medicare HMO | Source: Ambulatory Visit | Attending: Diagnostic Radiology | Admitting: Diagnostic Radiology

## 2022-02-01 DIAGNOSIS — K7689 Other specified diseases of liver: Secondary | ICD-10-CM | POA: Diagnosis not present

## 2022-02-01 DIAGNOSIS — K766 Portal hypertension: Secondary | ICD-10-CM | POA: Diagnosis not present

## 2022-02-01 DIAGNOSIS — K7460 Unspecified cirrhosis of liver: Secondary | ICD-10-CM | POA: Diagnosis not present

## 2022-02-01 DIAGNOSIS — C220 Liver cell carcinoma: Secondary | ICD-10-CM | POA: Insufficient documentation

## 2022-02-01 MED ORDER — GADOBUTROL 1 MMOL/ML IV SOLN
7.0000 mL | Freq: Once | INTRAVENOUS | Status: AC | PRN
  Administered 2022-02-01: 7 mL via INTRAVENOUS

## 2022-02-08 ENCOUNTER — Ambulatory Visit
Admission: RE | Admit: 2022-02-08 | Discharge: 2022-02-08 | Disposition: A | Discharge status: Home / Self Care | Payer: Medicare HMO | Source: Ambulatory Visit | Attending: Diagnostic Radiology | Admitting: Diagnostic Radiology

## 2022-02-08 ENCOUNTER — Encounter: Payer: Self-pay | Admitting: *Deleted

## 2022-02-08 DIAGNOSIS — Z9889 Other specified postprocedural states: Secondary | ICD-10-CM | POA: Diagnosis not present

## 2022-02-08 DIAGNOSIS — C220 Liver cell carcinoma: Secondary | ICD-10-CM | POA: Diagnosis not present

## 2022-02-08 DIAGNOSIS — K7460 Unspecified cirrhosis of liver: Secondary | ICD-10-CM | POA: Diagnosis not present

## 2022-02-08 HISTORY — PX: IR RADIOLOGIST EVAL & MGMT: IMG5224

## 2022-02-08 NOTE — Progress Notes (Signed)
Chief Complaint: Patient was consulted remotely today (TeleHealth) for follow-up of hepatocellular carcinoma  Referring Physician(s): Derek Jack  History of Present Illness: Raven Lee is a 71 y.o. female with history of hepatitis C, cirrhosis and biopsy-proven hepatocellular carcinoma.  Prior imaging demonstrated multiple enhancing lesions in the liver including a 2 cm capsular lesion in the right hepatic lobe that was biopsy-proven hepatocellular carcinoma.  Patient underwent image guided microwave ablation of the capsular lesion on 11/03/2021.  Patient tolerated the procedure well and recently had follow-up imaging.  Overall, she is feeling very well.  She recently started an antidepressant medicine prescribed by her primary care provider and says that it is working very well.  Her appetite is good.  She has no complaints of pain or abdominal symptoms.  No significant change in her urinary or bowel habits.  Past Medical History:  Diagnosis Date   Anxiety    Difficulty sleeping    Emphysema/COPD ([facility])    Hepatitis    HEPATITIS C   History of panic attacks    Hypertension    Liver lesion    Pneumonia    Psoriasis     Past Surgical History:  Procedure Laterality Date   ABDOMINAL HYSTERECTOMY     COLONOSCOPY  2009   [facility]: diverticulosis   IR RADIOLOGIST EVAL & MGMT  09/01/2021   LAPAROSCOPIC GASTRIC SLEEVE RESECTION N/A 12/01/2014   Procedure: LAPAROSCOPIC GASTRIC SLEEVE RESECTION UPPER ENDO;  Surgeon: Excell Seltzer, MD;  Location: [facility];  Service: General;  Laterality: N/A;   LUNG LOBECTOMY  03/2011   LLL-severe PNA   RADIOFREQUENCY ABLATION N/A 10/13/2021   Procedure: CT MICROWAVE ABLATION;  Surgeon: Markus Daft, MD;  Location: [facility];  Service: Anesthesiology;  Laterality: N/A;   RADIOFREQUENCY ABLATION N/A 11/03/2021   Procedure: CT MICROWAVE ABLATION;  Surgeon: Markus Daft, MD;  Location: [facility];  Service: Anesthesiology;  Laterality: N/A;   WISDOM  TOOTH EXTRACTION      Allergies: Oxycodone  Medications: Prior to Admission medications   Medication Sig Start Date End Date Taking? Authorizing Provider  hydrochlorothiazide (HYDRODIURIL) 25 MG tablet Take 25 mg by mouth every evening.  08/07/13   [provider]  HYDROcodone-acetaminophen (NORCO) 5-325 MG tablet Take 1 tablet by mouth every 4 (four) hours as needed for up to 10 doses for moderate pain or severe pain. 11/03/21   Ardis Rowan, PA-C  metoprolol (LOPRESSOR) 50 MG tablet Take 50 mg by mouth daily. 08/07/13   [provider]  mirtazapine (REMERON) 45 MG tablet Take 45 mg by mouth at bedtime. 09/02/21   [provider]  nicotine polacrilex (COMMIT) 2 MG lozenge Take 2 mg by mouth as needed (stress).    [provider]  nystatin-triamcinolone ointment (MYCOLOG) Apply 1 application topically 2 (two) times daily. 02/23/16   Florian Buff, MD  quinapril (ACCUPRIL) 40 MG tablet Take 40 mg by mouth every evening.  07/17/13   [provider]  venlafaxine XR (EFFEXOR-XR) 150 MG 24 hr capsule Take 150 mg by mouth every evening.  08/07/13   [provider]     Family History  Problem Relation Age of Onset   Lung cancer Mother        smoked   Heart disease Father    Heart attack Brother    Diabetes Brother    Stroke Brother    Obesity Other    Colon cancer Neg Hx    Liver disease Neg Hx  Social History   Socioeconomic History   Marital status: Married    Spouse name: Not on file   Number of children: Not on file   Years of education: Not on file   Highest education level: Not on file  Occupational History   Occupation: Retired    Fish farm manager: [employer]  Tobacco Use   Smoking status: Former    Packs/day: 1.00    Years: 25.00    Total pack years: 25.00    Types: Cigarettes    Quit date: 03/30/2011    Years since quitting: 10.8   Smokeless tobacco: Never  Vaping Use   Vaping Use: Never used  Substance and  Sexual Activity   Alcohol use: Yes    Alcohol/week: 0.0 standard drinks of alcohol    Comment: socially   Drug use: No   Sexual activity: Never    Birth control/protection: Post-menopausal  Other Topics Concern   Not on file  Social History Narrative   Not on file   Social Determinants of Health   Financial Resource Strain: Not on file  Food Insecurity: Not on file  Transportation Needs: Not on file  Physical Activity: Not on file  Stress: Not on file  Social Connections: Not on file    ECOG Status: 0 - Asymptomatic  Review of Systems  Constitutional: Negative.   Gastrointestinal: Negative.     Physical Exam No direct physical exam was performed Vital Signs: There were no vitals taken for this visit.  Imaging: MR ABDOMEN WWO CONTRAST  Result Date: 02/02/2022 CLINICAL DATA:  History of hepatocellular carcinoma status post microwave ablation. EXAM: MRI ABDOMEN WITHOUT AND WITH CONTRAST TECHNIQUE: Multiplanar multisequence MR imaging of the abdomen was performed both before and after the administration of intravenous contrast. CONTRAST:  68m GADAVIST GADOBUTROL 1 MMOL/ML IV SOLN COMPARISON:  Multiple priors including most recent MRI October 21, 2021 FINDINGS: Lower chest: Elevation of left hemidiaphragm. Mild cardiomegaly. Paraesophageal varices. Tiny hiatal hernia. Hepatobiliary: Cirrhotic hepatic morphology. Stable T2 hypointense segment VII/VIII hepatic lesion measures 6.0 x 3.8 cm on image 8/6 and unchanged over multiple prior examinations, likely reflecting a regenerative nodule. -Posttreatment change of microablation at the site of biopsy-proven hepatocellular carcinoma in the subcapsular portion of segment V and VI with this area demonstrating intrinsic hyperintense T1 signal measuring 1.6 x 1.1 cm on image 48/13 without evidence of suspicious postcontrast enhancement on subtraction images. LR TR nonviable Arterially hyperenhancing segment VII focus measures 7 mm on image  32/13, unchanged, without evidence of washout. LR 3 Arterially hyperenhancing focus within the central right hepatic lobe measuring 12 mm on image 49/13, unchanged, without evidence of washout. LR 3 Arterially hyperenhancing segment VIII focus measures 10 mm on image 35/13, without evidence of washout. LR 3 Distended gallbladder with gallbladder wall thickening, favored sequela of chronic hepatocellular disease. No biliary ductal dilation. Pancreas: No mass, inflammatory changes, or other parenchymal abnormality identified. Spleen:  No splenomegaly. Adrenals/Urinary Tract: Bilateral adrenal glands appear normal. Hydronephrosis. No suspicious renal mass. Stomach/Bowel: Visualized portions within the abdomen are unremarkable. Vascular/Lymphatic: Aortic atherosclerosis. The portal, splenic and superior mesenteric veins are patent. Upper abdominal collateral vessels. Other:  No significant abdominal free fluid. Musculoskeletal: No suspicious bone lesions identified. IMPRESSION: 1. Post microwave ablation change in the segment V/VI hepatic lesion without suspicious postcontrast enhancement. LR TR nonviable. 2. New arterially hyperenhancing 10 mm segment VIII lesion consistent with a LR 3 hepatic lesion. 3. Similar size of the 2 additional LR 3 lesions in the right  lobe of the liver. 4. Cirrhotic hepatic morphology with evidence of portal venous hypertension. 5.  Aortic Atherosclerosis (ICD10-I70.0). Electronically Signed   By: Dahlia Bailiff M.D.   On: 02/02/2022 14:13    Labs:  CBC: Recent Labs    09/17/21 1313 10/13/21 0702 10/25/21 1338 11/16/21 1307  WBC 2.5* 2.6* 3.9* 2.8*  HGB 13.9 15.1* 15.4* 14.7  HCT 41.8 45.5 45.6 43.1  PLT 95* 104* 104* 85*    COAGS: Recent Labs    09/17/21 1313 10/25/21 1338  INR 1.2 1.1    BMP: Recent Labs    09/17/21 1313 10/13/21 0702 10/25/21 1338  NA 139 138 139  K 3.5 3.6 3.9  CL 103 107 105  CO2 '29 26 29  '$ GLUCOSE 159* 91 106*  BUN '10 8 10  '$ CALCIUM  8.7* 8.7* 9.0  CREATININE 0.72 0.78 0.81  GFRNONAA >60 >60 >60    LIVER FUNCTION TESTS: Recent Labs    09/17/21 1313 10/13/21 0702 10/25/21 1338  BILITOT 1.6* 1.6* 1.7*  AST 53* 56* 49*  ALT '23 20 22  '$ ALKPHOS 104 128* 119  PROT 8.0 8.3* 8.4*  ALBUMIN 3.5 3.5 3.7    TUMOR MARKERS: No results for input(s): "AFPTM", "CEA", "CA199", "CHROMGRNA" in the last 8760 hours.  Assessment and Plan:  71 year old with cirrhosis and biopsy-proven hepatocellular carcinoma.  2 cm capsular lesion in the right hepatic lobe was treated with microwave ablation.  Recent follow-up MRI shows expected post ablation changes without evidence of residual disease at the ablation site.  The patient has 1 new enhancing lesion in the liver that measures up to 1 cm and this is a LI-RADS 3.  There are 2 additional enhancing LI-RADS 3 lesions in the liver.  We discussed the MRI findings.  I explained to the patient that these these lesions are intermediate probability for hepatocellular carcinoma.  Patient requires close surveillance of her liver disease and recommend surveillance of these LI-RADS 3 lesions at this time rather than pursuing biopsy or treatment of the lesions.  If we identify significant change or growth in these lesions, that we can discuss additional liver directed therapy as needed.  Patient is comfortable with surveillance at this time.  Plan for follow-up MRI of the abdomen, with and without contrast, in 3 months followed by another clinic visit.  Thank you for this interesting consult.  I greatly enjoyed meeting Raven Lee and look forward to participating in their care.  A copy of this report was sent to the requesting provider on this date.  Electronically Signed: Burman Riis 02/08/2022, 11:01 AM   I spent a total of    5 Minutes in remote  clinical consultation, greater than 50% of which was counseling/coordinating care for hepatocellular carcinoma.    Visit type: Audio only (telephone).  Audio (no video) only due to patient preference. Alternative for in-person consultation at [facility], [address], [place], [place]. This visit type was conducted due to national recommendations for restrictions regarding the COVID-19 Pandemic (e.g. social distancing).  This format is felt to be most appropriate for this patient at this time.  All issues noted in this document were discussed and addressed.   Patient ID: Raven Lee, female   DOB: 1951/01/22, 71 y.o.   MRN: [id number]

## 2022-02-22 DIAGNOSIS — Z6829 Body mass index (BMI) 29.0-29.9, adult: Secondary | ICD-10-CM | POA: Diagnosis not present

## 2022-02-22 DIAGNOSIS — Z9884 Bariatric surgery status: Secondary | ICD-10-CM | POA: Diagnosis not present

## 2022-02-22 DIAGNOSIS — E663 Overweight: Secondary | ICD-10-CM | POA: Diagnosis not present

## 2022-02-22 DIAGNOSIS — R69 Illness, unspecified: Secondary | ICD-10-CM | POA: Diagnosis not present

## 2022-02-22 DIAGNOSIS — F329 Major depressive disorder, single episode, unspecified: Secondary | ICD-10-CM | POA: Diagnosis not present

## 2022-02-22 DIAGNOSIS — K219 Gastro-esophageal reflux disease without esophagitis: Secondary | ICD-10-CM | POA: Diagnosis not present

## 2022-03-14 ENCOUNTER — Inpatient Hospital Stay (HOSPITAL_COMMUNITY): Payer: Medicare HMO | Attending: Hematology

## 2022-03-14 DIAGNOSIS — R161 Splenomegaly, not elsewhere classified: Secondary | ICD-10-CM | POA: Insufficient documentation

## 2022-03-14 DIAGNOSIS — K7460 Unspecified cirrhosis of liver: Secondary | ICD-10-CM | POA: Insufficient documentation

## 2022-03-14 DIAGNOSIS — Z09 Encounter for follow-up examination after completed treatment for conditions other than malignant neoplasm: Secondary | ICD-10-CM | POA: Insufficient documentation

## 2022-03-14 DIAGNOSIS — D72819 Decreased white blood cell count, unspecified: Secondary | ICD-10-CM | POA: Insufficient documentation

## 2022-03-14 DIAGNOSIS — Z8619 Personal history of other infectious and parasitic diseases: Secondary | ICD-10-CM | POA: Insufficient documentation

## 2022-03-14 DIAGNOSIS — K766 Portal hypertension: Secondary | ICD-10-CM | POA: Insufficient documentation

## 2022-03-14 DIAGNOSIS — K769 Liver disease, unspecified: Secondary | ICD-10-CM | POA: Insufficient documentation

## 2022-03-14 DIAGNOSIS — D696 Thrombocytopenia, unspecified: Secondary | ICD-10-CM | POA: Insufficient documentation

## 2022-03-14 DIAGNOSIS — Z08 Encounter for follow-up examination after completed treatment for malignant neoplasm: Secondary | ICD-10-CM | POA: Insufficient documentation

## 2022-03-14 DIAGNOSIS — Z8505 Personal history of malignant neoplasm of liver: Secondary | ICD-10-CM | POA: Insufficient documentation

## 2022-03-21 ENCOUNTER — Ambulatory Visit (HOSPITAL_COMMUNITY): Payer: Medicare HMO | Admitting: Hematology

## 2022-03-23 ENCOUNTER — Inpatient Hospital Stay (HOSPITAL_COMMUNITY): Payer: Medicare HMO

## 2022-03-23 DIAGNOSIS — K766 Portal hypertension: Secondary | ICD-10-CM | POA: Diagnosis not present

## 2022-03-23 DIAGNOSIS — K769 Liver disease, unspecified: Secondary | ICD-10-CM | POA: Diagnosis not present

## 2022-03-23 DIAGNOSIS — Z8619 Personal history of other infectious and parasitic diseases: Secondary | ICD-10-CM | POA: Diagnosis not present

## 2022-03-23 DIAGNOSIS — Z09 Encounter for follow-up examination after completed treatment for conditions other than malignant neoplasm: Secondary | ICD-10-CM | POA: Diagnosis not present

## 2022-03-23 DIAGNOSIS — D72819 Decreased white blood cell count, unspecified: Secondary | ICD-10-CM | POA: Diagnosis not present

## 2022-03-23 DIAGNOSIS — R161 Splenomegaly, not elsewhere classified: Secondary | ICD-10-CM | POA: Diagnosis not present

## 2022-03-23 DIAGNOSIS — Z8505 Personal history of malignant neoplasm of liver: Secondary | ICD-10-CM | POA: Diagnosis not present

## 2022-03-23 DIAGNOSIS — D696 Thrombocytopenia, unspecified: Secondary | ICD-10-CM

## 2022-03-23 DIAGNOSIS — K7460 Unspecified cirrhosis of liver: Secondary | ICD-10-CM | POA: Diagnosis not present

## 2022-03-23 DIAGNOSIS — Z08 Encounter for follow-up examination after completed treatment for malignant neoplasm: Secondary | ICD-10-CM | POA: Diagnosis not present

## 2022-03-23 LAB — COMPREHENSIVE METABOLIC PANEL
ALT: 24 U/L (ref 0–44)
AST: 50 U/L — ABNORMAL HIGH (ref 15–41)
Albumin: 3.3 g/dL — ABNORMAL LOW (ref 3.5–5.0)
Alkaline Phosphatase: 100 U/L (ref 38–126)
Anion gap: 6 (ref 5–15)
BUN: 11 mg/dL (ref 8–23)
CO2: 28 mmol/L (ref 22–32)
Calcium: 8.7 mg/dL — ABNORMAL LOW (ref 8.9–10.3)
Chloride: 108 mmol/L (ref 98–111)
Creatinine, Ser: 0.81 mg/dL (ref 0.44–1.00)
GFR, Estimated: >60 mL/min (ref >60)
Glucose, Bld: 103 mg/dL — ABNORMAL HIGH (ref 70–99)
Potassium: 3.6 mmol/L (ref 3.5–5.1)
Sodium: 142 mmol/L (ref 135–145)
Total Bilirubin: 1.5 mg/dL — ABNORMAL HIGH (ref 0.3–1.2)
Total Protein: 7.4 g/dL (ref 6.5–8.1)

## 2022-03-23 LAB — CBC WITH DIFFERENTIAL/PLATELET
Abs Immature Granulocytes: 0.02 10*3/uL (ref 0.00–0.07)
Basophils Absolute: 0 10*3/uL (ref 0.0–0.1)
Basophils Relative: 1 %
Eosinophils Absolute: 0 10*3/uL (ref 0.0–0.5)
Eosinophils Relative: 0 %
HCT: 39.8 % (ref 36.0–46.0)
Hemoglobin: 13.3 g/dL (ref 12.0–15.0)
Immature Granulocytes: 1 %
Lymphocytes Relative: 29 %
Lymphs Abs: 0.8 10*3/uL (ref 0.7–4.0)
MCH: 33.4 pg (ref 26.0–34.0)
MCHC: 33.4 g/dL (ref 30.0–36.0)
MCV: 100 fL (ref 80.0–100.0)
Monocytes Absolute: 0.4 10*3/uL (ref 0.1–1.0)
Monocytes Relative: 15 %
Neutro Abs: 1.5 10*3/uL — ABNORMAL LOW (ref 1.7–7.7)
Neutrophils Relative %: 54 %
Platelets: 102 10*3/uL — ABNORMAL LOW (ref 150–400)
RBC: 3.98 MIL/uL (ref 3.87–5.11)
RDW: 14.1 % (ref 11.5–15.5)
WBC: 2.8 10*3/uL — ABNORMAL LOW (ref 4.0–10.5)
nRBC: 0 % (ref 0.0–0.2)

## 2022-03-23 LAB — LACTATE DEHYDROGENASE: LDH: 247 U/L — ABNORMAL HIGH (ref 98–192)

## 2022-03-24 LAB — AFP TUMOR MARKER: AFP, Serum, Tumor Marker: 6.8 ng/mL (ref 0.0–9.2)

## 2022-03-30 ENCOUNTER — Inpatient Hospital Stay: Payer: Medicare HMO | Admitting: Hematology

## 2022-04-04 ENCOUNTER — Inpatient Hospital Stay: Payer: Medicare HMO | Admitting: Hematology

## 2022-04-14 ENCOUNTER — Other Ambulatory Visit: Payer: Self-pay | Admitting: Diagnostic Radiology

## 2022-04-14 ENCOUNTER — Other Ambulatory Visit: Payer: Self-pay | Admitting: *Deleted

## 2022-04-14 DIAGNOSIS — C220 Liver cell carcinoma: Secondary | ICD-10-CM

## 2022-04-14 NOTE — Patient Outreach (Signed)
  Care Coordination   04/14/2022  Name: Raven Lee MRN: [id number] DOB: 12/11/1950   Care Coordination Outreach Attempts:  An unsuccessful telephone outreach was attempted today to offer the patient information about available care coordination services as a benefit of their health plan. HIPAA compliant message left on voicemail, providing contact information for CSW, encouraging patient to return CSW's call at her earliest convenience.  Follow Up Plan:  Additional outreach attempts will be made to offer the patient care coordination information and services.   Encounter Outcome:  No Answer.   Care Coordination Interventions Activated:  No.    Care Coordination Interventions:  No, not indicated.    Nat Christen, BSW, MSW, LCSW  Licensed Education officer, [facility]  Mailing [place] [address], [place], [place] [address] Physical Address-[address] [place], [place] [address] Toll Free Main # [phone] Fax # [phone] Cell # [phone] [email or web address]

## 2022-04-18 ENCOUNTER — Inpatient Hospital Stay: Payer: Medicare HMO | Attending: Hematology | Admitting: Hematology

## 2022-04-18 VITALS — BP 173/94 | HR 71 | Temp 98.1°F | Resp 18 | Ht 61.5 in | Wt 159.7 lb

## 2022-04-18 DIAGNOSIS — D696 Thrombocytopenia, unspecified: Secondary | ICD-10-CM | POA: Insufficient documentation

## 2022-04-18 DIAGNOSIS — D72819 Decreased white blood cell count, unspecified: Secondary | ICD-10-CM | POA: Insufficient documentation

## 2022-04-18 NOTE — Progress Notes (Signed)
[facility] [place], [place] [address]   CLINIC:  Medical Oncology/Hematology  PCP:  Celene Squibb, MD [address] [place] [address]  [phone]  REASON FOR VISIT:  Follow-up for leukopenia and thrombocytopenia   PRIOR THERAPY: none  CURRENT THERAPY: surveillance  INTERVAL HISTORY:  [name], a 71 y.o. female, seen for follow-up of liver cancer and leukopenia and thrombocytopenia.  Denies any B symptoms.  Appetite is 100% and energy levels are 80%.  Denies any infections in the last 6 months.  No bleeding issues reported.  REVIEW OF SYSTEMS:  Review of Systems  Constitutional:  Negative for appetite change, fatigue, fever and unexpected weight change.  Respiratory:  Negative for shortness of breath.   Endocrine: Negative for hot flashes.  Neurological:  Positive for numbness (Tip of right thumb).  All other systems reviewed and are negative.   PAST MEDICAL/SURGICAL HISTORY:  Past Medical History:  Diagnosis Date   Anxiety    Difficulty sleeping    Emphysema/COPD ([place])    Hepatitis    HEPATITIS C   History of panic attacks    Hypertension    Liver lesion    Pneumonia    Psoriasis    Past Surgical History:  Procedure Laterality Date   ABDOMINAL HYSTERECTOMY     COLONOSCOPY  2009   [facility]: diverticulosis   IR RADIOLOGIST EVAL & MGMT  09/01/2021   IR RADIOLOGIST EVAL & MGMT  02/08/2022   LAPAROSCOPIC GASTRIC SLEEVE RESECTION N/A 12/01/2014   Procedure: LAPAROSCOPIC GASTRIC SLEEVE RESECTION UPPER ENDO;  Surgeon: Excell Seltzer, MD;  Location: [facility];  Service: General;  Laterality: N/A;   LUNG LOBECTOMY  03/2011   LLL-severe PNA   RADIOFREQUENCY ABLATION N/A 10/13/2021   Procedure: CT MICROWAVE ABLATION;  Surgeon: Markus Daft, MD;  Location: [facility];  Service: Anesthesiology;  Laterality: N/A;   RADIOFREQUENCY ABLATION N/A 11/03/2021   Procedure: CT MICROWAVE ABLATION;  Surgeon: Markus Daft, MD;  Location: [facility];   Service: Anesthesiology;  Laterality: N/A;   WISDOM TOOTH EXTRACTION      SOCIAL HISTORY:  Social History   Socioeconomic History   Marital status: Married    Spouse name: Not on file   Number of children: Not on file   Years of education: Not on file   Highest education level: Not on file  Occupational History   Occupation: Retired    Fish farm manager: [employer]  Tobacco Use   Smoking status: Former    Packs/day: 1.00    Years: 25.00    Total pack years: 25.00    Types: Cigarettes    Quit date: 03/30/2011    Years since quitting: 11.0   Smokeless tobacco: Never  Vaping Use   Vaping Use: Never used  Substance and Sexual Activity   Alcohol use: Yes    Alcohol/week: 0.0 standard drinks of alcohol    Comment: socially   Drug use: No   Sexual activity: Never    Birth control/protection: Post-menopausal  Other Topics Concern   Not on file  Social History Narrative   Not on file   Social Determinants of Health   Financial Resource Strain: Not on file  Food Insecurity: Not on file  Transportation Needs: Not on file  Physical Activity: Not on file  Stress: Not on file  Social Connections: Not on file  Intimate Partner Violence: Not on file    FAMILY HISTORY:  Family History  Problem  Relation Age of Onset   Lung cancer Mother        smoked   Heart disease Father    Heart attack Brother    Diabetes Brother    Stroke Brother    Obesity Other    Colon cancer Neg Hx    Liver disease Neg Hx     CURRENT MEDICATIONS:  Current Outpatient Medications  Medication Sig Dispense Refill   hydrochlorothiazide (HYDRODIURIL) 25 MG tablet Take 25 mg by mouth every evening.      HYDROcodone-acetaminophen (NORCO) 5-325 MG tablet Take 1 tablet by mouth every 4 (four) hours as needed for up to 10 doses for moderate pain or severe pain. 10 tablet 0   metoprolol (LOPRESSOR) 50 MG tablet Take 50 mg by mouth daily.     mirtazapine (REMERON) 45 MG tablet Take 45 mg by mouth at bedtime.      nicotine polacrilex (COMMIT) 2 MG lozenge Take 2 mg by mouth as needed (stress).     nystatin-triamcinolone ointment (MYCOLOG) Apply 1 application topically 2 (two) times daily. 30 g 11   omeprazole (PRILOSEC) 20 MG capsule Take 20 mg by mouth daily.     quinapril (ACCUPRIL) 40 MG tablet Take 40 mg by mouth every evening.      venlafaxine XR (EFFEXOR-XR) 150 MG 24 hr capsule Take 150 mg by mouth every evening.      VRAYLAR 1.5 MG capsule Take 1.5 mg by mouth daily.     No current facility-administered medications for this visit.    ALLERGIES:  Allergies  Allergen Reactions   Oxycodone Other (See Comments)    Hallucinations    PHYSICAL EXAM:  Performance status (ECOG): 0 - Asymptomatic  Vitals:   04/18/22 1533  BP: (!) 173/94  Pulse: 71  Resp: 18  Temp: 98.1 F (36.7 C)  SpO2: 100%   Wt Readings from Last 3 Encounters:  04/18/22 159 lb 11.2 oz (72.4 kg)  12/13/21 156 lb 3.2 oz (70.9 kg)  11/03/21 152 lb 1.9 oz (69 kg)   Physical Exam Vitals reviewed.  Constitutional:      Appearance: Normal appearance.  Cardiovascular:     Rate and Rhythm: Normal rate and regular rhythm.     Pulses: Normal pulses.     Heart sounds: Normal heart sounds.  Pulmonary:     Effort: Pulmonary effort is normal.     Breath sounds: Normal breath sounds.  Abdominal:     Palpations: Abdomen is soft. There is no hepatomegaly, splenomegaly or mass.     Tenderness: There is no abdominal tenderness.  Neurological:     General: No focal deficit present.     Mental Status: She is alert and oriented to person, place, and time.  Psychiatric:        Mood and Affect: Mood normal.        Behavior: Behavior normal.     LABORATORY DATA:  I have reviewed the labs as listed.     Latest Ref Rng & Units 03/23/2022   12:24 PM 11/16/2021    1:07 PM 10/25/2021    1:38 PM  CBC  WBC 4.0 - 10.5 K/uL 2.8  2.8  3.9   Hemoglobin 12.0 - 15.0 g/dL 13.3  14.7  15.4   Hematocrit 36.0 - 46.0 % 39.8  43.1  45.6    Platelets 150 - 400 K/uL 102  85  104       Latest Ref Rng & Units 03/23/2022   12:24 PM  10/25/2021    1:38 PM 10/13/2021    7:02 AM  CMP  Glucose 70 - 99 mg/dL 103  106  91   BUN 8 - 23 mg/dL '11  10  8   '$ Creatinine 0.44 - 1.00 mg/dL 0.81  0.81  0.78   Sodium 135 - 145 mmol/L 142  139  138   Potassium 3.5 - 5.1 mmol/L 3.6  3.9  3.6   Chloride 98 - 111 mmol/L 108  105  107   CO2 22 - 32 mmol/L '28  29  26   '$ Calcium 8.9 - 10.3 mg/dL 8.7  9.0  8.7   Total Protein 6.5 - 8.1 g/dL 7.4  8.4  8.3   Total Bilirubin 0.3 - 1.2 mg/dL 1.5  1.7  1.6   Alkaline Phos 38 - 126 U/L 100  119  128   AST 15 - 41 U/L 50  49  56   ALT 0 - 44 U/L '24  22  20       '$ Component Value Date/Time   RBC 3.98 03/23/2022 1224   MCV 100.0 03/23/2022 1224   MCV 97 11/13/2017 1228   MCH 33.4 03/23/2022 1224   MCHC 33.4 03/23/2022 1224   RDW 14.1 03/23/2022 1224   RDW 14.6 11/13/2017 1228   LYMPHSABS 0.8 03/23/2022 1224   LYMPHSABS 1.2 11/13/2017 1228   MONOABS 0.4 03/23/2022 1224   EOSABS 0.0 03/23/2022 1224   EOSABS 0.0 11/13/2017 1228   BASOSABS 0.0 03/23/2022 1224   BASOSABS 0.0 11/13/2017 1228    DIAGNOSTIC IMAGING:  I have independently reviewed the scans and discussed with the patient. No results found.   ASSESSMENT:  1.  Leukopenia and thrombocytopenia: - Patient seen at the request of Dr. Juel Burrow office. - CBC on 02/15/2021 with white count 2.4, ANC 1.3, platelet count 84.  Hemoglobin was normal at 15.1.  There was question of aggregation of platelets. - She has mildly decreased platelet count since 2016. - White count is decreased since 2018. - No B symptoms or recurrent infections. 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 - MRI of the abdomen on 09/12/2019 with hepatic steatosis, cirrhosis with stigmata of portal venous hypertension including borderline splenomegaly and varices.  2.  Social/family history: - She lives at home with her husband. - She smoked 1 and half  pack per day for 40 years and quit in 2012. - She is currently working at [employer] daily.  She worked for a [employer] prior to that.  No history of chemical exposure. - No family history of low platelets.  Mother died of lung cancer.  Maternal aunt died of cancer.  3.  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 - Evaluated by transplant team on 10/11/2021 not a candidate for transplant. - Microwave ablation on 11/03/2021 by Dr. Anselm Pancoast.     PLAN:  1.  Leukopenia and thrombocytopenia: - Previous work-up for nutritional deficiencies and infectious etiologies negative. 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  Spleen size was normal on previous imaging. - Labs show white count 2.8 and ANC of 1.5 which is stable.  Platelet count is also stable at 102. - RTC 6 months for follow-up with repeat labs.  2.  Well to moderately differentiated [place]: - Labs from 03/23/2022 shows AFP of 6.8. - Reviewed MRI abdomen from 02/01/2022 which showed new arterial enhancing 10 mm segment 8 lesion consistent with a LR3 hepatic lesion. - She  has another MRI scheduled on 05/13/2022.  RTC 6 months with repeat AFP.    Orders placed this encounter:  Orders Placed This Encounter  Procedures   CBC with Differential/Platelet   Comprehensive metabolic panel   AFP tumor marker      Derek Jack, MD [facility] [phone]

## 2022-04-18 NOTE — Patient Instructions (Signed)
[place] at [facility] Discharge Instructions  You were seen and examined today by Dr. Delton Coombes.  Dr. Delton Coombes discussed your most recent lab work and everything is good and stable.   Follow-up as scheduled in 6 months.    Thank you for choosing [place] at [facility] to provide your oncology and hematology care.  To afford each patient quality time with our provider, please arrive at least 15 minutes before your scheduled appointment time.   If you have a lab appointment with the [facility] please come in thru the Main Entrance and check in at the main information desk.  You need to re-schedule your appointment should you arrive 10 or more minutes late.  We strive to give you quality time with our providers, and arriving late affects you and other patients whose appointments are after yours.  Also, if you no show three or more times for appointments you may be dismissed from the clinic at the providers discretion.     Again, thank you for choosing [facility].  Our hope is that these requests will decrease the amount of time that you wait before being seen by our physicians.       _____________________________________________________________  Should you have questions after your visit to [facility], please contact our office at [phone] and follow the prompts.  Our office hours are 8:00 a.m. and 4:30 p.m. Monday - Friday.  Please note that voicemails left after 4:00 p.m. may not be returned until the following business day.  We are closed weekends and major holidays.  You do have access to a nurse 24-7, just call the main number to the clinic [phone] and do not press any options, hold on the line and a nurse will answer the phone.    For prescription refill requests, have your pharmacy contact our office and allow 72 hours.

## 2022-04-21 ENCOUNTER — Encounter: Payer: Self-pay | Admitting: *Deleted

## 2022-04-21 ENCOUNTER — Ambulatory Visit: Payer: Medicare HMO | Admitting: *Deleted

## 2022-04-21 NOTE — Patient Outreach (Signed)
  Care Coordination   Initial Visit Note   04/21/2022  Name: Raven Lee MRN: [id number] DOB: 02-06-1951  Raven Lee is a 71 y.o. year old female who sees [place] Crane, Edwinna Areola, MD for primary care. I spoke with Star Age by phone today.  What matters to the patients health and wellness today?  No Interventions Identified.   Goals Addressed   None     SDOH assessments and interventions completed:  Yes.    SDOH Interventions Today    Flowsheet Row Most Recent Value  SDOH Interventions   Food Insecurity Interventions Intervention Not Indicated  Financial Strain Interventions Intervention Not Indicated  Housing Interventions Intervention Not Indicated  Physical Activity Interventions Patient Refused  Stress Interventions Intervention Not Indicated  Social Connections Interventions Intervention Not Indicated  Transportation Interventions Intervention Not Indicated         Care Coordination Interventions Activated:  Yes.   Care Coordination Interventions:  Yes, provided.   Follow up plan: No further intervention required.   Encounter Outcome:  Pt. Visit Completed.   Nat Christen, BSW, MSW, LCSW  Licensed Education officer, [facility]  Mailing [place] [address] [place], [place] [address] Physical Address-[address], [place], [place] [address] Toll Free Main # [phone] Fax # [phone] Cell # [phone] [email or web address]

## 2022-04-21 NOTE — Patient Instructions (Signed)
Visit Information  Thank you for taking time to visit with me today. Please don't hesitate to contact me if I can be of assistance to you.   Please call the care guide team at [phone] if you need to cancel or reschedule your appointment.   If you are experiencing a Mental Health or Behavioral Health Crisis or need someone to talk to, please call the Suicide and Crisis Lifeline: 988 call the [place] National Suicide Prevention Lifeline: 1-800-273-8255 or TTY: 1-800-799-4 TTY (1-800-799-4889) to talk to a trained counselor call 1-800-273-TALK (toll free, 24 hour hotline) go to [facility] [address], [place] ([phone]) call the [place] Crisis Line: 800-939-9988 call 911  Patient verbalizes understanding of instructions and care plan provided today and agrees to view in MyChart. Active MyChart status and patient understanding of how to access instructions and care plan via MyChart confirmed with patient.     No further follow up required.  Keshaun Dubey, BSW, MSW, LCSW  Licensed Clinical Social Worker  [facility]  Mailing Address-[address], [place], [place] [address] Physical Address-[address], , [place] [address] Toll Free Main # [phone] Fax # [phone] Cell # [phone] [email or web address]            

## 2022-05-11 ENCOUNTER — Ambulatory Visit (HOSPITAL_COMMUNITY): Admission: RE | Admit: 2022-05-11 | Payer: Medicare HMO | Source: Ambulatory Visit

## 2022-05-13 ENCOUNTER — Telehealth: Payer: Medicare HMO

## 2022-05-20 DIAGNOSIS — F329 Major depressive disorder, single episode, unspecified: Secondary | ICD-10-CM | POA: Diagnosis not present

## 2022-05-20 DIAGNOSIS — Z23 Encounter for immunization: Secondary | ICD-10-CM | POA: Diagnosis not present

## 2022-05-20 DIAGNOSIS — R69 Illness, unspecified: Secondary | ICD-10-CM | POA: Diagnosis not present

## 2022-05-31 ENCOUNTER — Ambulatory Visit (HOSPITAL_COMMUNITY)
Admission: RE | Admit: 2022-05-31 | Discharge: 2022-05-31 | Disposition: A | Discharge status: Home / Self Care | Payer: Medicare HMO | Source: Ambulatory Visit | Attending: Diagnostic Radiology | Admitting: Diagnostic Radiology

## 2022-05-31 DIAGNOSIS — C220 Liver cell carcinoma: Secondary | ICD-10-CM | POA: Diagnosis not present

## 2022-05-31 DIAGNOSIS — Z8505 Personal history of malignant neoplasm of liver: Secondary | ICD-10-CM | POA: Diagnosis not present

## 2022-05-31 DIAGNOSIS — K7581 Nonalcoholic steatohepatitis (NASH): Secondary | ICD-10-CM | POA: Diagnosis not present

## 2022-05-31 DIAGNOSIS — K7460 Unspecified cirrhosis of liver: Secondary | ICD-10-CM | POA: Diagnosis not present

## 2022-05-31 DIAGNOSIS — K766 Portal hypertension: Secondary | ICD-10-CM | POA: Diagnosis not present

## 2022-05-31 MED ORDER — GADOPICLENOL 0.5 MMOL/ML IV SOLN
7.0000 mL | Freq: Once | INTRAVENOUS | Status: AC | PRN
  Administered 2022-05-31: 7 mL via INTRAVENOUS

## 2022-06-07 ENCOUNTER — Encounter: Payer: Self-pay | Admitting: *Deleted

## 2022-06-07 ENCOUNTER — Ambulatory Visit
Admission: RE | Admit: 2022-06-07 | Discharge: 2022-06-07 | Disposition: A | Discharge status: Home / Self Care | Payer: Medicare HMO | Source: Ambulatory Visit | Attending: Diagnostic Radiology | Admitting: Diagnostic Radiology

## 2022-06-07 DIAGNOSIS — K7460 Unspecified cirrhosis of liver: Secondary | ICD-10-CM | POA: Diagnosis not present

## 2022-06-07 DIAGNOSIS — C220 Liver cell carcinoma: Secondary | ICD-10-CM

## 2022-06-07 DIAGNOSIS — Z9889 Other specified postprocedural states: Secondary | ICD-10-CM | POA: Diagnosis not present

## 2022-06-07 HISTORY — PX: IR RADIOLOGIST EVAL & MGMT: IMG5224

## 2022-06-07 NOTE — Progress Notes (Signed)
Chief Complaint: Patient was consulted remotely today (TeleHealth) for follow hepatocellular carcinoma.  Referring Physician(s): Katragadda  History of Present Illness: Raven Lee is a 71 y.o. female with history of hepatitis C, cirrhosis and biopsy-proven hepatocellular carcinoma.  Patient underwent image guided microwave ablation of a right capsular lesion on 11/03/2021.  Patient has tolerated the procedure very well.  She recently had a follow-up MRI on 05/31/2022.  Patient says there is no change in her health.  She has no chest or abdominal pain.  No breathing problems.  No change in her weight or appetite.  Patient is getting ready to go for a 3-week trip to [place].  Past Medical History:  Diagnosis Date   Anxiety    Difficulty sleeping    Emphysema/COPD ([place])    Hepatitis    HEPATITIS C   History of panic attacks    Hypertension    Liver lesion    Pneumonia    Psoriasis     Past Surgical History:  Procedure Laterality Date   ABDOMINAL HYSTERECTOMY     COLONOSCOPY  2009   [facility]: diverticulosis   IR RADIOLOGIST EVAL & MGMT  09/01/2021   IR RADIOLOGIST EVAL & MGMT  02/08/2022   LAPAROSCOPIC GASTRIC SLEEVE RESECTION N/A 12/01/2014   Procedure: LAPAROSCOPIC GASTRIC SLEEVE RESECTION UPPER ENDO;  Surgeon: Excell Seltzer, MD;  Location: [facility];  Service: General;  Laterality: N/A;   LUNG LOBECTOMY  03/2011   LLL-severe PNA   RADIOFREQUENCY ABLATION N/A 10/13/2021   Procedure: CT MICROWAVE ABLATION;  Surgeon: Markus Daft, MD;  Location: [facility];  Service: Anesthesiology;  Laterality: N/A;   RADIOFREQUENCY ABLATION N/A 11/03/2021   Procedure: CT MICROWAVE ABLATION;  Surgeon: Markus Daft, MD;  Location: [facility];  Service: Anesthesiology;  Laterality: N/A;   WISDOM TOOTH EXTRACTION      Allergies: Oxycodone  Medications: Prior to Admission medications   Medication Sig Start Date End Date Taking? Authorizing Provider  hydrochlorothiazide (HYDRODIURIL) 25 MG tablet Take  25 mg by mouth every evening.  08/07/13   [provider]  HYDROcodone-acetaminophen (NORCO) 5-325 MG tablet Take 1 tablet by mouth every 4 (four) hours as needed for up to 10 doses for moderate pain or severe pain. 11/03/21   Ardis Rowan, PA-C  metoprolol (LOPRESSOR) 50 MG tablet Take 50 mg by mouth daily. 08/07/13   [provider]  mirtazapine (REMERON) 45 MG tablet Take 45 mg by mouth at bedtime. 09/02/21   [provider]  nicotine polacrilex (COMMIT) 2 MG lozenge Take 2 mg by mouth as needed (stress).    [provider]  nystatin-triamcinolone ointment (MYCOLOG) Apply 1 application topically 2 (two) times daily. 02/23/16   Florian Buff, MD  omeprazole (PRILOSEC) 20 MG capsule Take 20 mg by mouth daily. 02/22/22   [provider]  quinapril (ACCUPRIL) 40 MG tablet Take 40 mg by mouth every evening.  07/17/13   [provider]  venlafaxine XR (EFFEXOR-XR) 150 MG 24 hr capsule Take 150 mg by mouth every evening.  08/07/13   [provider]  VRAYLAR 1.5 MG capsule Take 1.5 mg by mouth daily. 03/30/22   [provider]     Family History  Problem Relation Age of Onset   Lung cancer Mother        smoked   Heart disease Father    Heart attack Brother    Diabetes Brother    Stroke Brother    Obesity Other  Colon cancer Neg Hx    Liver disease Neg Hx     Social History   Socioeconomic History   Marital status: Married    Spouse name: Tessla Spurling   Number of children: Not on file   Years of education: 12   Highest education level: 12th grade  Occupational History   Occupation: Retired    Fish farm manager: [employer]  Tobacco Use   Smoking status: Former    Packs/day: 1.00    Years: 25.00    Total pack years: 25.00    Types: Cigarettes    Quit date: 03/30/2011    Years since quitting: 11.1    Passive exposure: Past   Smokeless tobacco: Never  Vaping Use   Vaping Use: Never used  Substance and Sexual  Activity   Alcohol use: Yes    Alcohol/week: 0.0 standard drinks of alcohol    Comment: socially   Drug use: No   Sexual activity: Not Currently    Partners: Male    Birth control/protection: Post-menopausal  Other Topics Concern   Not on file  Social History Narrative   Not on file   Social Determinants of Health   Financial Resource Strain: Low Risk  (04/21/2022)   Overall Financial Resource Strain (CARDIA)    Difficulty of Paying Living Expenses: Not hard at all  Food Insecurity: No Food Insecurity (04/21/2022)   Hunger Vital Sign    Worried About Running Out of Food in the Last Year: Never true    [place] in the Last Year: Never true  Transportation Needs: No Transportation Needs (04/21/2022)   PRAPARE - Hydrologist (Medical): No    Lack of Transportation (Non-Medical): No  Physical Activity: Inactive (04/21/2022)   Exercise Vital Sign    Days of Exercise per Week: 0 days    Minutes of Exercise per Session: 0 min  Stress: No Stress Concern Present (04/21/2022)   [place]    Feeling of Stress : Only a little  Social Connections: Moderately Integrated (04/21/2022)   Social Connection and Isolation Panel [NHANES]    Frequency of Communication with Friends and Family: More than three times a week    Frequency of Social Gatherings with Friends and Family: More than three times a week    Attends Religious Services: Never    Marine scientist or Organizations: Yes    Attends Music therapist: More than 4 times per year    Marital Status: Married    ECOG Status: 0 - Asymptomatic  Review of Systems  Constitutional:  Negative for activity change and unexpected weight change.  Respiratory: Negative.    Cardiovascular: Negative.   Gastrointestinal: Negative.     Advance Care Plan: The advanced care plan/surrogate decision maker was discussed at the time of  visit  Physical Exam No direct physical exam was performed   Vital Signs: There were no vitals taken for this visit.  Imaging: MR ABDOMEN WWO CONTRAST  Result Date: 06/02/2022 CLINICAL DATA:  71 year old female with history of hepatocellular carcinoma status post microwave ablation. Follow-up study. EXAM: MRI ABDOMEN WITHOUT AND WITH CONTRAST TECHNIQUE: Multiplanar multisequence MR imaging of the abdomen was performed both before and after the administration of intravenous contrast. CONTRAST:  7 mL of Vueway COMPARISON:  Abdominal MRI 02/01/2022. FINDINGS: Lower chest: Unremarkable. Hepatobiliary: Liver has a shrunken appearance and nodular contour, indicative of advanced cirrhosis. Mild diffuse loss of  signal intensity throughout the hepatic parenchyma on out of phase dual echo images, indicative of a background of hepatic steatosis. There is also a lace-like pattern of T2 hyperintensity throughout the hepatic parenchyma which corresponds with areas of progressive delayed enhancement on post gadolinium imaging, indicative of a background of hepatic fibrosis. In the periphery of the right lobe of the liver (predominantly segments 5 and 6) there is a superficial region which is heterogeneous in signal intensity on T1 weighted images, predominantly T2 hypointense, and hypovascular on post gadolinium imaging, corresponding to site of prior microwave ablation. In the right lobe of the liver involving portions of segments 7 and 8 (axial image 9 of series 6) there is a well-circumscribed 6.2 x 4.0 cm lesion which is iso to slightly hyperintense on T1 weighted images, T2 hypointense, and generally unremarkable during post gadolinium imaging, with the exception of a small hypervascular focus along the superior margin of the lesion (axial image 31 of series 13) which measures 7 mm, which demonstrates no washout on more delayed phase post gadolinium imaging, stable compared to prior examinations. New small 8 mm  hypervascular focus in segment 4A (axial image 31 of series 13) not apparent on pre gadolinium T1 or T2 weighted images, and not evident on additional post gadolinium sequences, favored to represent a benign perfusion anomaly (transient hepatic intensity difference, THID). No other definite suspicious appearing hepatic lesions. Pancreas: No pancreatic mass. No pancreatic ductal dilatation. No pancreatic or peripancreatic fluid collections or inflammatory changes. Spleen: Spleen is enlarged measuring 11.8 x 5.3 x 12.9 cm (estimated splenic volume of 403 mL) . Adrenals/Urinary Tract: Bilateral kidneys and adrenal glands are normal in appearance. No hydroureteronephrosis in the visualized portions of the abdomen. Stomach/Bowel: Numerous large distal paraesophageal varices. Visualized portions are otherwise unremarkable. Vascular/Lymphatic: Atherosclerosis of the abdominal aorta without definite aneurysm in the abdominal vasculature. Numerous large distal paraesophageal varices. Portal vein is mildly dilated measuring 16 mm in the porta hepatis. No definite lymphadenopathy noted in the abdomen. Other: No significant volume of ascites noted in the visualized portions of the peritoneal cavity. Musculoskeletal: No aggressive appearing osseous lesions are noted in the visualized portions of the skeleton. IMPRESSION: 1. Stable examination demonstrating hepatic cirrhosis with hepatic fibrosis and a background of hepatic steatosis, with post ablation changes in the right lobe of the liver with no definitive findings to suggest locally recurrent disease. 2. Large lesion in the right lobe of the liver, stable compared to prior examinations, favored to represent a large regenerative nodule. Along the superior margin of this lesion there is a small 7 mm focus of arterial phase hyperenhancement without washout on delayed images, stable compared to the recent prior study, categorized as LR-3. 3. New hypervascular focus in segment 4A  measuring 8 mm. This is nonspecific and may represent a benign lesions such as a transient hepatic intensity difference (THID), as there is no perceivable lesion in this region on other pulse sequences, however, this is technically categorized as a new LR-3 lesion. Attention at time of follow-up imaging is recommended to ensure stability or regression. 4. Evidence of portal venous hypertension, including dilated portal vein, splenomegaly and portosystemic collateral pathways (most notable for dilated paraesophageal varices). 5. Aortic atherosclerosis. Electronically Signed   By: Vinnie Langton M.D.   On: 06/02/2022 08:31    Labs:  CBC: Recent Labs    10/13/21 0702 10/25/21 1338 11/16/21 1307 03/23/22 1224  WBC 2.6* 3.9* 2.8* 2.8*  HGB 15.1* 15.4* 14.7 13.3  HCT 45.5  45.6 43.1 39.8  PLT 104* 104* 85* 102*    COAGS: Recent Labs    09/17/21 1313 10/25/21 1338  INR 1.2 1.1    BMP: Recent Labs    09/17/21 1313 10/13/21 0702 10/25/21 1338 03/23/22 1224  NA 139 138 139 142  K 3.5 3.6 3.9 3.6  CL 103 107 105 108  CO2 '29 26 29 28  '$ GLUCOSE 159* 91 106* 103*  BUN '10 8 10 11  '$ CALCIUM 8.7* 8.7* 9.0 8.7*  CREATININE 0.72 0.78 0.81 0.81  GFRNONAA >60 >60 >60 >60    LIVER FUNCTION TESTS: Recent Labs    09/17/21 1313 10/13/21 0702 10/25/21 1338 03/23/22 1224  BILITOT 1.6* 1.6* 1.7* 1.5*  AST 53* 56* 49* 50*  ALT '23 20 22 24  '$ ALKPHOS 104 128* 119 100  PROT 8.0 8.3* 8.4* 7.4  ALBUMIN 3.5 3.5 3.7 3.3*    TUMOR MARKERS: AFP 6.8 on 03/23/22  Assessment and Plan:  71 year old female with cirrhosis and a history of hepatocellular carcinoma.  Biopsy-proven hepatocellular carcinoma in the right hepatic lobe was treated with microwave ablation.  Follow-up MRI shows no evidence of local recurrence at the ablation site.  The patient has at least 2 small indeterminate lesions most compatible with LR-3 lesions.  Both of these LR-3 lesions are less than 1 cm in size.  I explained the  MRI findings to the patient.  Recommend continued close surveillance of these indeterminate liver lesions.  Plan for follow-up MRI, with and without contrast, in 4 months and return to clinic after MRI.  Patient is agreeable to this plan.    Electronically Signed: Burman Riis 06/07/2022, 9:06 AM   I spent a total of    10 Minutes in remote  clinical consultation, greater than 50% of which was counseling/coordinating care for hepatocellular carcinoma.    Visit type: Audio only (telephone). Audio (no video) only due to technical limitations. Alternative for in-person consultation at [facility], [address] [place], [place], [place]. This visit type was conducted due to national recommendations for restrictions regarding the COVID-19 Pandemic (e.g. social distancing).  This format is felt to be most appropriate for this patient at this time.  All issues noted in this document were discussed and addressed.   Patient ID: Raven Lee, female   DOB: 1951-01-06, 71 y.o.   MRN: [id number]

## 2022-07-13 ENCOUNTER — Other Ambulatory Visit (HOSPITAL_COMMUNITY): Payer: Self-pay | Admitting: Family Medicine

## 2022-07-13 DIAGNOSIS — Z8505 Personal history of malignant neoplasm of liver: Secondary | ICD-10-CM | POA: Diagnosis not present

## 2022-07-13 DIAGNOSIS — Z0000 Encounter for general adult medical examination without abnormal findings: Secondary | ICD-10-CM | POA: Diagnosis not present

## 2022-07-13 DIAGNOSIS — D696 Thrombocytopenia, unspecified: Secondary | ICD-10-CM | POA: Diagnosis not present

## 2022-07-13 DIAGNOSIS — B182 Chronic viral hepatitis C: Secondary | ICD-10-CM | POA: Diagnosis not present

## 2022-07-13 DIAGNOSIS — J00 Acute nasopharyngitis [common cold]: Secondary | ICD-10-CM | POA: Diagnosis not present

## 2022-07-13 DIAGNOSIS — L900 Lichen sclerosus et atrophicus: Secondary | ICD-10-CM | POA: Diagnosis not present

## 2022-07-13 DIAGNOSIS — Z1231 Encounter for screening mammogram for malignant neoplasm of breast: Secondary | ICD-10-CM

## 2022-07-13 DIAGNOSIS — F5104 Psychophysiologic insomnia: Secondary | ICD-10-CM | POA: Diagnosis not present

## 2022-07-13 DIAGNOSIS — F329 Major depressive disorder, single episode, unspecified: Secondary | ICD-10-CM | POA: Diagnosis not present

## 2022-07-13 DIAGNOSIS — R69 Illness, unspecified: Secondary | ICD-10-CM | POA: Diagnosis not present

## 2022-07-13 DIAGNOSIS — I700 Atherosclerosis of aorta: Secondary | ICD-10-CM | POA: Diagnosis not present

## 2022-07-13 DIAGNOSIS — Z9884 Bariatric surgery status: Secondary | ICD-10-CM | POA: Diagnosis not present

## 2022-07-13 DIAGNOSIS — R7301 Impaired fasting glucose: Secondary | ICD-10-CM | POA: Diagnosis not present

## 2022-07-13 DIAGNOSIS — I10 Essential (primary) hypertension: Secondary | ICD-10-CM | POA: Diagnosis not present

## 2022-08-05 DIAGNOSIS — H539 Unspecified visual disturbance: Secondary | ICD-10-CM | POA: Diagnosis not present

## 2022-08-05 DIAGNOSIS — I10 Essential (primary) hypertension: Secondary | ICD-10-CM | POA: Diagnosis not present

## 2022-08-12 DIAGNOSIS — H353221 Exudative age-related macular degeneration, left eye, with active choroidal neovascularization: Secondary | ICD-10-CM | POA: Diagnosis not present

## 2022-08-12 DIAGNOSIS — H3562 Retinal hemorrhage, left eye: Secondary | ICD-10-CM | POA: Diagnosis not present

## 2022-08-12 DIAGNOSIS — H353112 Nonexudative age-related macular degeneration, right eye, intermediate dry stage: Secondary | ICD-10-CM | POA: Diagnosis not present

## 2022-09-09 DIAGNOSIS — H353221 Exudative age-related macular degeneration, left eye, with active choroidal neovascularization: Secondary | ICD-10-CM | POA: Diagnosis not present

## 2022-09-19 DIAGNOSIS — H2512 Age-related nuclear cataract, left eye: Secondary | ICD-10-CM | POA: Diagnosis not present

## 2022-09-19 DIAGNOSIS — H2511 Age-related nuclear cataract, right eye: Secondary | ICD-10-CM | POA: Diagnosis not present

## 2022-09-21 DIAGNOSIS — I10 Essential (primary) hypertension: Secondary | ICD-10-CM | POA: Diagnosis not present

## 2022-09-26 DIAGNOSIS — I10 Essential (primary) hypertension: Secondary | ICD-10-CM | POA: Diagnosis not present

## 2022-09-26 DIAGNOSIS — Z79899 Other long term (current) drug therapy: Secondary | ICD-10-CM | POA: Diagnosis not present

## 2022-09-26 DIAGNOSIS — Z87891 Personal history of nicotine dependence: Secondary | ICD-10-CM | POA: Diagnosis not present

## 2022-10-10 DIAGNOSIS — I10 Essential (primary) hypertension: Secondary | ICD-10-CM | POA: Diagnosis not present

## 2022-10-19 ENCOUNTER — Inpatient Hospital Stay: Payer: Medicare HMO

## 2022-10-19 DIAGNOSIS — H353221 Exudative age-related macular degeneration, left eye, with active choroidal neovascularization: Secondary | ICD-10-CM | POA: Diagnosis not present

## 2022-10-20 ENCOUNTER — Inpatient Hospital Stay: Payer: Medicare HMO

## 2022-10-21 ENCOUNTER — Other Ambulatory Visit: Payer: Self-pay | Admitting: Diagnostic Radiology

## 2022-10-21 DIAGNOSIS — C220 Liver cell carcinoma: Secondary | ICD-10-CM

## 2022-10-24 ENCOUNTER — Inpatient Hospital Stay: Payer: Medicare HMO | Attending: Hematology

## 2022-10-24 DIAGNOSIS — Z87891 Personal history of nicotine dependence: Secondary | ICD-10-CM | POA: Diagnosis not present

## 2022-10-24 DIAGNOSIS — Z8505 Personal history of malignant neoplasm of liver: Secondary | ICD-10-CM | POA: Insufficient documentation

## 2022-10-24 DIAGNOSIS — D696 Thrombocytopenia, unspecified: Secondary | ICD-10-CM | POA: Diagnosis not present

## 2022-10-24 DIAGNOSIS — D72819 Decreased white blood cell count, unspecified: Secondary | ICD-10-CM | POA: Diagnosis not present

## 2022-10-24 DIAGNOSIS — Z79899 Other long term (current) drug therapy: Secondary | ICD-10-CM | POA: Diagnosis not present

## 2022-10-24 DIAGNOSIS — I10 Essential (primary) hypertension: Secondary | ICD-10-CM | POA: Diagnosis not present

## 2022-10-24 LAB — COMPREHENSIVE METABOLIC PANEL
ALT: 21 U/L (ref 0–44)
AST: 50 U/L — ABNORMAL HIGH (ref 15–41)
Albumin: 3.4 g/dL — ABNORMAL LOW (ref 3.5–5.0)
Alkaline Phosphatase: 95 U/L (ref 38–126)
Anion gap: 11 (ref 5–15)
BUN: 12 mg/dL (ref 8–23)
CO2: 28 mmol/L (ref 22–32)
Calcium: 8.6 mg/dL — ABNORMAL LOW (ref 8.9–10.3)
Chloride: 102 mmol/L (ref 98–111)
Creatinine, Ser: 0.74 mg/dL (ref 0.44–1.00)
GFR, Estimated: >60 mL/min (ref >60)
Glucose, Bld: 107 mg/dL — ABNORMAL HIGH (ref 70–99)
Potassium: 3.7 mmol/L (ref 3.5–5.1)
Sodium: 141 mmol/L (ref 135–145)
Total Bilirubin: 1.4 mg/dL — ABNORMAL HIGH (ref 0.3–1.2)
Total Protein: 7.9 g/dL (ref 6.5–8.1)

## 2022-10-24 LAB — CBC WITH DIFFERENTIAL/PLATELET
Abs Immature Granulocytes: 0.01 10*3/uL (ref 0.00–0.07)
Basophils Absolute: 0 10*3/uL (ref 0.0–0.1)
Basophils Relative: 1 %
Eosinophils Absolute: 0 10*3/uL (ref 0.0–0.5)
Eosinophils Relative: 0 %
HCT: 43 % (ref 36.0–46.0)
Hemoglobin: 14.5 g/dL (ref 12.0–15.0)
Immature Granulocytes: 1 %
Lymphocytes Relative: 31 %
Lymphs Abs: 0.7 10*3/uL (ref 0.7–4.0)
MCH: 33.3 pg (ref 26.0–34.0)
MCHC: 33.7 g/dL (ref 30.0–36.0)
MCV: 98.6 fL (ref 80.0–100.0)
Monocytes Absolute: 0.4 10*3/uL (ref 0.1–1.0)
Monocytes Relative: 16 %
Neutro Abs: 1.1 10*3/uL — ABNORMAL LOW (ref 1.7–7.7)
Neutrophils Relative %: 51 %
Platelets: 95 10*3/uL — ABNORMAL LOW (ref 150–400)
RBC: 4.36 MIL/uL (ref 3.87–5.11)
RDW: 14.4 % (ref 11.5–15.5)
WBC: 2.2 10*3/uL — ABNORMAL LOW (ref 4.0–10.5)
nRBC: 0 % (ref 0.0–0.2)

## 2022-10-25 LAB — AFP TUMOR MARKER: AFP, Serum, Tumor Marker: 6 ng/mL (ref 0.0–9.2)

## 2022-10-26 ENCOUNTER — Inpatient Hospital Stay: Payer: Medicare HMO | Admitting: Hematology

## 2022-10-26 VITALS — BP 201/74 | HR 72 | Temp 98.1°F | Resp 18 | Ht 61.5 in | Wt 169.3 lb

## 2022-10-26 DIAGNOSIS — I10 Essential (primary) hypertension: Secondary | ICD-10-CM | POA: Diagnosis not present

## 2022-10-26 DIAGNOSIS — K769 Liver disease, unspecified: Secondary | ICD-10-CM | POA: Diagnosis not present

## 2022-10-26 DIAGNOSIS — D696 Thrombocytopenia, unspecified: Secondary | ICD-10-CM

## 2022-10-26 DIAGNOSIS — Z79899 Other long term (current) drug therapy: Secondary | ICD-10-CM | POA: Diagnosis not present

## 2022-10-26 DIAGNOSIS — D72819 Decreased white blood cell count, unspecified: Secondary | ICD-10-CM

## 2022-10-26 DIAGNOSIS — Z8505 Personal history of malignant neoplasm of liver: Secondary | ICD-10-CM | POA: Diagnosis not present

## 2022-10-26 DIAGNOSIS — Z87891 Personal history of nicotine dependence: Secondary | ICD-10-CM | POA: Diagnosis not present

## 2022-10-26 NOTE — Patient Instructions (Addendum)
[facility]  Discharge Instructions  You were seen and examined today by Dr. Delton Coombes.  Dr. Delton Coombes discussed your most recent lab work which revealed that everything looks good and stable.  Continue to follow up with primary care about your blood pressure.   Follow-up as scheduled in 6 months with labs prior.    Thank you for choosing [facility] to provide your oncology and hematology care.   To afford each patient quality time with our provider, please arrive at least 15 minutes before your scheduled appointment time. You may need to reschedule your appointment if you arrive late (10 or more minutes). Arriving late affects you and other patients whose appointments are after yours.  Also, if you miss three or more appointments without notifying the office, you may be dismissed from the clinic at the provider's discretion.    Again, thank you for choosing [facility].  Our hope is that these requests will decrease the amount of time that you wait before being seen by our physicians.   If you have a lab appointment with the [facility] please come in thru the Main Entrance and check in at the main information desk.           _____________________________________________________________  Should you have questions after your visit to [facility], please contact our office at [phone] and follow the prompts.  Our office hours are 8:00 a.m. to 4:30 p.m. Monday - Thursday and 8:00 a.m. to 2:30 p.m. Friday.  Please note that voicemails left after 4:00 p.m. may not be returned until the following business day.  We are closed weekends and all major holidays.  You do have access to a nurse 24-7, just call the main number to the clinic [phone] and do not press any options, hold on the line and a nurse will answer the phone.    For prescription refill requests, have your pharmacy contact our office and  allow 72 hours.    Masks are optional in the cancer centers. If you would like for your care team to wear a mask while they are taking care of you, please let them know. You may have one support person who is at least 71 years old accompany you for your appointments.

## 2022-10-26 NOTE — Progress Notes (Signed)
[facility] [address], [place] [address]    Clinic Day:  10/26/2022  Referring physician: Celene Squibb, MD  Patient Care Team: Celene Squibb, MD as PCP - General (Internal Medicine) Gala Romney Cristopher Estimable, MD as Consulting Physician (Gastroenterology)   ASSESSMENT & PLAN:   Assessment: 1.  Leukopenia and thrombocytopenia: - Patient seen at the request of Dr. Juel Burrow office. - CBC on 02/15/2021 with white count 2.4, ANC 1.3, platelet count 84.  Hemoglobin was normal at 15.1.  There was question of aggregation of platelets. - She has mildly decreased platelet count since 2016. - White count is decreased since 2018. - No B symptoms or recurrent infections. 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 - MRI of the abdomen on 09/12/2019 with hepatic steatosis, cirrhosis with stigmata of portal venous hypertension including borderline splenomegaly and varices.  2.  Social/family history: - She lives at home with her husband. - She smoked 1 and half pack per day for 40 years and quit in 2012. - She is currently working at [employer] daily.  She worked for a [employer] prior to that.  No history of chemical exposure. - No family history of low platelets.  Mother died of lung cancer.  Maternal aunt died of cancer.   3.  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 - Evaluated by transplant team on 10/11/2021 not a candidate for transplant. - Microwave ablation on 11/03/2021 by Dr. Anselm Pancoast.  Plan: 1.  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  Spleen size was normal on previous imaging.  Previous nutritional deficiency workup was negative. - Reviewed labs from 10/24/2022.  Platelet count is stable at 95, WBC at 2.2.  ANC is 1.1.  No recurrent infections.  Physical exam today did not reveal any palpable adenopathy or splenomegaly. - Recommend follow-up in 6 months with repeat labs.  2.  Well to moderately  differentiated HCC: - AFP is normal at 6.0.  Last MRI was on 05/31/2022 which showed stable lesions. - She has an MRI scheduled next month and follow-up with Dr. Anselm Pancoast.  Orders Placed This Encounter  Procedures   CBC with Differential/Platelet    Standing Status:   Future    Standing Expiration Date:   10/27/2023    Order Specific Question:   Release to patient    Answer:   Immediate   Vitamin B12    Standing Status:   Future    Standing Expiration Date:   10/27/2023    Order Specific Question:   Release to patient    Answer:   Immediate   Folate    Standing Status:   Future    Standing Expiration Date:   10/27/2023    Order Specific Question:   Release to patient    Answer:   Immediate   Comprehensive metabolic panel    Standing Status:   Future    Standing Expiration Date:   10/27/2023    Order Specific Question:   Release to patient    Answer:   Immediate   AFP tumor marker    Standing Status:   Future    Standing Expiration Date:   10/26/2023      I,Alexis Herring,acting as a scribe for Derek Jack, MD.,have documented all relevant documentation on the behalf of Derek Jack, MD,as directed by  Derek Jack, MD while in the presence of Derek Jack, MD.   I, Derek Jack  MD, have reviewed the above documentation for accuracy and completeness, and I agree with the above.   Derek Jack, MD   2/28/20246:53 PM  CHIEF COMPLAINT:   Diagnosis: leukopenia and thrombocytopenia     Cancer Staging  No matching staging information was found for the patient.   Prior Therapy: none  Current Therapy:  surveillance    HISTORY OF PRESENT ILLNESS:   Oncology History   No history exists.     INTERVAL HISTORY:   Raven Lee is a 72 y.o. female presenting to clinic today for follow up of liver cancer, leukopenia, and thrombocytopenia. She was last seen by me on 04/18/22.  Today, she states that she is doing well overall. Her appetite level  is at 100%. Her energy level is at 75%. She denies any recent infections or issues with bleeding or bruising. She denies any fevers, chills, or night sweats. She denies any abdominal pain or bowel changes.  She states that her blood pressure at home runs around 145/70s. She was started on Hydralazine '25mg'$  BID by her PCP on 10/10/22. She continues to take HCTZ '25mg'$  daily, Quinapril '40mg'$  daily, and Lopressor '50mg'$  daily.  PAST MEDICAL HISTORY:   Past Medical History: Past Medical History:  Diagnosis Date   Anxiety    Difficulty sleeping    Emphysema/COPD ([place])    Hepatitis    HEPATITIS C   History of panic attacks    Hypertension    Liver lesion    Pneumonia    Psoriasis     Surgical History: Past Surgical History:  Procedure Laterality Date   ABDOMINAL HYSTERECTOMY     COLONOSCOPY  2009   [facility]: diverticulosis   IR RADIOLOGIST EVAL & MGMT  09/01/2021   IR RADIOLOGIST EVAL & MGMT  02/08/2022   IR RADIOLOGIST EVAL & MGMT  06/07/2022   LAPAROSCOPIC GASTRIC SLEEVE RESECTION N/A 12/01/2014   Procedure: LAPAROSCOPIC GASTRIC SLEEVE RESECTION UPPER ENDO;  Surgeon: Excell Seltzer, MD;  Location: [facility];  Service: General;  Laterality: N/A;   LUNG LOBECTOMY  03/2011   LLL-severe PNA   RADIOFREQUENCY ABLATION N/A 10/13/2021   Procedure: CT MICROWAVE ABLATION;  Surgeon: Markus Daft, MD;  Location: [facility];  Service: Anesthesiology;  Laterality: N/A;   RADIOFREQUENCY ABLATION N/A 11/03/2021   Procedure: CT MICROWAVE ABLATION;  Surgeon: Markus Daft, MD;  Location: [facility];  Service: Anesthesiology;  Laterality: N/A;   WISDOM TOOTH EXTRACTION      Social History: Social History   Socioeconomic History   Marital status: Married    Spouse name: Jacqui Irias   Number of children: Not on file   Years of education: 12   Highest education level: 12th grade  Occupational History   Occupation: Retired    Fish farm manager: [employer]  Tobacco Use   Smoking status: Former    Packs/day: 1.00    Years:  25.00    Total pack years: 25.00    Types: Cigarettes    Quit date: 03/30/2011    Years since quitting: 11.5    Passive exposure: Past   Smokeless tobacco: Never  Vaping Use   Vaping Use: Never used  Substance and Sexual Activity   Alcohol use: Yes    Alcohol/week: 0.0 standard drinks of alcohol    Comment: socially   Drug use: No   Sexual activity: Not Currently    Partners: Male    Birth control/protection: Post-menopausal  Other Topics Concern   Not on file  Social History Narrative   Not  on file   Social Determinants of Health   Financial Resource Strain: Low Risk  (04/21/2022)   Overall Financial Resource Strain (CARDIA)    Difficulty of Paying Living Expenses: Not hard at all  Food Insecurity: No Food Insecurity (04/21/2022)   Hunger Vital Sign    Worried About Running Out of Food in the Last Year: Never true    Ran Out of Food in the Last Year: Never true  Transportation Needs: No Transportation Needs (04/21/2022)   PRAPARE - Hydrologist (Medical): No    Lack of Transportation (Non-Medical): No  Physical Activity: Inactive (04/21/2022)   Exercise Vital Sign    Days of Exercise per Week: 0 days    Minutes of Exercise per Session: 0 min  Stress: No Stress Concern Present (04/21/2022)   [place]    Feeling of Stress : Only a little  Social Connections: Moderately Integrated (04/21/2022)   Social Connection and Isolation Panel [NHANES]    Frequency of Communication with Friends and Family: More than three times a week    Frequency of Social Gatherings with Friends and Family: More than three times a week    Attends Religious Services: Never    Marine scientist or Organizations: Yes    Attends Music therapist: More than 4 times per year    Marital Status: Married  Human resources officer Violence: Not At Risk (04/21/2022)   Humiliation, Afraid, Rape, and Kick  questionnaire    Fear of Current or Ex-Partner: No    Emotionally Abused: No    Physically Abused: No    Sexually Abused: No    Family History: Family History  Problem Relation Age of Onset   Lung cancer Mother        smoked   Heart disease Father    Heart attack Brother    Diabetes Brother    Stroke Brother    Obesity Other    Colon cancer Neg Hx    Liver disease Neg Hx     Current Medications:  Current Outpatient Medications:    hydrochlorothiazide (HYDRODIURIL) 25 MG tablet, Take 25 mg by mouth every evening. , Disp: , Rfl:    HYDROcodone-acetaminophen (NORCO) 5-325 MG tablet, Take 1 tablet by mouth every 4 (four) hours as needed for up to 10 doses for moderate pain or severe pain., Disp: 10 tablet, Rfl: 0   metoprolol (LOPRESSOR) 50 MG tablet, Take 50 mg by mouth daily., Disp: , Rfl:    mirtazapine (REMERON) 45 MG tablet, Take 45 mg by mouth at bedtime., Disp: , Rfl:    nicotine polacrilex (COMMIT) 2 MG lozenge, Take 2 mg by mouth as needed (stress)., Disp: , Rfl:    nystatin-triamcinolone ointment (MYCOLOG), Apply 1 application topically 2 (two) times daily., Disp: 30 g, Rfl: 11   omeprazole (PRILOSEC) 20 MG capsule, Take 20 mg by mouth daily., Disp: , Rfl:    quinapril (ACCUPRIL) 40 MG tablet, Take 40 mg by mouth every evening. , Disp: , Rfl:    venlafaxine XR (EFFEXOR-XR) 150 MG 24 hr capsule, Take 150 mg by mouth every evening. , Disp: , Rfl:    VRAYLAR 1.5 MG capsule, Take 1.5 mg by mouth daily., Disp: , Rfl:    Allergies: Allergies  Allergen Reactions   Oxycodone Other (See Comments)    Hallucinations    REVIEW OF SYSTEMS:   Review of Systems  Constitutional:  Negative for  chills, fatigue and fever.  HENT:   Negative for lump/mass, mouth sores, nosebleeds, sore throat and trouble swallowing.   Eyes:  Negative for eye problems.  Respiratory:  Negative for cough and shortness of breath.   Cardiovascular:  Negative for chest pain, leg swelling and palpitations.   Gastrointestinal:  Negative for abdominal pain, constipation, diarrhea, nausea and vomiting.  Genitourinary:  Negative for bladder incontinence, difficulty urinating, dysuria, frequency, hematuria and nocturia.   Musculoskeletal:  Negative for arthralgias, back pain, flank pain, myalgias and neck pain.  Skin:  Negative for itching and rash.  Neurological:  Negative for dizziness, headaches and numbness.  Hematological:  Does not bruise/bleed easily.  Psychiatric/Behavioral:  Negative for depression, sleep disturbance and suicidal ideas. The patient is not nervous/anxious.   All other systems reviewed and are negative.    VITALS:   Blood pressure (!) 201/74, pulse 72, temperature 98.1 F (36.7 C), temperature source Oral, resp. rate 18, height 5' 1.5" (1.562 m), weight 169 lb 4.8 oz (76.8 kg), SpO2 93 %.  Wt Readings from Last 3 Encounters:  10/26/22 169 lb 4.8 oz (76.8 kg)  04/18/22 159 lb 11.2 oz (72.4 kg)  12/13/21 156 lb 3.2 oz (70.9 kg)    Body mass index is 31.47 kg/m.  Performance status (ECOG): 1 - Symptomatic but completely ambulatory  PHYSICAL EXAM:   Physical Exam Vitals and nursing note reviewed. Exam conducted with a chaperone present.  Constitutional:      Appearance: Normal appearance.  Cardiovascular:     Rate and Rhythm: Normal rate and regular rhythm.     Pulses: Normal pulses.     Heart sounds: Normal heart sounds.  Pulmonary:     Effort: Pulmonary effort is normal.     Breath sounds: Normal breath sounds.  Abdominal:     Palpations: Abdomen is soft. There is no hepatomegaly, splenomegaly or mass.     Tenderness: There is no abdominal tenderness.  Musculoskeletal:     Right lower leg: No edema.     Left lower leg: No edema.  Lymphadenopathy:     Cervical: No cervical adenopathy.     Right cervical: No superficial, deep or posterior cervical adenopathy.    Left cervical: No superficial, deep or posterior cervical adenopathy.     Upper Body:     Right  upper body: No supraclavicular or axillary adenopathy.     Left upper body: No supraclavicular or axillary adenopathy.  Neurological:     General: No focal deficit present.     Mental Status: She is alert and oriented to person, place, and time.  Psychiatric:        Mood and Affect: Mood normal.        Behavior: Behavior normal.     LABS:      Latest Ref Rng & Units 10/24/2022   11:06 AM 03/23/2022   12:24 PM 11/16/2021    1:07 PM  CBC  WBC 4.0 - 10.5 K/uL 2.2  2.8  2.8   Hemoglobin 12.0 - 15.0 g/dL 14.5  13.3  14.7   Hematocrit 36.0 - 46.0 % 43.0  39.8  43.1   Platelets 150 - 400 K/uL 95  102  85       Latest Ref Rng & Units 10/24/2022   11:06 AM 03/23/2022   12:24 PM 10/25/2021    1:38 PM  CMP  Glucose 70 - 99 mg/dL 107  103  106   BUN 8 - 23 mg/dL 12  11  10   Creatinine 0.44 - 1.00 mg/dL 0.74  0.81  0.81   Sodium 135 - 145 mmol/L 141  142  139   Potassium 3.5 - 5.1 mmol/L 3.7  3.6  3.9   Chloride 98 - 111 mmol/L 102  108  105   CO2 22 - 32 mmol/L '28  28  29   '$ Calcium 8.9 - 10.3 mg/dL 8.6  8.7  9.0   Total Protein 6.5 - 8.1 g/dL 7.9  7.4  8.4   Total Bilirubin 0.3 - 1.2 mg/dL 1.4  1.5  1.7   Alkaline Phos 38 - 126 U/L 95  100  119   AST 15 - 41 U/L 50  50  49   ALT 0 - 44 U/L '21  24  22    '$ Component     Latest Ref Rng 11/16/2021 03/23/2022 10/24/2022  AFP, Serum, Tumor Marker     0.0 - 9.2 ng/mL 9.3 (H)  6.8  6.0      No results found for: "CEA1", "CEA" / No results found for: "CEA1", "CEA" No results found for: "PSA1" No results found for: "[id number]" No results found for: "CAN125"  No results found for: "TOTALPROTELP", "ALBUMINELP", "A1GS", "A2GS", "BETS", "BETA2SER", "GAMS", "MSPIKE", "SPEI" Lab Results  Component Value Date   TIBC 306 11/13/2017   TIBC 326 05/11/2017   FERRITIN 131 11/13/2017   FERRITIN 112 05/11/2017   IRONPCTSAT 73 (H) 11/13/2017   IRONPCTSAT 47 05/11/2017   Lab Results  Component Value Date   LDH 247 (H) 03/23/2022   LDH 256 (H)  05/28/2021     STUDIES:   No results found.

## 2022-11-04 DIAGNOSIS — L900 Lichen sclerosus et atrophicus: Secondary | ICD-10-CM | POA: Diagnosis not present

## 2022-11-04 DIAGNOSIS — L299 Pruritus, unspecified: Secondary | ICD-10-CM | POA: Diagnosis not present

## 2022-11-16 ENCOUNTER — Ambulatory Visit (HOSPITAL_COMMUNITY)
Admission: RE | Admit: 2022-11-16 | Discharge: 2022-11-16 | Disposition: A | Discharge status: Home / Self Care | Payer: Medicare HMO | Source: Ambulatory Visit | Attending: Diagnostic Radiology | Admitting: Diagnostic Radiology

## 2022-11-16 DIAGNOSIS — C220 Liver cell carcinoma: Secondary | ICD-10-CM | POA: Diagnosis not present

## 2022-11-16 DIAGNOSIS — K7460 Unspecified cirrhosis of liver: Secondary | ICD-10-CM | POA: Diagnosis not present

## 2022-11-16 DIAGNOSIS — H353221 Exudative age-related macular degeneration, left eye, with active choroidal neovascularization: Secondary | ICD-10-CM | POA: Diagnosis not present

## 2022-11-16 MED ORDER — GADOBUTROL 1 MMOL/ML IV SOLN
7.4000 mL | Freq: Once | INTRAVENOUS | Status: AC | PRN
  Administered 2022-11-16: 7.4 mL via INTRAVENOUS

## 2022-11-30 DIAGNOSIS — H269 Unspecified cataract: Secondary | ICD-10-CM | POA: Diagnosis not present

## 2022-11-30 DIAGNOSIS — H2512 Age-related nuclear cataract, left eye: Secondary | ICD-10-CM | POA: Diagnosis not present

## 2022-12-05 ENCOUNTER — Other Ambulatory Visit: Payer: Medicare HMO

## 2022-12-28 DIAGNOSIS — H353221 Exudative age-related macular degeneration, left eye, with active choroidal neovascularization: Secondary | ICD-10-CM | POA: Diagnosis not present

## 2023-01-18 DIAGNOSIS — H2511 Age-related nuclear cataract, right eye: Secondary | ICD-10-CM | POA: Diagnosis not present

## 2023-01-27 DIAGNOSIS — R7301 Impaired fasting glucose: Secondary | ICD-10-CM | POA: Diagnosis not present

## 2023-01-27 DIAGNOSIS — I10 Essential (primary) hypertension: Secondary | ICD-10-CM | POA: Diagnosis not present

## 2023-01-31 DIAGNOSIS — Z8505 Personal history of malignant neoplasm of liver: Secondary | ICD-10-CM | POA: Diagnosis not present

## 2023-01-31 DIAGNOSIS — Z9884 Bariatric surgery status: Secondary | ICD-10-CM | POA: Diagnosis not present

## 2023-01-31 DIAGNOSIS — B182 Chronic viral hepatitis C: Secondary | ICD-10-CM | POA: Diagnosis not present

## 2023-01-31 DIAGNOSIS — H3530 Unspecified macular degeneration: Secondary | ICD-10-CM | POA: Diagnosis not present

## 2023-01-31 DIAGNOSIS — F5104 Psychophysiologic insomnia: Secondary | ICD-10-CM | POA: Diagnosis not present

## 2023-01-31 DIAGNOSIS — F329 Major depressive disorder, single episode, unspecified: Secondary | ICD-10-CM | POA: Diagnosis not present

## 2023-01-31 DIAGNOSIS — L900 Lichen sclerosus et atrophicus: Secondary | ICD-10-CM | POA: Diagnosis not present

## 2023-01-31 DIAGNOSIS — I700 Atherosclerosis of aorta: Secondary | ICD-10-CM | POA: Diagnosis not present

## 2023-01-31 DIAGNOSIS — D72819 Decreased white blood cell count, unspecified: Secondary | ICD-10-CM | POA: Diagnosis not present

## 2023-01-31 DIAGNOSIS — I10 Essential (primary) hypertension: Secondary | ICD-10-CM | POA: Diagnosis not present

## 2023-01-31 DIAGNOSIS — R7301 Impaired fasting glucose: Secondary | ICD-10-CM | POA: Diagnosis not present

## 2023-01-31 DIAGNOSIS — D696 Thrombocytopenia, unspecified: Secondary | ICD-10-CM | POA: Diagnosis not present

## 2023-02-03 DIAGNOSIS — H353221 Exudative age-related macular degeneration, left eye, with active choroidal neovascularization: Secondary | ICD-10-CM | POA: Diagnosis not present

## 2023-03-10 DIAGNOSIS — H353221 Exudative age-related macular degeneration, left eye, with active choroidal neovascularization: Secondary | ICD-10-CM | POA: Diagnosis not present

## 2023-03-20 DIAGNOSIS — Z7952 Long term (current) use of systemic steroids: Secondary | ICD-10-CM | POA: Diagnosis not present

## 2023-03-20 DIAGNOSIS — L400 Psoriasis vulgaris: Secondary | ICD-10-CM | POA: Diagnosis not present

## 2023-03-20 DIAGNOSIS — R2230 Localized swelling, mass and lump, unspecified upper limb: Secondary | ICD-10-CM | POA: Diagnosis not present

## 2023-03-20 DIAGNOSIS — R2233 Localized swelling, mass and lump, upper limb, bilateral: Secondary | ICD-10-CM | POA: Diagnosis not present

## 2023-04-12 DIAGNOSIS — H353221 Exudative age-related macular degeneration, left eye, with active choroidal neovascularization: Secondary | ICD-10-CM | POA: Diagnosis not present

## 2023-04-26 ENCOUNTER — Inpatient Hospital Stay: Payer: Medicare HMO

## 2023-04-27 ENCOUNTER — Inpatient Hospital Stay: Payer: Medicare HMO | Attending: Hematology

## 2023-04-27 DIAGNOSIS — R932 Abnormal findings on diagnostic imaging of liver and biliary tract: Secondary | ICD-10-CM | POA: Diagnosis not present

## 2023-04-27 DIAGNOSIS — D72819 Decreased white blood cell count, unspecified: Secondary | ICD-10-CM | POA: Insufficient documentation

## 2023-04-27 DIAGNOSIS — D696 Thrombocytopenia, unspecified: Secondary | ICD-10-CM | POA: Insufficient documentation

## 2023-04-27 DIAGNOSIS — K769 Liver disease, unspecified: Secondary | ICD-10-CM | POA: Diagnosis not present

## 2023-04-27 LAB — CBC WITH DIFFERENTIAL/PLATELET
Abs Immature Granulocytes: 0.01 10*3/uL (ref 0.00–0.07)
Basophils Absolute: 0 10*3/uL (ref 0.0–0.1)
Basophils Relative: 1 %
Eosinophils Absolute: 0 10*3/uL (ref 0.0–0.5)
Eosinophils Relative: 0 %
HCT: 41.1 % (ref 36.0–46.0)
Hemoglobin: 13.8 g/dL (ref 12.0–15.0)
Immature Granulocytes: 0 %
Lymphocytes Relative: 30 %
Lymphs Abs: 0.7 10*3/uL (ref 0.7–4.0)
MCH: 32.2 pg (ref 26.0–34.0)
MCHC: 33.6 g/dL (ref 30.0–36.0)
MCV: 96 fL (ref 80.0–100.0)
Monocytes Absolute: 0.4 10*3/uL (ref 0.1–1.0)
Monocytes Relative: 15 %
Neutro Abs: 1.3 10*3/uL — ABNORMAL LOW (ref 1.7–7.7)
Neutrophils Relative %: 54 %
Platelets: 101 10*3/uL — ABNORMAL LOW (ref 150–400)
RBC: 4.28 MIL/uL (ref 3.87–5.11)
RDW: 14 % (ref 11.5–15.5)
WBC: 2.5 10*3/uL — ABNORMAL LOW (ref 4.0–10.5)
nRBC: 0 % (ref 0.0–0.2)

## 2023-04-27 LAB — COMPREHENSIVE METABOLIC PANEL
ALT: 21 U/L (ref 0–44)
AST: 47 U/L — ABNORMAL HIGH (ref 15–41)
Albumin: 3.4 g/dL — ABNORMAL LOW (ref 3.5–5.0)
Alkaline Phosphatase: 86 U/L (ref 38–126)
Anion gap: 8 (ref 5–15)
BUN: 14 mg/dL (ref 8–23)
CO2: 28 mmol/L (ref 22–32)
Calcium: 8.9 mg/dL (ref 8.9–10.3)
Chloride: 102 mmol/L (ref 98–111)
Creatinine, Ser: 0.81 mg/dL (ref 0.44–1.00)
GFR, Estimated: >60 mL/min (ref >60)
Glucose, Bld: 144 mg/dL — ABNORMAL HIGH (ref 70–99)
Potassium: 3.3 mmol/L — ABNORMAL LOW (ref 3.5–5.1)
Sodium: 138 mmol/L (ref 135–145)
Total Bilirubin: 1.5 mg/dL — ABNORMAL HIGH (ref 0.3–1.2)
Total Protein: 7.5 g/dL (ref 6.5–8.1)

## 2023-04-27 LAB — VITAMIN B12: Vitamin B-12: 527 pg/mL (ref 180–914)

## 2023-04-27 LAB — FOLATE: Folate: 10.9 ng/mL (ref >5.9)

## 2023-04-28 LAB — AFP TUMOR MARKER: AFP, Serum, Tumor Marker: 5.3 ng/mL (ref 0.0–9.2)

## 2023-05-03 ENCOUNTER — Inpatient Hospital Stay: Payer: Medicare HMO | Attending: Hematology | Admitting: Hematology

## 2023-05-04 DIAGNOSIS — G4700 Insomnia, unspecified: Secondary | ICD-10-CM | POA: Diagnosis not present

## 2023-05-04 DIAGNOSIS — Z79899 Other long term (current) drug therapy: Secondary | ICD-10-CM | POA: Diagnosis not present

## 2023-05-17 DIAGNOSIS — H353221 Exudative age-related macular degeneration, left eye, with active choroidal neovascularization: Secondary | ICD-10-CM | POA: Diagnosis not present

## 2023-06-23 DIAGNOSIS — H353211 Exudative age-related macular degeneration, right eye, with active choroidal neovascularization: Secondary | ICD-10-CM | POA: Diagnosis not present

## 2023-06-27 ENCOUNTER — Inpatient Hospital Stay: Payer: Medicare HMO | Admitting: Hematology

## 2023-06-27 NOTE — Progress Notes (Incomplete)
[facility] [address] [place] [address]    Clinic Day:  06/27/2023  Referring physician: Benita Stabile, MD  Patient Care Team: Benita Stabile, MD as PCP - General (Internal Medicine) Jena Gauss Gerrit Friends, MD as Consulting Physician (Gastroenterology)   ASSESSMENT & PLAN:   Assessment: 1.  Leukopenia and thrombocytopenia: - Patient seen at the request of Dr. Scharlene Gloss office. - CBC on 02/15/2021 with white count 2.4, ANC 1.3, platelet count 84.  Hemoglobin was normal at 15.1.  There was question of aggregation of platelets. - She has mildly decreased platelet count since 2016. - White count is decreased since 2018. - No B symptoms or recurrent infections. 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 - MRI of the abdomen on 09/12/2019 with hepatic steatosis, cirrhosis with stigmata of portal venous hypertension including borderline splenomegaly and varices.  2.  Social/family history: - She lives at home with her husband. - She smoked 1 and half pack per day for 40 years and quit in 2012. - She is currently working at [employer] daily.  She worked for a [employer] prior to that.  No history of chemical exposure. - No family history of low platelets.  Mother died of lung cancer.  Maternal aunt died of cancer.   3.  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 - Evaluated by transplant team on 10/11/2021 not a candidate for transplant. - Microwave ablation on 11/03/2021 by Dr. Lowella Dandy.  Plan: 1.  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  Spleen size was normal on previous imaging.  Previous nutritional deficiency workup was negative. - Reviewed labs from 10/24/2022.  Platelet count is stable at 95, WBC at 2.2.  ANC is 1.1.  No recurrent infections.  Physical exam today did not reveal any palpable adenopathy or splenomegaly. - Recommend follow-up in 6 months with repeat labs.  2.  Well to moderately  differentiated HCC: - AFP is normal at 6.0.  Last MRI was on 05/31/2022 which showed stable lesions. - She has an MRI scheduled next month and follow-up with Dr. Lowella Dandy.  No orders of the defined types were placed in this encounter.     Alben Deeds Teague,acting as a Neurosurgeon for Doreatha Massed, MD.,have documented all relevant documentation on the behalf of Doreatha Massed, MD,as directed by  Doreatha Massed, MD while in the presence of Doreatha Massed, MD.  ***   Bergland R Teague   10/29/202412:42 PM  CHIEF COMPLAINT:   Diagnosis: leukopenia and thrombocytopenia     Cancer Staging  No matching staging information was found for the patient.    Prior Therapy: none  Current Therapy:  surveillance    HISTORY OF PRESENT ILLNESS:   Oncology History   No history exists.     INTERVAL HISTORY:   Raven Lee is a 72 y.o. female presenting to clinic today for follow up of liver cancer, leukopenia, and thrombocytopenia. She was last seen by me on 10/26/22.  Today, she states that she is doing well overall. Her appetite level is at ***%. Her energy level is at ***%.   PAST MEDICAL HISTORY:   Past Medical History: Past Medical History:  Diagnosis Date   Anxiety    Difficulty sleeping    Emphysema/COPD (HCC)    Hepatitis    HEPATITIS C   History of panic attacks    Hypertension    Liver lesion    Pneumonia  Psoriasis     Surgical History: Past Surgical History:  Procedure Laterality Date   ABDOMINAL HYSTERECTOMY     COLONOSCOPY  2009   [facility]: diverticulosis   IR RADIOLOGIST EVAL & MGMT  09/01/2021   IR RADIOLOGIST EVAL & MGMT  02/08/2022   IR RADIOLOGIST EVAL & MGMT  06/07/2022   LAPAROSCOPIC GASTRIC SLEEVE RESECTION N/A 12/01/2014   Procedure: LAPAROSCOPIC GASTRIC SLEEVE RESECTION UPPER ENDO;  Surgeon: Glenna Fellows, MD;  Location: [facility];  Service: General;  Laterality: N/A;   LUNG LOBECTOMY  03/2011   LLL-severe PNA   RADIOFREQUENCY ABLATION N/A  10/13/2021   Procedure: CT MICROWAVE ABLATION;  Surgeon: Richarda Overlie, MD;  Location: [facility];  Service: Anesthesiology;  Laterality: N/A;   RADIOFREQUENCY ABLATION N/A 11/03/2021   Procedure: CT MICROWAVE ABLATION;  Surgeon: Richarda Overlie, MD;  Location: [facility];  Service: Anesthesiology;  Laterality: N/A;   WISDOM TOOTH EXTRACTION      Social History: Social History   Socioeconomic History   Marital status: Married    Spouse name: Terise Foret   Number of children: Not on file   Years of education: 12   Highest education level: 12th grade  Occupational History   Occupation: Retired    Associate Professor: [employer]  Tobacco Use   Smoking status: Former    Current packs/day: 0.00    Average packs/day: 1 pack/day for 25.0 years (25.0 ttl pk-yrs)    Types: Cigarettes    Start date: 03/29/1986    Quit date: 03/30/2011    Years since quitting: 12.2    Passive exposure: Past   Smokeless tobacco: Never  Vaping Use   Vaping status: Never Used  Substance and Sexual Activity   Alcohol use: Yes    Alcohol/week: 0.0 standard drinks of alcohol    Comment: socially   Drug use: No   Sexual activity: Not Currently    Partners: Male    Birth control/protection: Post-menopausal  Other Topics Concern   Not on file  Social History Narrative   Not on file   Social Determinants of Health   Financial Resource Strain: Low Risk  (04/21/2022)   Overall Financial Resource Strain (CARDIA)    Difficulty of Paying Living Expenses: Not hard at all  Food Insecurity: No Food Insecurity (04/21/2022)   Hunger Vital Sign    Worried About Running Out of Food in the Last Year: Never true    Ran Out of Food in the Last Year: Never true  Transportation Needs: No Transportation Needs (04/21/2022)   PRAPARE - Administrator, Civil Service (Medical): No    Lack of Transportation (Non-Medical): No  Physical Activity: Inactive (04/21/2022)   Exercise Vital Sign    Days of Exercise per Week: 0 days    Minutes of  Exercise per Session: 0 min  Stress: No Stress Concern Present (04/21/2022)   [employer] of Occupational Health - Occupational Stress Questionnaire    Feeling of Stress : Only a little  Social Connections: Moderately Integrated (04/21/2022)   Social Connection and Isolation Panel [NHANES]    Frequency of Communication with Friends and Family: More than three times a week    Frequency of Social Gatherings with Friends and Family: More than three times a week    Attends Religious Services: Never    Database administrator or Organizations: Yes    Attends Engineer, structural: More than 4 times per year    Marital Status: Married  Catering manager  Violence: Not At Risk (04/21/2022)   Humiliation, Afraid, Rape, and Kick questionnaire    Fear of Current or Ex-Partner: No    Emotionally Abused: No    Physically Abused: No    Sexually Abused: No    Family History: Family History  Problem Relation Age of Onset   Lung cancer Mother        smoked   Heart disease Father    Heart attack Brother    Diabetes Brother    Stroke Brother    Obesity Other    Colon cancer Neg Hx    Liver disease Neg Hx     Current Medications:  Current Outpatient Medications:    hydrochlorothiazide (HYDRODIURIL) 25 MG tablet, Take 25 mg by mouth every evening. , Disp: , Rfl:    HYDROcodone-acetaminophen (NORCO) 5-325 MG tablet, Take 1 tablet by mouth every 4 (four) hours as needed for up to 10 doses for moderate pain or severe pain., Disp: 10 tablet, Rfl: 0   metoprolol (LOPRESSOR) 50 MG tablet, Take 50 mg by mouth daily., Disp: , Rfl:    mirtazapine (REMERON) 45 MG tablet, Take 45 mg by mouth at bedtime., Disp: , Rfl:    nicotine polacrilex (COMMIT) 2 MG lozenge, Take 2 mg by mouth as needed (stress)., Disp: , Rfl:    nystatin-triamcinolone ointment (MYCOLOG), Apply 1 application topically 2 (two) times daily., Disp: 30 g, Rfl: 11   omeprazole (PRILOSEC) 20 MG capsule, Take 20 mg by mouth daily.,  Disp: , Rfl:    quinapril (ACCUPRIL) 40 MG tablet, Take 40 mg by mouth every evening. , Disp: , Rfl:    venlafaxine XR (EFFEXOR-XR) 150 MG 24 hr capsule, Take 150 mg by mouth every evening. , Disp: , Rfl:    VRAYLAR 1.5 MG capsule, Take 1.5 mg by mouth daily., Disp: , Rfl:    Allergies: Allergies  Allergen Reactions   Oxycodone Other (See Comments)    Hallucinations    REVIEW OF SYSTEMS:   Review of Systems  Constitutional:  Negative for chills, fatigue and fever.  HENT:   Negative for lump/mass, mouth sores, nosebleeds, sore throat and trouble swallowing.   Eyes:  Negative for eye problems.  Respiratory:  Negative for cough and shortness of breath.   Cardiovascular:  Negative for chest pain, leg swelling and palpitations.  Gastrointestinal:  Negative for abdominal pain, constipation, diarrhea, nausea and vomiting.  Genitourinary:  Negative for bladder incontinence, difficulty urinating, dysuria, frequency, hematuria and nocturia.   Musculoskeletal:  Negative for arthralgias, back pain, flank pain, myalgias and neck pain.  Skin:  Negative for itching and rash.  Neurological:  Negative for dizziness, headaches and numbness.  Hematological:  Does not bruise/bleed easily.  Psychiatric/Behavioral:  Negative for depression, sleep disturbance and suicidal ideas. The patient is not nervous/anxious.   All other systems reviewed and are negative.    VITALS:   There were no vitals taken for this visit.  Wt Readings from Last 3 Encounters:  10/26/22 169 lb 4.8 oz (76.8 kg)  04/18/22 159 lb 11.2 oz (72.4 kg)  12/13/21 156 lb 3.2 oz (70.9 kg)    There is no height or weight on file to calculate BMI.  Performance status (ECOG): 1 - Symptomatic but completely ambulatory  PHYSICAL EXAM:   Physical Exam Vitals and nursing note reviewed. Exam conducted with a chaperone present.  Constitutional:      Appearance: Normal appearance.  Cardiovascular:     Rate and Rhythm: Normal rate and  regular rhythm.     Pulses: Normal pulses.     Heart sounds: Normal heart sounds.  Pulmonary:     Effort: Pulmonary effort is normal.     Breath sounds: Normal breath sounds.  Abdominal:     Palpations: Abdomen is soft. There is no hepatomegaly, splenomegaly or mass.     Tenderness: There is no abdominal tenderness.  Musculoskeletal:     Right lower leg: No edema.     Left lower leg: No edema.  Lymphadenopathy:     Cervical: No cervical adenopathy.     Right cervical: No superficial, deep or posterior cervical adenopathy.    Left cervical: No superficial, deep or posterior cervical adenopathy.     Upper Body:     Right upper body: No supraclavicular or axillary adenopathy.     Left upper body: No supraclavicular or axillary adenopathy.  Neurological:     General: No focal deficit present.     Mental Status: She is alert and oriented to person, place, and time.  Psychiatric:        Mood and Affect: Mood normal.        Behavior: Behavior normal.     LABS:      Latest Ref Rng & Units 04/27/2023    9:53 AM 10/24/2022   11:06 AM 03/23/2022   12:24 PM  CBC  WBC 4.0 - 10.5 K/uL 2.5  2.2  2.8   Hemoglobin 12.0 - 15.0 g/dL 16.1  09.6  04.5   Hematocrit 36.0 - 46.0 % 41.1  43.0  39.8   Platelets 150 - 400 K/uL 101  95  102       Latest Ref Rng & Units 04/27/2023    9:53 AM 10/24/2022   11:06 AM 03/23/2022   12:24 PM  CMP  Glucose 70 - 99 mg/dL 409  811  914   BUN 8 - 23 mg/dL 14  12  11    Creatinine 0.44 - 1.00 mg/dL 7.82  9.56  2.13   Sodium 135 - 145 mmol/L 138  141  142   Potassium 3.5 - 5.1 mmol/L 3.3  3.7  3.6   Chloride 98 - 111 mmol/L 102  102  108   CO2 22 - 32 mmol/L 28  28  28    Calcium 8.9 - 10.3 mg/dL 8.9  8.6  8.7   Total Protein 6.5 - 8.1 g/dL 7.5  7.9  7.4   Total Bilirubin 0.3 - 1.2 mg/dL 1.5  1.4  1.5   Alkaline Phos 38 - 126 U/L 86  95  100   AST 15 - 41 U/L 47  50  50   ALT 0 - 44 U/L 21  21  24     Component     Latest Ref Rng 11/16/2021 03/23/2022  10/24/2022  AFP, Serum, Tumor Marker     0.0 - 9.2 ng/mL 9.3 (H)  6.8  6.0      No results found for: "CEA1", "CEA" / No results found for: "CEA1", "CEA" No results found for: "PSA1" No results found for: "YQM578" No results found for: "CAN125"  No results found for: "TOTALPROTELP", "ALBUMINELP", "A1GS", "A2GS", "BETS", "BETA2SER", "GAMS", "MSPIKE", "SPEI" Lab Results  Component Value Date   TIBC 306 11/13/2017   TIBC 326 05/11/2017   FERRITIN 131 11/13/2017   FERRITIN 112 05/11/2017   IRONPCTSAT 73 (H) 11/13/2017   IRONPCTSAT 47 05/11/2017   Lab Results  Component Value Date   LDH 247 (H) 03/23/2022  LDH 256 (H) 05/28/2021     STUDIES:   No results found.

## 2023-07-02 NOTE — Progress Notes (Unsigned)
[facility] [address], [place] [address]   CLINIC:  Medical Oncology/Hematology  PCP:  Benita Stabile, MD [address] [place] [address] [phone]   REASON FOR VISIT:  Follow-up for leukopenia and thrombocytopenia  CURRENT THERAPY: Surveillance  INTERVAL HISTORY:   Raven Lee 72 y.o. female returns for routine follow-up of her leukopenia and thrombocytopenia.  She continues to follow at [facility] for her [facility] and liver cirrhosis.  She was last seen by Dr. Ellin Saba on 10/26/2022.  At today's visit, she reports feeling fair.  No recent hospitalizations, surgeries, or changes in baseline health status.  She denies any recent infections or issues with bleeding or bruising.  She denies any fevers, chills, or night sweats.  She denies any abdominal pain or bowel changes.  She has 40% energy and 80% appetite. She endorses that she is maintaining a stable weight.  ASSESSMENT & PLAN:  1.   Leukopenia and thrombocytopenia: - Mild to moderate thrombocytopenia since at least 2016.  White count is decreased since 2018. - She was treated for hepatitis C with Epclusa and was reportedly cured. - MRI of the abdomen on 09/12/2019 with hepatic steatosis, cirrhosis with stigmata of portal venous hypertension including borderline splenomegaly and varices.  (Spleen size measured up to 13 cm per MR on 08/13/2021, but most recent MR abdomen from 11/16/2022 notes normal spleen size). - Workup (December 2022) showed normal B12, MMA, copper, folic acid.  She had normal ANA and rheumatoid factor.  Hepatitis B was negative. - No B symptoms or recurrent infections. - Denies any abnormal bruising or bleeding. - Most recent labs (04/27/2023) show platelets 101, WBC 2.5/ANC 1.3.  Blood counts overall at baseline. - No palpable adenopathy or splenomegaly on exam today. - Etiology likely related to liver disease and splenic sequestration.  If any worsening blood counts beyond  baseline, would consider additional workup including bone marrow biopsy. - PLAN: Continue surveillance with repeat CBC in 6 months.  If any worsening blood counts beyond baseline, would consider bone marrow biopsy.  2.  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 - Evaluated by transplant team on 10/11/2021 ([facility]) not a candidate for transplant. - Microwave ablation on 11/03/2021 by Dr. Lowella Dandy [facility] Interventional Radiology) - Most recent MRI abdomen (11/16/2022): Unchanged subcapsular ablation defect of the peripheral right lobe of the liver, hepatic segment VI/VII measuring 2.3 x 1.0 cm without evidence of residual contrast-enhancement.  LI-RADS TR, nonviable. Unchanged subcentimeter arterially hyperenhancing lesions of the superior liver dome, hepatic segment VII measuring 0.9 x 0.7 cm and in superior hepatic segment IVA measuring 0.9 x 0.7 cm.  No evidence of washout or capsular enhancement associated with either of these lesions.  BI-RADS Category 3, intermediate probability for hepatocellular carcinoma, with follow-up MRI recommended in 6 months. Cirrhosis with severe reticular fibrosis throughout.  Previously noted large relatively a fibrotic regenerative nodule of the posterior liver dome. Gastroesophageal varices.  No ascites.  Normal spleen size. - Last AFP (04/27/2023) normal at 5.3. -She is overdue for follow-up with Dr. Lowella Dandy (, Interventional Radiology), last seen in October 2023 - She has not been seen by hepatology/liver transplant at [facility] since May 2023.  Patient reports that she was not happy with treatment team there, and declines to follow-up with them in the future. - PLAN: Patient overdue for IR follow-up - will route chart to Dr. Lowella Dandy so that she can schedule appropriate follow-up  with his office. - Since patient declines further follow-up with hepatology at [facility], we will refer her to local gastroenterologist for management  of cirrhosis - Recommend alternating MD/APP visits at [facility]  3.  Other history: - Other PMH: S/p LLL lung lobectomy in 2012 due to severe pneumonia.  COPD, hypertension, cirrhosis. - She lives at home with her husband. - She smoked 1 and half pack per day for 40 years and quit in 2012. - She is currently working at [employer] daily.  She worked for a [employer] prior to that.  No history of chemical exposure. - No family history of low platelets.  Mother died of lung cancer.  Maternal aunt died of cancer.    PLAN SUMMARY: >> Referral entered for gastroenterology (Dr. Tasia Catchings) due to liver cirrhosis >> Labs in 6 months = CBC/D, CMP, folate, B12, MMA, AFP tumor marker >> MD visit (Dr. Ellin Saba) in 6 months (1 week after labs) - alternate MD/APP visits     REVIEW OF SYSTEMS:   Review of Systems  Constitutional:  Positive for fatigue. Negative for appetite change, chills, diaphoresis, fever and unexpected weight change.  HENT:   Negative for lump/mass and nosebleeds.   Eyes:  Negative for eye problems.  Respiratory:  Negative for cough, hemoptysis and shortness of breath.   Cardiovascular:  Negative for chest pain, leg swelling and palpitations.  Gastrointestinal:  Negative for abdominal pain, blood in stool, constipation, diarrhea, nausea and vomiting.  Genitourinary:  Negative for hematuria.   Musculoskeletal:  Positive for arthralgias.  Skin: Negative.   Neurological:  Negative for dizziness, headaches and light-headedness.  Hematological:  Does not bruise/bleed easily.     PHYSICAL EXAM:  ECOG PERFORMANCE STATUS: 1 - Symptomatic but completely ambulatory  Vitals:   07/03/23 1007  BP: (!) 170/75  Pulse: 89  Resp: 18  Temp: 98.2 F (36.8 C)  SpO2: 99%   Filed Weights   07/03/23 1007  Weight: 160 lb 12.8 oz (72.9 kg)   Physical Exam Constitutional:      Appearance: Normal appearance. She is obese.  Cardiovascular:     Heart sounds: Normal heart sounds.   Pulmonary:     Breath sounds: Decreased air movement present.  Neurological:     General: No focal deficit present.     Mental Status: Mental status is at baseline.  Psychiatric:        Behavior: Behavior normal. Behavior is cooperative.     PAST MEDICAL/SURGICAL HISTORY:  Past Medical History:  Diagnosis Date   Anxiety    Difficulty sleeping    Emphysema/COPD (HCC)    Hepatitis    HEPATITIS C   History of panic attacks    Hypertension    Liver lesion    Pneumonia    Psoriasis    Past Surgical History:  Procedure Laterality Date   ABDOMINAL HYSTERECTOMY     COLONOSCOPY  2009   [facility]: diverticulosis   IR RADIOLOGIST EVAL & MGMT  09/01/2021   IR RADIOLOGIST EVAL & MGMT  02/08/2022   IR RADIOLOGIST EVAL & MGMT  06/07/2022   LAPAROSCOPIC GASTRIC SLEEVE RESECTION N/A 12/01/2014   Procedure: LAPAROSCOPIC GASTRIC SLEEVE RESECTION UPPER ENDO;  Surgeon: Glenna Fellows, MD;  Location: [facility];  Service: General;  Laterality: N/A;   LUNG LOBECTOMY  03/2011   LLL-severe PNA   RADIOFREQUENCY ABLATION N/A 10/13/2021   Procedure: CT MICROWAVE ABLATION;  Surgeon: Richarda Overlie, MD;  Location: [facility];  Service: Anesthesiology;  Laterality:  N/A;   RADIOFREQUENCY ABLATION N/A 11/03/2021   Procedure: CT MICROWAVE ABLATION;  Surgeon: Richarda Overlie, MD;  Location: [facility];  Service: Anesthesiology;  Laterality: N/A;   WISDOM TOOTH EXTRACTION      SOCIAL HISTORY:  Social History   Socioeconomic History   Marital status: Married    Spouse name: Keyuna Cuthrell   Number of children: Not on file   Years of education: 12   Highest education level: 12th grade  Occupational History   Occupation: Retired    Associate Professor: [employer]  Tobacco Use   Smoking status: Former    Current packs/day: 0.00    Average packs/day: 1 pack/day for 25.0 years (25.0 ttl pk-yrs)    Types: Cigarettes    Start date: 03/29/1986    Quit date: 03/30/2011    Years since quitting: 12.2    Passive exposure: Past   Smokeless  tobacco: Never  Vaping Use   Vaping status: Never Used  Substance and Sexual Activity   Alcohol use: Yes    Alcohol/week: 0.0 standard drinks of alcohol    Comment: socially   Drug use: No   Sexual activity: Not Currently    Partners: Male    Birth control/protection: Post-menopausal  Other Topics Concern   Not on file  Social History Narrative   Not on file   Social Determinants of Health   Financial Resource Strain: Low Risk  (04/21/2022)   Overall Financial Resource Strain (CARDIA)    Difficulty of Paying Living Expenses: Not hard at all  Food Insecurity: No Food Insecurity (04/21/2022)   Hunger Vital Sign    Worried About Running Out of Food in the Last Year: Never true    Ran Out of Food in the Last Year: Never true  Transportation Needs: No Transportation Needs (04/21/2022)   PRAPARE - Administrator, Civil Service (Medical): No    Lack of Transportation (Non-Medical): No  Physical Activity: Inactive (04/21/2022)   Exercise Vital Sign    Days of Exercise per Week: 0 days    Minutes of Exercise per Session: 0 min  Stress: No Stress Concern Present (04/21/2022)   [employer] of Occupational Health - Occupational Stress Questionnaire    Feeling of Stress : Only a little  Social Connections: Moderately Integrated (04/21/2022)   Social Connection and Isolation Panel [NHANES]    Frequency of Communication with Friends and Family: More than three times a week    Frequency of Social Gatherings with Friends and Family: More than three times a week    Attends Religious Services: Never    Database administrator or Organizations: Yes    Attends Engineer, structural: More than 4 times per year    Marital Status: Married  Catering manager Violence: Not At Risk (04/21/2022)   Humiliation, Afraid, Rape, and Kick questionnaire    Fear of Current or Ex-Partner: No    Emotionally Abused: No    Physically Abused: No    Sexually Abused: No    FAMILY HISTORY:   Family History  Problem Relation Age of Onset   Lung cancer Mother        smoked   Heart disease Father    Heart attack Brother    Diabetes Brother    Stroke Brother    Obesity Other    Colon cancer Neg Hx    Liver disease Neg Hx     CURRENT MEDICATIONS:  Outpatient Encounter Medications as of 07/03/2023  Medication Sig  hydrochlorothiazide (HYDRODIURIL) 25 MG tablet Take 25 mg by mouth every evening.    HYDROcodone-acetaminophen (NORCO) 5-325 MG tablet Take 1 tablet by mouth every 4 (four) hours as needed for up to 10 doses for moderate pain or severe pain.   metoprolol (LOPRESSOR) 50 MG tablet Take 50 mg by mouth daily.   mirtazapine (REMERON) 45 MG tablet Take 45 mg by mouth at bedtime.   nicotine polacrilex (COMMIT) 2 MG lozenge Take 2 mg by mouth as needed (stress).   nystatin-triamcinolone ointment (MYCOLOG) Apply 1 application topically 2 (two) times daily.   omeprazole (PRILOSEC) 20 MG capsule Take 20 mg by mouth daily.   quinapril (ACCUPRIL) 40 MG tablet Take 40 mg by mouth every evening.    venlafaxine XR (EFFEXOR-XR) 150 MG 24 hr capsule Take 150 mg by mouth every evening.    VRAYLAR 1.5 MG capsule Take 1.5 mg by mouth daily.   No facility-administered encounter medications on file as of 07/03/2023.    ALLERGIES:  Allergies  Allergen Reactions   Oxycodone Other (See Comments)    Hallucinations    LABORATORY DATA:  I have reviewed the labs as listed.  CBC    Component Value Date/Time   WBC 2.5 (L) 04/27/2023 0953   RBC 4.28 04/27/2023 0953   HGB 13.8 04/27/2023 0953   HGB 14.1 11/13/2017 1228   HCT 41.1 04/27/2023 0953   HCT 41.1 11/13/2017 1228   PLT 101 (L) 04/27/2023 0953   PLT 138 (L) 11/13/2017 1228   MCV 96.0 04/27/2023 0953   MCV 97 11/13/2017 1228   MCH 32.2 04/27/2023 0953   MCHC 33.6 04/27/2023 0953   RDW 14.0 04/27/2023 0953   RDW 14.6 11/13/2017 1228   LYMPHSABS 0.7 04/27/2023 0953   LYMPHSABS 1.2 11/13/2017 1228   MONOABS 0.4  04/27/2023 0953   EOSABS 0.0 04/27/2023 0953   EOSABS 0.0 11/13/2017 1228   BASOSABS 0.0 04/27/2023 0953   BASOSABS 0.0 11/13/2017 1228      Latest Ref Rng & Units 04/27/2023    9:53 AM 10/24/2022   11:06 AM 03/23/2022   12:24 PM  CMP  Glucose 70 - 99 mg/dL 657  846  962   BUN 8 - 23 mg/dL 14  12  11    Creatinine 0.44 - 1.00 mg/dL 9.52  8.41  3.24   Sodium 135 - 145 mmol/L 138  141  142   Potassium 3.5 - 5.1 mmol/L 3.3  3.7  3.6   Chloride 98 - 111 mmol/L 102  102  108   CO2 22 - 32 mmol/L 28  28  28    Calcium 8.9 - 10.3 mg/dL 8.9  8.6  8.7   Total Protein 6.5 - 8.1 g/dL 7.5  7.9  7.4   Total Bilirubin 0.3 - 1.2 mg/dL 1.5  1.4  1.5   Alkaline Phos 38 - 126 U/L 86  95  100   AST 15 - 41 U/L 47  50  50   ALT 0 - 44 U/L 21  21  24      DIAGNOSTIC IMAGING:  I have independently reviewed the relevant imaging and discussed with the patient.   WRAP UP:  All questions were answered. The patient knows to call the clinic with any problems, questions or concerns.  Medical decision making: Moderate  Time spent on visit: I spent 20 minutes counseling the patient face to face. The total time spent in the appointment was 30 minutes and more than 50% was on  counseling.  Carnella Guadalajara, PA-C  07/03/23 12:09 PM

## 2023-07-03 ENCOUNTER — Inpatient Hospital Stay: Payer: Medicare HMO | Admitting: *Deleted

## 2023-07-03 ENCOUNTER — Inpatient Hospital Stay: Payer: Medicare HMO | Attending: Physician Assistant | Admitting: Physician Assistant

## 2023-07-03 ENCOUNTER — Other Ambulatory Visit: Payer: Self-pay | Admitting: Physician Assistant

## 2023-07-03 VITALS — BP 170/75 | HR 89 | Temp 98.2°F | Resp 18 | Ht 61.5 in | Wt 160.8 lb

## 2023-07-03 DIAGNOSIS — C220 Liver cell carcinoma: Secondary | ICD-10-CM | POA: Diagnosis not present

## 2023-07-03 DIAGNOSIS — I10 Essential (primary) hypertension: Secondary | ICD-10-CM | POA: Diagnosis not present

## 2023-07-03 DIAGNOSIS — K7469 Other cirrhosis of liver: Secondary | ICD-10-CM | POA: Diagnosis not present

## 2023-07-03 DIAGNOSIS — K7460 Unspecified cirrhosis of liver: Secondary | ICD-10-CM | POA: Insufficient documentation

## 2023-07-03 DIAGNOSIS — D696 Thrombocytopenia, unspecified: Secondary | ICD-10-CM | POA: Insufficient documentation

## 2023-07-03 DIAGNOSIS — D72819 Decreased white blood cell count, unspecified: Secondary | ICD-10-CM | POA: Insufficient documentation

## 2023-07-03 LAB — CBC WITH DIFFERENTIAL/PLATELET
Abs Immature Granulocytes: 0 10*3/uL (ref 0.00–0.07)
Basophils Absolute: 0 10*3/uL (ref 0.0–0.1)
Basophils Relative: 1 %
Eosinophils Absolute: 0 10*3/uL (ref 0.0–0.5)
Eosinophils Relative: 0 %
HCT: 43.6 % (ref 36.0–46.0)
Hemoglobin: 14.5 g/dL (ref 12.0–15.0)
Immature Granulocytes: 0 %
Lymphocytes Relative: 33 %
Lymphs Abs: 0.7 10*3/uL (ref 0.7–4.0)
MCH: 31.8 pg (ref 26.0–34.0)
MCHC: 33.3 g/dL (ref 30.0–36.0)
MCV: 95.6 fL (ref 80.0–100.0)
Monocytes Absolute: 0.3 10*3/uL (ref 0.1–1.0)
Monocytes Relative: 16 %
Neutro Abs: 1 10*3/uL — ABNORMAL LOW (ref 1.7–7.7)
Neutrophils Relative %: 50 %
Platelets: 100 10*3/uL — ABNORMAL LOW (ref 150–400)
RBC: 4.56 MIL/uL (ref 3.87–5.11)
RDW: 14.9 % (ref 11.5–15.5)
WBC: 2 10*3/uL — ABNORMAL LOW (ref 4.0–10.5)
nRBC: 0 % (ref 0.0–0.2)

## 2023-07-03 NOTE — Patient Instructions (Signed)
[facility] at [facility] **VISIT SUMMARY & IMPORTANT INSTRUCTIONS **   You were seen today by Rojelio Brenner PA-C for your follow-up visit.    LOW PLATELETS & LOW WHITE BLOOD CELLS Your low platelets and white blood cells are related to your liver disease. Your platelets and white blood cells remain low, but stable in your baseline range.  HEPATOCELLULAR CARCINOMA Your most recent AFP is normal. It is important that you follow-up with other specialists for your [facility] and your liver cirrhosis. RADIOLOGY: You are overdue for 83-month follow-up with Dr. Lowella Dandy.  Please call his office at [phone] to schedule your appointment. GASTROENTEROLOGY: Since you do not want to see the liver specialist in [facility] any longer, I recommend that you at least establish with local gastroenterologist (stomach, intestine, liver specialist).  I have entered a referral for you to see Dr. Tasia Catchings, one of our local GI specialist.  If you have not heard from his office within the next week, please call them at [phone].    LABS: Return in 6 months for repeat labs  FOLLOW-UP APPOINTMENT: Office visit with Dr. Ellin Saba in 6 months  ** Thank you for trusting me with your healthcare!  I strive to provide all of my patients with quality care at each visit.  If you receive a survey for this visit, I would be so grateful to you for taking the time to provide feedback.  Thank you in advance!  ~ Tanikka Bresnan                   Dr. Doreatha Massed   &   Rojelio Brenner, PA-C   - - - - - - - - - - - - - - - - - -    Thank you for choosing [facility] at [facility] to provide your oncology and hematology care.  To afford each patient quality time with our provider, please arrive at least 15 minutes before your scheduled appointment time.   If you have a lab appointment with the Cancer Center please come in thru the Main Entrance and check in at the main information  desk.  You need to re-schedule your appointment should you arrive 10 or more minutes late.  We strive to give you quality time with our providers, and arriving late affects you and other patients whose appointments are after yours.  Also, if you no show three or more times for appointments you may be dismissed from the clinic at the providers discretion.     Again, thank you for choosing [facility].  Our hope is that these requests will decrease the amount of time that you wait before being seen by our physicians.       _____________________________________________________________  Should you have questions after your visit to [facility], please contact our office at [phone] and follow the prompts.  Our office hours are 8:00 a.m. and 4:30 p.m. Monday - Friday.  Please note that voicemails left after 4:00 p.m. may not be returned until the following business day.  We are closed weekends and major holidays.  You do have access to a nurse 24-7, just call the main number to the clinic [phone] and do not press any options, hold on the line and a nurse will answer the phone.    For prescription refill requests, have your pharmacy contact our office and allow 72 hours.

## 2023-07-04 ENCOUNTER — Ambulatory Visit: Payer: Medicare HMO | Admitting: Family

## 2023-07-04 ENCOUNTER — Encounter: Payer: Self-pay | Admitting: Family

## 2023-07-04 ENCOUNTER — Other Ambulatory Visit (INDEPENDENT_AMBULATORY_CARE_PROVIDER_SITE_OTHER): Payer: Self-pay

## 2023-07-04 ENCOUNTER — Other Ambulatory Visit (INDEPENDENT_AMBULATORY_CARE_PROVIDER_SITE_OTHER): Payer: Medicare HMO

## 2023-07-04 DIAGNOSIS — M1712 Unilateral primary osteoarthritis, left knee: Secondary | ICD-10-CM | POA: Diagnosis not present

## 2023-07-04 DIAGNOSIS — M25561 Pain in right knee: Secondary | ICD-10-CM

## 2023-07-04 DIAGNOSIS — M533 Sacrococcygeal disorders, not elsewhere classified: Secondary | ICD-10-CM | POA: Diagnosis not present

## 2023-07-04 DIAGNOSIS — G8929 Other chronic pain: Secondary | ICD-10-CM

## 2023-07-04 DIAGNOSIS — M1711 Unilateral primary osteoarthritis, right knee: Secondary | ICD-10-CM

## 2023-07-04 DIAGNOSIS — M25562 Pain in left knee: Secondary | ICD-10-CM | POA: Diagnosis not present

## 2023-07-04 NOTE — Progress Notes (Signed)
Office Visit Note   Patient: Raven Lee           Date of Birth: 02-Aug-1951           MRN: [id number] Visit Date: 07/04/2023              Requested by: Benita Stabile, MD [address] [place],  [place] [address] PCP: Benita Stabile, MD  Chief Complaint  Patient presents with   Right Knee - Pain   Left Knee - Pain      HPI: The patient is a 72 year old woman who presents today for 2 separate issues.  #1 knee pain.  She states she has had mild chronic knee pain bilaterally for months to years.  The left is worse than the right.  She has been having frequent falls she reports that her left knee intensified after a fall with direct impact fell to her knees after stepping off of a bottom stair has had medial knee pain since she denies any associated swelling or bruising no mechanical symptoms.  She does have start up stiffness and pain descending stairs  #2 tailbone pain about a [place] with direct impact on her buttocks.  She has been having pain right to her tailbone tenderness since pain with sitting no associated shooting burning no buttock pain no thigh or lower extremity pain no back pain  Assessment & Plan: Visit Diagnoses:  1. Chronic pain of both knees   2. Coccyx pain     Plan: Reassurance provided.  Conservative measures for the tailbone discussed pressure relieving donuts for sitting.  Discussed the knee pain possible meniscus injury on the left she will use anti-inflammatories twice daily for the next 2 weeks.  She would like to defer Depo-Medrol injection.  Follow-Up Instructions: No follow-ups on file.   Left Knee Exam   Muscle Strength  The patient has normal left knee strength.  Tenderness  The patient is experiencing tenderness in the medial joint line.  Tests  Varus: negative Valgus: negative  Other  Erythema: absent Effusion: no effusion present   Back Exam   Tenderness  Back tenderness location:  Coccyx.  Muscle Strength  The patient has normal back strength.  Other  Gait: normal       Patient is alert, oriented, no adenopathy, well-dressed, normal affect, normal respiratory effort.   Imaging: No results found. No images are attached to the encounter.  Labs: No results found for: "HGBA1C", "ESRSEDRATE", "CRP", "LABURIC", "REPTSTATUS", "GRAMSTAIN", "CULT", "LABORGA"   Lab Results  Component Value Date   ALBUMIN 3.4 (L) 04/27/2023   ALBUMIN 3.4 (L) 10/24/2022   ALBUMIN 3.3 (L) 03/23/2022    No results found for: "MG" No results found for: "VD25OH"  No results found for: "PREALBUMIN"    Latest Ref Rng & Units 07/03/2023    3:30 PM 04/27/2023    9:53 AM 10/24/2022   11:06 AM  CBC EXTENDED  WBC 4.0 - 10.5 K/uL 2.0  2.5  2.2   RBC 3.87 - 5.11 MIL/uL 4.56  4.28  4.36   Hemoglobin 12.0 - 15.0 g/dL 36.1  44.3  15.4   HCT 36.0 - 46.0 % 43.6  41.1  43.0   Platelets 150 - 400 K/uL 100  101  95   NEUT# 1.7 - 7.7 K/uL 1.0  1.3  1.1   Lymph# 0.7 - 4.0 K/uL 0.7  0.7  0.7  There is no height or weight on file to calculate BMI.  Orders:  Orders Placed This Encounter  Procedures   XR Knee 1-2 Views Right   XR Knee 1-2 Views Left   XR Sacrum/Coccyx   No orders of the defined types were placed in this encounter.    Procedures: No procedures performed  Clinical Data: No additional findings.  ROS:  All other systems negative, except as noted in the HPI. Review of Systems  Objective: Vital Signs: There were no vitals taken for this visit.  Specialty Comments:  No specialty comments available.  PMFS History: Patient Active Problem List   Diagnosis Date Noted   Hepatocellular carcinoma (HCC) 11/03/2021   Sprain of tibiofibular ligament of right ankle 04/12/2018   Cirrhosis (HCC) 10/23/2017   Chronic hepatitis C without hepatic coma (HCC) 05/09/2017   Abnormal LFTs 05/09/2017   Morbid obesity (HCC) 12/01/2014   COPD GOLD II 07/05/2014   Dyspnea  07/04/2014   Past Medical History:  Diagnosis Date   Anxiety    Difficulty sleeping    Emphysema/COPD (HCC)    Hepatitis    HEPATITIS C   History of panic attacks    Hypertension    Liver lesion    Pneumonia    Psoriasis     Family History  Problem Relation Age of Onset   Lung cancer Mother        smoked   Heart disease Father    Heart attack Brother    Diabetes Brother    Stroke Brother    Obesity Other    Colon cancer Neg Hx    Liver disease Neg Hx     Past Surgical History:  Procedure Laterality Date   ABDOMINAL HYSTERECTOMY     COLONOSCOPY  2009   [facility]: diverticulosis   IR RADIOLOGIST EVAL & MGMT  09/01/2021   IR RADIOLOGIST EVAL & MGMT  02/08/2022   IR RADIOLOGIST EVAL & MGMT  06/07/2022   LAPAROSCOPIC GASTRIC SLEEVE RESECTION N/A 12/01/2014   Procedure: LAPAROSCOPIC GASTRIC SLEEVE RESECTION UPPER ENDO;  Surgeon: Glenna Fellows, MD;  Location: [facility];  Service: General;  Laterality: N/A;   LUNG LOBECTOMY  03/2011   LLL-severe PNA   RADIOFREQUENCY ABLATION N/A 10/13/2021   Procedure: CT MICROWAVE ABLATION;  Surgeon: Richarda Overlie, MD;  Location: [facility];  Service: Anesthesiology;  Laterality: N/A;   RADIOFREQUENCY ABLATION N/A 11/03/2021   Procedure: CT MICROWAVE ABLATION;  Surgeon: Richarda Overlie, MD;  Location: [facility];  Service: Anesthesiology;  Laterality: N/A;   WISDOM TOOTH EXTRACTION     Social History   Occupational History   Occupation: Retired    Associate Professor: [employer]  Tobacco Use   Smoking status: Former    Current packs/day: 0.00    Average packs/day: 1 pack/day for 25.0 years (25.0 ttl pk-yrs)    Types: Cigarettes    Start date: 03/29/1986    Quit date: 03/30/2011    Years since quitting: 12.2    Passive exposure: Past   Smokeless tobacco: Never  Vaping Use   Vaping status: Never Used  Substance and Sexual Activity   Alcohol use: Yes    Alcohol/week: 0.0 standard drinks of alcohol    Comment: socially   Drug use: No   Sexual activity: Not Currently     Partners: Male    Birth control/protection: Post-menopausal

## 2023-07-05 ENCOUNTER — Other Ambulatory Visit (HOSPITAL_COMMUNITY): Payer: Self-pay | Admitting: Nurse Practitioner

## 2023-07-05 DIAGNOSIS — I10 Essential (primary) hypertension: Secondary | ICD-10-CM | POA: Diagnosis not present

## 2023-07-05 DIAGNOSIS — R300 Dysuria: Secondary | ICD-10-CM | POA: Diagnosis not present

## 2023-07-05 DIAGNOSIS — Z8505 Personal history of malignant neoplasm of liver: Secondary | ICD-10-CM | POA: Diagnosis not present

## 2023-07-05 DIAGNOSIS — D696 Thrombocytopenia, unspecified: Secondary | ICD-10-CM | POA: Diagnosis not present

## 2023-07-05 DIAGNOSIS — B182 Chronic viral hepatitis C: Secondary | ICD-10-CM | POA: Diagnosis not present

## 2023-07-05 DIAGNOSIS — Z9181 History of falling: Secondary | ICD-10-CM | POA: Diagnosis not present

## 2023-07-05 DIAGNOSIS — F329 Major depressive disorder, single episode, unspecified: Secondary | ICD-10-CM | POA: Diagnosis not present

## 2023-07-05 DIAGNOSIS — N390 Urinary tract infection, site not specified: Secondary | ICD-10-CM | POA: Diagnosis not present

## 2023-07-05 DIAGNOSIS — F5104 Psychophysiologic insomnia: Secondary | ICD-10-CM | POA: Diagnosis not present

## 2023-07-05 DIAGNOSIS — Z79899 Other long term (current) drug therapy: Secondary | ICD-10-CM | POA: Diagnosis not present

## 2023-07-05 DIAGNOSIS — R32 Unspecified urinary incontinence: Secondary | ICD-10-CM | POA: Diagnosis not present

## 2023-07-05 DIAGNOSIS — Z780 Asymptomatic menopausal state: Secondary | ICD-10-CM

## 2023-07-05 DIAGNOSIS — H3530 Unspecified macular degeneration: Secondary | ICD-10-CM | POA: Diagnosis not present

## 2023-07-11 ENCOUNTER — Encounter (INDEPENDENT_AMBULATORY_CARE_PROVIDER_SITE_OTHER): Payer: Self-pay | Admitting: *Deleted

## 2023-07-11 ENCOUNTER — Encounter: Payer: Self-pay | Admitting: Physician Assistant

## 2023-07-26 DIAGNOSIS — H353221 Exudative age-related macular degeneration, left eye, with active choroidal neovascularization: Secondary | ICD-10-CM | POA: Diagnosis not present

## 2023-08-09 DIAGNOSIS — I10 Essential (primary) hypertension: Secondary | ICD-10-CM | POA: Diagnosis not present

## 2023-08-09 DIAGNOSIS — R7301 Impaired fasting glucose: Secondary | ICD-10-CM | POA: Diagnosis not present

## 2023-08-15 ENCOUNTER — Other Ambulatory Visit (HOSPITAL_COMMUNITY): Payer: Self-pay | Admitting: Family Medicine

## 2023-08-15 DIAGNOSIS — D72819 Decreased white blood cell count, unspecified: Secondary | ICD-10-CM | POA: Diagnosis not present

## 2023-08-15 DIAGNOSIS — Z1231 Encounter for screening mammogram for malignant neoplasm of breast: Secondary | ICD-10-CM

## 2023-08-15 DIAGNOSIS — E876 Hypokalemia: Secondary | ICD-10-CM | POA: Diagnosis not present

## 2023-08-15 DIAGNOSIS — B182 Chronic viral hepatitis C: Secondary | ICD-10-CM | POA: Diagnosis not present

## 2023-08-15 DIAGNOSIS — I10 Essential (primary) hypertension: Secondary | ICD-10-CM | POA: Diagnosis not present

## 2023-08-15 DIAGNOSIS — H3530 Unspecified macular degeneration: Secondary | ICD-10-CM | POA: Diagnosis not present

## 2023-08-15 DIAGNOSIS — F329 Major depressive disorder, single episode, unspecified: Secondary | ICD-10-CM | POA: Diagnosis not present

## 2023-08-15 DIAGNOSIS — Z0000 Encounter for general adult medical examination without abnormal findings: Secondary | ICD-10-CM | POA: Diagnosis not present

## 2023-08-15 DIAGNOSIS — N3281 Overactive bladder: Secondary | ICD-10-CM | POA: Diagnosis not present

## 2023-08-15 DIAGNOSIS — Z0001 Encounter for general adult medical examination with abnormal findings: Secondary | ICD-10-CM | POA: Diagnosis not present

## 2023-08-15 DIAGNOSIS — D696 Thrombocytopenia, unspecified: Secondary | ICD-10-CM | POA: Diagnosis not present

## 2023-08-15 DIAGNOSIS — Z9181 History of falling: Secondary | ICD-10-CM | POA: Diagnosis not present

## 2023-08-15 DIAGNOSIS — F5104 Psychophysiologic insomnia: Secondary | ICD-10-CM | POA: Diagnosis not present

## 2023-09-13 DIAGNOSIS — H353221 Exudative age-related macular degeneration, left eye, with active choroidal neovascularization: Secondary | ICD-10-CM | POA: Diagnosis not present

## 2023-09-21 DIAGNOSIS — N3281 Overactive bladder: Secondary | ICD-10-CM | POA: Diagnosis not present

## 2023-09-21 DIAGNOSIS — F5104 Psychophysiologic insomnia: Secondary | ICD-10-CM | POA: Diagnosis not present

## 2023-09-21 DIAGNOSIS — F329 Major depressive disorder, single episode, unspecified: Secondary | ICD-10-CM | POA: Diagnosis not present

## 2023-10-11 DIAGNOSIS — H353221 Exudative age-related macular degeneration, left eye, with active choroidal neovascularization: Secondary | ICD-10-CM | POA: Diagnosis not present

## 2023-11-22 DIAGNOSIS — H353221 Exudative age-related macular degeneration, left eye, with active choroidal neovascularization: Secondary | ICD-10-CM | POA: Diagnosis not present

## 2023-11-30 DIAGNOSIS — H524 Presbyopia: Secondary | ICD-10-CM | POA: Diagnosis not present

## 2023-12-18 DIAGNOSIS — I10 Essential (primary) hypertension: Secondary | ICD-10-CM | POA: Diagnosis not present

## 2023-12-18 DIAGNOSIS — R7301 Impaired fasting glucose: Secondary | ICD-10-CM | POA: Diagnosis not present

## 2023-12-21 ENCOUNTER — Other Ambulatory Visit (HOSPITAL_COMMUNITY): Payer: Self-pay | Admitting: Nurse Practitioner

## 2023-12-21 DIAGNOSIS — D72819 Decreased white blood cell count, unspecified: Secondary | ICD-10-CM | POA: Diagnosis not present

## 2023-12-21 DIAGNOSIS — Z1231 Encounter for screening mammogram for malignant neoplasm of breast: Secondary | ICD-10-CM | POA: Diagnosis not present

## 2023-12-21 DIAGNOSIS — H3530 Unspecified macular degeneration: Secondary | ICD-10-CM | POA: Diagnosis not present

## 2023-12-21 DIAGNOSIS — Z780 Asymptomatic menopausal state: Secondary | ICD-10-CM

## 2023-12-21 DIAGNOSIS — I10 Essential (primary) hypertension: Secondary | ICD-10-CM | POA: Diagnosis not present

## 2023-12-21 DIAGNOSIS — E876 Hypokalemia: Secondary | ICD-10-CM | POA: Diagnosis not present

## 2023-12-21 DIAGNOSIS — B182 Chronic viral hepatitis C: Secondary | ICD-10-CM | POA: Diagnosis not present

## 2023-12-21 DIAGNOSIS — N3281 Overactive bladder: Secondary | ICD-10-CM | POA: Diagnosis not present

## 2023-12-21 DIAGNOSIS — F5104 Psychophysiologic insomnia: Secondary | ICD-10-CM | POA: Diagnosis not present

## 2023-12-21 DIAGNOSIS — F329 Major depressive disorder, single episode, unspecified: Secondary | ICD-10-CM | POA: Diagnosis not present

## 2023-12-21 DIAGNOSIS — D696 Thrombocytopenia, unspecified: Secondary | ICD-10-CM | POA: Diagnosis not present

## 2023-12-21 DIAGNOSIS — Z8505 Personal history of malignant neoplasm of liver: Secondary | ICD-10-CM | POA: Diagnosis not present

## 2023-12-21 DIAGNOSIS — Z9181 History of falling: Secondary | ICD-10-CM | POA: Diagnosis not present

## 2023-12-22 ENCOUNTER — Other Ambulatory Visit (HOSPITAL_COMMUNITY): Payer: Self-pay | Admitting: Nurse Practitioner

## 2023-12-22 DIAGNOSIS — Z8505 Personal history of malignant neoplasm of liver: Secondary | ICD-10-CM

## 2024-01-01 ENCOUNTER — Inpatient Hospital Stay: Payer: Medicare HMO | Attending: Hematology

## 2024-01-01 ENCOUNTER — Ambulatory Visit (HOSPITAL_COMMUNITY)
Admission: RE | Admit: 2024-01-01 | Discharge: 2024-01-01 | Disposition: A | Discharge status: Home / Self Care | Source: Ambulatory Visit | Attending: Nurse Practitioner | Admitting: Nurse Practitioner

## 2024-01-01 DIAGNOSIS — D696 Thrombocytopenia, unspecified: Secondary | ICD-10-CM | POA: Diagnosis not present

## 2024-01-01 DIAGNOSIS — C220 Liver cell carcinoma: Secondary | ICD-10-CM

## 2024-01-01 DIAGNOSIS — Z8505 Personal history of malignant neoplasm of liver: Secondary | ICD-10-CM | POA: Diagnosis not present

## 2024-01-01 DIAGNOSIS — K7469 Other cirrhosis of liver: Secondary | ICD-10-CM

## 2024-01-01 DIAGNOSIS — Z08 Encounter for follow-up examination after completed treatment for malignant neoplasm: Secondary | ICD-10-CM | POA: Diagnosis not present

## 2024-01-01 DIAGNOSIS — R161 Splenomegaly, not elsewhere classified: Secondary | ICD-10-CM | POA: Diagnosis not present

## 2024-01-01 DIAGNOSIS — R188 Other ascites: Secondary | ICD-10-CM | POA: Diagnosis not present

## 2024-01-01 DIAGNOSIS — D72819 Decreased white blood cell count, unspecified: Secondary | ICD-10-CM | POA: Insufficient documentation

## 2024-01-01 DIAGNOSIS — K7689 Other specified diseases of liver: Secondary | ICD-10-CM | POA: Diagnosis not present

## 2024-01-01 LAB — COMPREHENSIVE METABOLIC PANEL WITH GFR
ALT: 25 U/L (ref 0–44)
AST: 61 U/L — ABNORMAL HIGH (ref 15–41)
Albumin: 3.6 g/dL (ref 3.5–5.0)
Alkaline Phosphatase: 73 U/L (ref 38–126)
Anion gap: 12 (ref 5–15)
BUN: 9 mg/dL (ref 8–23)
CO2: 28 mmol/L (ref 22–32)
Calcium: 9.4 mg/dL (ref 8.9–10.3)
Chloride: 97 mmol/L — ABNORMAL LOW (ref 98–111)
Creatinine, Ser: 0.7 mg/dL (ref 0.44–1.00)
GFR, Estimated: >60 mL/min (ref >60)
Glucose, Bld: 99 mg/dL (ref 70–99)
Potassium: 3.6 mmol/L (ref 3.5–5.1)
Sodium: 137 mmol/L (ref 135–145)
Total Bilirubin: 1.9 mg/dL — ABNORMAL HIGH (ref 0.0–1.2)
Total Protein: 8.1 g/dL (ref 6.5–8.1)

## 2024-01-01 LAB — FOLATE: Folate: 8.1 ng/mL (ref >5.9)

## 2024-01-01 LAB — CBC WITH DIFFERENTIAL/PLATELET
Abs Immature Granulocytes: 0 10*3/uL (ref 0.00–0.07)
Basophils Absolute: 0 10*3/uL (ref 0.0–0.1)
Basophils Relative: 0 %
Eosinophils Absolute: 0 10*3/uL (ref 0.0–0.5)
Eosinophils Relative: 0 %
HCT: 42 % (ref 36.0–46.0)
Hemoglobin: 14.3 g/dL (ref 12.0–15.0)
Lymphocytes Relative: 34 %
Lymphs Abs: 0.5 10*3/uL — ABNORMAL LOW (ref 0.7–4.0)
MCH: 32.7 pg (ref 26.0–34.0)
MCHC: 34 g/dL (ref 30.0–36.0)
MCV: 96.1 fL (ref 80.0–100.0)
Monocytes Absolute: 0.1 10*3/uL (ref 0.1–1.0)
Monocytes Relative: 8 %
Neutro Abs: 0.9 10*3/uL — ABNORMAL LOW (ref 1.7–7.7)
Neutrophils Relative %: 58 %
Platelets: 81 10*3/uL — ABNORMAL LOW (ref 150–400)
RBC: 4.37 MIL/uL (ref 3.87–5.11)
RDW: 15.7 % — ABNORMAL HIGH (ref 11.5–15.5)
Smear Review: DECREASED
WBC: 1.6 10*3/uL — ABNORMAL LOW (ref 4.0–10.5)
nRBC: 0 % (ref 0.0–0.2)

## 2024-01-01 LAB — VITAMIN B12: Vitamin B-12: 505 pg/mL (ref 180–914)

## 2024-01-01 MED ORDER — GADOBUTROL 1 MMOL/ML IV SOLN
7.0000 mL | Freq: Once | INTRAVENOUS | Status: AC | PRN
  Administered 2024-01-01: 7 mL via INTRAVENOUS

## 2024-01-02 ENCOUNTER — Ambulatory Visit (HOSPITAL_COMMUNITY): Admission: RE | Admit: 2024-01-02 | Source: Ambulatory Visit

## 2024-01-02 LAB — AFP TUMOR MARKER: AFP, Serum, Tumor Marker: 5.9 ng/mL (ref 0.0–9.2)

## 2024-01-03 ENCOUNTER — Ambulatory Visit (HOSPITAL_COMMUNITY)

## 2024-01-03 ENCOUNTER — Other Ambulatory Visit (HOSPITAL_COMMUNITY)

## 2024-01-04 LAB — METHYLMALONIC ACID, SERUM: Methylmalonic Acid, Quantitative: 150 nmol/L (ref 0–378)

## 2024-01-05 ENCOUNTER — Encounter (HOSPITAL_COMMUNITY): Payer: Self-pay

## 2024-01-08 ENCOUNTER — Inpatient Hospital Stay: Payer: Medicare HMO | Admitting: Hematology

## 2024-01-08 NOTE — Progress Notes (Incomplete)
 [facility] [address] [place] [address]    Clinic Day:  01/08/2024  Referring physician: Omie Bickers, MD  Patient Care Team: Omie Bickers, MD as PCP - General (Internal Medicine) Riley Cheadle Windsor Hatcher, MD as Consulting Physician (Gastroenterology)   ASSESSMENT & PLAN:   Assessment: 1.  Leukopenia and thrombocytopenia: - Patient seen at the request of Dr. Quentin Brunner office. - CBC on 02/15/2021 with white count 2.4, ANC 1.3, platelet count 84.  Hemoglobin was normal at 15.1.  There was question of aggregation of platelets. - She has mildly decreased platelet count since 2016. - White count is decreased since 2018. - No B symptoms or recurrent infections. 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 - MRI of the abdomen on 09/12/2019 with hepatic steatosis, cirrhosis with stigmata of portal venous hypertension including borderline splenomegaly and varices.  2.  Social/family history: - She lives at home with her husband. - She smoked 1 and half pack per day for 40 years and quit in 2012. - She is currently working at [employer] daily.  She worked for a [employer] prior to that.  No history of chemical exposure. - No family history of low platelets.  Mother died of lung cancer.  Maternal aunt died of cancer.   3.  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 - Evaluated by transplant team on 10/11/2021 not a candidate for transplant. - Microwave ablation on 11/03/2021 by Dr. Julietta Ogren.  Plan: 1.  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  Spleen size was normal on previous imaging.  Previous nutritional deficiency workup was negative. - Reviewed labs from 10/24/2022.  Platelet count is stable at 95, WBC at 2.2.  ANC is 1.1.  No recurrent infections.  Physical exam today did not reveal any palpable adenopathy or splenomegaly. - Recommend follow-up in 6 months with repeat labs.  2.  Well to moderately  differentiated HCC: - AFP is normal at 6.0.  Last MRI was on 05/31/2022 which showed stable lesions. - She has an MRI scheduled next month and follow-up with Dr. Julietta Ogren.  No orders of the defined types were placed in this encounter.     [name],acting as a Neurosurgeon for Paulett Boros, MD.,have documented all relevant documentation on the behalf of Paulett Boros, MD,as directed by  Paulett Boros, MD while in the presence of Paulett Boros, MD.  ***   [place]   5/12/20254:37 PM  CHIEF COMPLAINT:   Diagnosis: leukopenia and thrombocytopenia     Cancer Staging  No matching staging information was found for the patient.    Prior Therapy: none  Current Therapy:  surveillance    HISTORY OF PRESENT ILLNESS:   Oncology History   No history exists.     INTERVAL HISTORY:   Raven Lee is a 73 y.o. female presenting to clinic today for follow up of liver cancer, leukopenia, and thrombocytopenia. She was last seen by me on 10/26/22.  Today, she states that she is doing well overall. Her appetite level is at ***%. Her energy level is at ***%.   PAST MEDICAL HISTORY:   Past Medical History: Past Medical History:  Diagnosis Date   Anxiety    Difficulty sleeping    Emphysema/COPD (HCC)    Hepatitis    HEPATITIS C   History of panic attacks    Hypertension    Liver lesion    Pneumonia  Psoriasis     Surgical History: Past Surgical History:  Procedure Laterality Date   ABDOMINAL HYSTERECTOMY     COLONOSCOPY  2009   [facility]: diverticulosis   IR RADIOLOGIST EVAL & MGMT  09/01/2021   IR RADIOLOGIST EVAL & MGMT  02/08/2022   IR RADIOLOGIST EVAL & MGMT  06/07/2022   LAPAROSCOPIC GASTRIC SLEEVE RESECTION N/A 12/01/2014   Procedure: LAPAROSCOPIC GASTRIC SLEEVE RESECTION UPPER ENDO;  Surgeon: Ayesha Lente, MD;  Location: [facility];  Service: General;  Laterality: N/A;   LUNG LOBECTOMY  03/2011   LLL-severe PNA   RADIOFREQUENCY ABLATION N/A  10/13/2021   Procedure: CT MICROWAVE ABLATION;  Surgeon: Elene Griffes, MD;  Location: [facility];  Service: Anesthesiology;  Laterality: N/A;   RADIOFREQUENCY ABLATION N/A 11/03/2021   Procedure: CT MICROWAVE ABLATION;  Surgeon: Elene Griffes, MD;  Location: [facility];  Service: Anesthesiology;  Laterality: N/A;   WISDOM TOOTH EXTRACTION      Social History: Social History   Socioeconomic History   Marital status: Married    Spouse name: Arreanna Mangine   Number of children: Not on file   Years of education: 12   Highest education level: 12th grade  Occupational History   Occupation: Retired    Associate Professor: [employer]  Tobacco Use   Smoking status: Former    Current packs/day: 0.00    Average packs/day: 1 pack/day for 25.0 years (25.0 ttl pk-yrs)    Types: Cigarettes    Start date: 03/29/1986    Quit date: 03/30/2011    Years since quitting: 12.7    Passive exposure: Past   Smokeless tobacco: Never  Vaping Use   Vaping status: Never Used  Substance and Sexual Activity   Alcohol use: Yes    Alcohol/week: 0.0 standard drinks of alcohol    Comment: socially   Drug use: No   Sexual activity: Not Currently    Partners: Male    Birth control/protection: Post-menopausal  Other Topics Concern   Not on file  Social History Narrative   Not on file   Social Drivers of Health   Financial Resource Strain: Low Risk  (04/21/2022)   Overall Financial Resource Strain (CARDIA)    Difficulty of Paying Living Expenses: Not hard at all  Food Insecurity: No Food Insecurity (04/21/2022)   Hunger Vital Sign    Worried About Running Out of Food in the Last Year: Never true    Ran Out of Food in the Last Year: Never true  Transportation Needs: No Transportation Needs (04/21/2022)   PRAPARE - Administrator, Civil Service (Medical): No    Lack of Transportation (Non-Medical): No  Physical Activity: Inactive (04/21/2022)   Exercise Vital Sign    Days of Exercise per Week: 0 days    Minutes of Exercise  per Session: 0 min  Stress: No Stress Concern Present (04/21/2022)   [employer] of Occupational Health - Occupational Stress Questionnaire    Feeling of Stress : Only a little  Social Connections: Moderately Integrated (04/21/2022)   Social Connection and Isolation Panel [NHANES]    Frequency of Communication with Friends and Family: More than three times a week    Frequency of Social Gatherings with Friends and Family: More than three times a week    Attends Religious Services: Never    Database administrator or Organizations: Yes    Attends Engineer, structural: More than 4 times per year    Marital Status: Married  Catering manager  Violence: Not At Risk (04/21/2022)   Humiliation, Afraid, Rape, and Kick questionnaire    Fear of Current or Ex-Partner: No    Emotionally Abused: No    Physically Abused: No    Sexually Abused: No    Family History: Family History  Problem Relation Age of Onset   Lung cancer Mother        smoked   Heart disease Father    Heart attack Brother    Diabetes Brother    Stroke Brother    Obesity Other    Colon cancer Neg Hx    Liver disease Neg Hx     Current Medications:  Current Outpatient Medications:    hydrochlorothiazide  (HYDRODIURIL ) 25 MG tablet, Take 25 mg by mouth every evening. , Disp: , Rfl:    HYDROcodone -acetaminophen  (NORCO) 5-325 MG tablet, Take 1 tablet by mouth every 4 (four) hours as needed for up to 10 doses for moderate pain or severe pain., Disp: 10 tablet, Rfl: 0   metoprolol  (LOPRESSOR ) 50 MG tablet, Take 50 mg by mouth daily., Disp: , Rfl:    mirtazapine (REMERON) 45 MG tablet, Take 45 mg by mouth at bedtime., Disp: , Rfl:    nicotine polacrilex (COMMIT) 2 MG lozenge, Take 2 mg by mouth as needed (stress)., Disp: , Rfl:    nystatin -triamcinolone  ointment (MYCOLOG), Apply 1 application topically 2 (two) times daily., Disp: 30 g, Rfl: 11   omeprazole (PRILOSEC) 20 MG capsule, Take 20 mg by mouth daily., Disp: ,  Rfl:    quinapril  (ACCUPRIL ) 40 MG tablet, Take 40 mg by mouth every evening. , Disp: , Rfl:    venlafaxine  XR (EFFEXOR -XR) 150 MG 24 hr capsule, Take 150 mg by mouth every evening. , Disp: , Rfl:    VRAYLAR 1.5 MG capsule, Take 1.5 mg by mouth daily., Disp: , Rfl:    Allergies: Allergies  Allergen Reactions   Oxycodone Other (See Comments)    Hallucinations    REVIEW OF SYSTEMS:   Review of Systems  Constitutional:  Negative for chills, fatigue and fever.  HENT:   Negative for lump/mass, mouth sores, nosebleeds, sore throat and trouble swallowing.   Eyes:  Negative for eye problems.  Respiratory:  Negative for cough and shortness of breath.   Cardiovascular:  Negative for chest pain, leg swelling and palpitations.  Gastrointestinal:  Negative for abdominal pain, constipation, diarrhea, nausea and vomiting.  Genitourinary:  Negative for bladder incontinence, difficulty urinating, dysuria, frequency, hematuria and nocturia.   Musculoskeletal:  Negative for arthralgias, back pain, flank pain, myalgias and neck pain.  Skin:  Negative for itching and rash.  Neurological:  Negative for dizziness, headaches and numbness.  Hematological:  Does not bruise/bleed easily.  Psychiatric/Behavioral:  Negative for depression, sleep disturbance and suicidal ideas. The patient is not nervous/anxious.   All other systems reviewed and are negative.    VITALS:   There were no vitals taken for this visit.  Wt Readings from Last 3 Encounters:  07/03/23 160 lb 12.8 oz (72.9 kg)  10/26/22 169 lb 4.8 oz (76.8 kg)  04/18/22 159 lb 11.2 oz (72.4 kg)    There is no height or weight on file to calculate BMI.  Performance status (ECOG): 1 - Symptomatic but completely ambulatory  PHYSICAL EXAM:   Physical Exam Vitals and nursing note reviewed. Exam conducted with a chaperone present.  Constitutional:      Appearance: Normal appearance.  Cardiovascular:     Rate and Rhythm: Normal rate and regular  rhythm.     Pulses: Normal pulses.     Heart sounds: Normal heart sounds.  Pulmonary:     Effort: Pulmonary effort is normal.     Breath sounds: Normal breath sounds.  Abdominal:     Palpations: Abdomen is soft. There is no hepatomegaly, splenomegaly or mass.     Tenderness: There is no abdominal tenderness.  Musculoskeletal:     Right lower leg: No edema.     Left lower leg: No edema.  Lymphadenopathy:     Cervical: No cervical adenopathy.     Right cervical: No superficial, deep or posterior cervical adenopathy.    Left cervical: No superficial, deep or posterior cervical adenopathy.     Upper Body:     Right upper body: No supraclavicular or axillary adenopathy.     Left upper body: No supraclavicular or axillary adenopathy.  Neurological:     General: No focal deficit present.     Mental Status: She is alert and oriented to person, place, and time.  Psychiatric:        Mood and Affect: Mood normal.        Behavior: Behavior normal.     LABS:      Latest Ref Rng & Units 01/01/2024    1:20 PM 07/03/2023    3:30 PM 04/27/2023    9:53 AM  CBC  WBC 4.0 - 10.5 K/uL 1.6  2.0  2.5   Hemoglobin 12.0 - 15.0 g/dL 08.6  57.8  46.9   Hematocrit 36.0 - 46.0 % 42.0  43.6  41.1   Platelets 150 - 400 K/uL 81  100  101       Latest Ref Rng & Units 01/01/2024    1:20 PM 04/27/2023    9:53 AM 10/24/2022   11:06 AM  CMP  Glucose 70 - 99 mg/dL 99  629  528   BUN 8 - 23 mg/dL 9  14  12    Creatinine 0.44 - 1.00 mg/dL 4.13  2.44  0.10   Sodium 135 - 145 mmol/L 137  138  141   Potassium 3.5 - 5.1 mmol/L 3.6  3.3  3.7   Chloride 98 - 111 mmol/L 97  102  102   CO2 22 - 32 mmol/L 28  28  28    Calcium 8.9 - 10.3 mg/dL 9.4  8.9  8.6   Total Protein 6.5 - 8.1 g/dL 8.1  7.5  7.9   Total Bilirubin 0.0 - 1.2 mg/dL 1.9  1.5  1.4   Alkaline Phos 38 - 126 U/L 73  86  95   AST 15 - 41 U/L 61  47  50   ALT 0 - 44 U/L 25  21  21     Component     Latest Ref Rng 11/16/2021 03/23/2022 10/24/2022  AFP,  Serum, Tumor Marker     0.0 - 9.2 ng/mL 9.3 (H)  6.8  6.0      No results found for: "CEA1", "CEA" / No results found for: "CEA1", "CEA" No results found for: "PSA1" No results found for: "UVO536" No results found for: "CAN125"  No results found for: "TOTALPROTELP", "ALBUMINELP", "A1GS", "A2GS", "BETS", "BETA2SER", "GAMS", "MSPIKE", "SPEI" Lab Results  Component Value Date   TIBC 306 11/13/2017   TIBC 326 05/11/2017   FERRITIN 131 11/13/2017   FERRITIN 112 05/11/2017   IRONPCTSAT 73 (H) 11/13/2017   IRONPCTSAT 47 05/11/2017   Lab Results  Component Value Date   LDH 247 (  H) 03/23/2022   LDH 256 (H) 05/28/2021     STUDIES:   No results found.

## 2024-01-08 NOTE — Progress Notes (Incomplete)
 [facility] [address] [place] [address]    Clinic Day:  01/08/2024  Referring physician: Omie Bickers, MD  Patient Care Team: Omie Bickers, MD as PCP - General (Internal Medicine) Riley Cheadle Windsor Hatcher, MD as Consulting Physician (Gastroenterology)   ASSESSMENT & PLAN:   Assessment: 1.  Leukopenia and thrombocytopenia: - Patient seen at the request of Dr. Quentin Brunner office. - CBC on 02/15/2021 with white count 2.4, ANC 1.3, platelet count 84.  Hemoglobin was normal at 15.1.  There was question of aggregation of platelets. - She has mildly decreased platelet count since 2016. - White count is decreased since 2018. - No B symptoms or recurrent infections. 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 - MRI of the abdomen on 09/12/2019 with hepatic steatosis, cirrhosis with stigmata of portal venous hypertension including borderline splenomegaly and varices.  2.  Social/family history: - She lives at home with her husband. - She smoked 1 and half pack per day for 40 years and quit in 2012. - She is currently working at [employer] daily.  She worked for a [employer] prior to that.  No history of chemical exposure. - No family history of low platelets.  Mother died of lung cancer.  Maternal aunt died of cancer.   3.  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 - Evaluated by transplant team on 10/11/2021 not a candidate for transplant. - Microwave ablation on 11/03/2021 by Dr. Julietta Ogren.  Plan: 1.  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  Spleen size was normal on previous imaging.  Previous nutritional deficiency workup was negative. - Reviewed labs from 10/24/2022.  Platelet count is stable at 95, WBC at 2.2.  ANC is 1.1.  No recurrent infections.  Physical exam today did not reveal any palpable adenopathy or splenomegaly. - Recommend follow-up in 6 months with repeat labs.  2.  Well to moderately  differentiated HCC: - AFP is normal at 6.0.  Last MRI was on 05/31/2022 which showed stable lesions. - She has an MRI scheduled next month and follow-up with Dr. Julietta Ogren.  No orders of the defined types were placed in this encounter.     Raven Lee,acting as a Neurosurgeon for Paulett Boros, MD.,have documented all relevant documentation on the behalf of Paulett Boros, MD,as directed by  Paulett Boros, MD while in the presence of Paulett Boros, MD.  ***   [employer] R Lee   5/12/20259:16 AM  CHIEF COMPLAINT:   Diagnosis: leukopenia and thrombocytopenia     Cancer Staging  No matching staging information was found for the patient.    Prior Therapy: none  Current Therapy:  surveillance    HISTORY OF PRESENT ILLNESS:   Oncology History   No history exists.     INTERVAL HISTORY:   Raven Lee is a 73 y.o. female presenting to clinic today for follow up of liver cancer, leukopenia, and thrombocytopenia. She was last seen by me on 10/26/22.  Today, she states that she is doing well overall. Her appetite level is at ***%. Her energy level is at ***%.   PAST MEDICAL HISTORY:   Past Medical History: Past Medical History:  Diagnosis Date   Anxiety    Difficulty sleeping    Emphysema/COPD (HCC)    Hepatitis    HEPATITIS C   History of panic attacks    Hypertension    Liver lesion    Pneumonia  Psoriasis     Surgical History: Past Surgical History:  Procedure Laterality Date   ABDOMINAL HYSTERECTOMY     COLONOSCOPY  2009   [facility]: diverticulosis   IR RADIOLOGIST EVAL & MGMT  09/01/2021   IR RADIOLOGIST EVAL & MGMT  02/08/2022   IR RADIOLOGIST EVAL & MGMT  06/07/2022   LAPAROSCOPIC GASTRIC SLEEVE RESECTION N/A 12/01/2014   Procedure: LAPAROSCOPIC GASTRIC SLEEVE RESECTION UPPER ENDO;  Surgeon: Ayesha Lente, MD;  Location: [facility];  Service: General;  Laterality: N/A;   LUNG LOBECTOMY  03/2011   LLL-severe PNA   RADIOFREQUENCY ABLATION N/A  10/13/2021   Procedure: CT MICROWAVE ABLATION;  Surgeon: Elene Griffes, MD;  Location: [facility];  Service: Anesthesiology;  Laterality: N/A;   RADIOFREQUENCY ABLATION N/A 11/03/2021   Procedure: CT MICROWAVE ABLATION;  Surgeon: Elene Griffes, MD;  Location: [facility];  Service: Anesthesiology;  Laterality: N/A;   WISDOM TOOTH EXTRACTION      Social History: Social History   Socioeconomic History   Marital status: Married    Spouse name: Raven Lee   Number of children: Not on file   Years of education: 12   Highest education level: 12th grade  Occupational History   Occupation: Retired    Associate Professor: [employer]  Tobacco Use   Smoking status: Former    Current packs/day: 0.00    Average packs/day: 1 pack/day for 25.0 years (25.0 ttl pk-yrs)    Types: Cigarettes    Start date: 03/29/1986    Quit date: 03/30/2011    Years since quitting: 12.7    Passive exposure: Past   Smokeless tobacco: Never  Vaping Use   Vaping status: Never Used  Substance and Sexual Activity   Alcohol use: Yes    Alcohol/week: 0.0 standard drinks of alcohol    Comment: socially   Drug use: No   Sexual activity: Not Currently    Partners: Male    Birth control/protection: Post-menopausal  Other Topics Concern   Not on file  Social History Narrative   Not on file   Social Drivers of Health   Financial Resource Strain: Low Risk  (04/21/2022)   Overall Financial Resource Strain (CARDIA)    Difficulty of Paying Living Expenses: Not hard at all  Food Insecurity: No Food Insecurity (04/21/2022)   Hunger Vital Sign    Worried About Running Out of Food in the Last Year: Never true    Ran Out of Food in the Last Year: Never true  Transportation Needs: No Transportation Needs (04/21/2022)   PRAPARE - Administrator, Civil Service (Medical): No    Lack of Transportation (Non-Medical): No  Physical Activity: Inactive (04/21/2022)   Exercise Vital Sign    Days of Exercise per Week: 0 days    Minutes of Exercise  per Session: 0 min  Stress: No Stress Concern Present (04/21/2022)   [employer] of Occupational Health - Occupational Stress Questionnaire    Feeling of Stress : Only a little  Social Connections: Moderately Integrated (04/21/2022)   Social Connection and Isolation Panel [NHANES]    Frequency of Communication with Friends and Family: More than three times a week    Frequency of Social Gatherings with Friends and Family: More than three times a week    Attends Religious Services: Never    Database administrator or Organizations: Yes    Attends Engineer, structural: More than 4 times per year    Marital Status: Married  Catering manager  Violence: Not At Risk (04/21/2022)   Humiliation, Afraid, Rape, and Kick questionnaire    Fear of Current or Ex-Partner: No    Emotionally Abused: No    Physically Abused: No    Sexually Abused: No    Family History: Family History  Problem Relation Age of Onset   Lung cancer Mother        smoked   Heart disease Father    Heart attack Brother    Diabetes Brother    Stroke Brother    Obesity Other    Colon cancer Neg Hx    Liver disease Neg Hx     Current Medications:  Current Outpatient Medications:    hydrochlorothiazide  (HYDRODIURIL ) 25 MG tablet, Take 25 mg by mouth every evening. , Disp: , Rfl:    HYDROcodone -acetaminophen  (NORCO) 5-325 MG tablet, Take 1 tablet by mouth every 4 (four) hours as needed for up to 10 doses for moderate pain or severe pain., Disp: 10 tablet, Rfl: 0   metoprolol  (LOPRESSOR ) 50 MG tablet, Take 50 mg by mouth daily., Disp: , Rfl:    mirtazapine (REMERON) 45 MG tablet, Take 45 mg by mouth at bedtime., Disp: , Rfl:    nicotine polacrilex (COMMIT) 2 MG lozenge, Take 2 mg by mouth as needed (stress)., Disp: , Rfl:    nystatin -triamcinolone  ointment (MYCOLOG), Apply 1 application topically 2 (two) times daily., Disp: 30 g, Rfl: 11   omeprazole (PRILOSEC) 20 MG capsule, Take 20 mg by mouth daily., Disp: ,  Rfl:    quinapril  (ACCUPRIL ) 40 MG tablet, Take 40 mg by mouth every evening. , Disp: , Rfl:    venlafaxine  XR (EFFEXOR -XR) 150 MG 24 hr capsule, Take 150 mg by mouth every evening. , Disp: , Rfl:    VRAYLAR 1.5 MG capsule, Take 1.5 mg by mouth daily., Disp: , Rfl:    Allergies: Allergies  Allergen Reactions   Oxycodone Other (See Comments)    Hallucinations    REVIEW OF SYSTEMS:   Review of Systems  Constitutional:  Negative for chills, fatigue and fever.  HENT:   Negative for lump/mass, mouth sores, nosebleeds, sore throat and trouble swallowing.   Eyes:  Negative for eye problems.  Respiratory:  Negative for cough and shortness of breath.   Cardiovascular:  Negative for chest pain, leg swelling and palpitations.  Gastrointestinal:  Negative for abdominal pain, constipation, diarrhea, nausea and vomiting.  Genitourinary:  Negative for bladder incontinence, difficulty urinating, dysuria, frequency, hematuria and nocturia.   Musculoskeletal:  Negative for arthralgias, back pain, flank pain, myalgias and neck pain.  Skin:  Negative for itching and rash.  Neurological:  Negative for dizziness, headaches and numbness.  Hematological:  Does not bruise/bleed easily.  Psychiatric/Behavioral:  Negative for depression, sleep disturbance and suicidal ideas. The patient is not nervous/anxious.   All other systems reviewed and are negative.    VITALS:   There were no vitals taken for this visit.  Wt Readings from Last 3 Encounters:  07/03/23 160 lb 12.8 oz (72.9 kg)  10/26/22 169 lb 4.8 oz (76.8 kg)  04/18/22 159 lb 11.2 oz (72.4 kg)    There is no height or weight on file to calculate BMI.  Performance status (ECOG): 1 - Symptomatic but completely ambulatory  PHYSICAL EXAM:   Physical Exam Vitals and nursing note reviewed. Exam conducted with a chaperone present.  Constitutional:      Appearance: Normal appearance.  Cardiovascular:     Rate and Rhythm: Normal rate and regular  rhythm.     Pulses: Normal pulses.     Heart sounds: Normal heart sounds.  Pulmonary:     Effort: Pulmonary effort is normal.     Breath sounds: Normal breath sounds.  Abdominal:     Palpations: Abdomen is soft. There is no hepatomegaly, splenomegaly or mass.     Tenderness: There is no abdominal tenderness.  Musculoskeletal:     Right lower leg: No edema.     Left lower leg: No edema.  Lymphadenopathy:     Cervical: No cervical adenopathy.     Right cervical: No superficial, deep or posterior cervical adenopathy.    Left cervical: No superficial, deep or posterior cervical adenopathy.     Upper Body:     Right upper body: No supraclavicular or axillary adenopathy.     Left upper body: No supraclavicular or axillary adenopathy.  Neurological:     General: No focal deficit present.     Mental Status: She is alert and oriented to person, place, and time.  Psychiatric:        Mood and Affect: Mood normal.        Behavior: Behavior normal.     LABS:      Latest Ref Rng & Units 01/01/2024    1:20 PM 07/03/2023    3:30 PM 04/27/2023    9:53 AM  CBC  WBC 4.0 - 10.5 K/uL 1.6  2.0  2.5   Hemoglobin 12.0 - 15.0 g/dL 16.1  09.6  04.5   Hematocrit 36.0 - 46.0 % 42.0  43.6  41.1   Platelets 150 - 400 K/uL 81  100  101       Latest Ref Rng & Units 01/01/2024    1:20 PM 04/27/2023    9:53 AM 10/24/2022   11:06 AM  CMP  Glucose 70 - 99 mg/dL 99  409  811   BUN 8 - 23 mg/dL 9  14  12    Creatinine 0.44 - 1.00 mg/dL 9.14  7.82  9.56   Sodium 135 - 145 mmol/L 137  138  141   Potassium 3.5 - 5.1 mmol/L 3.6  3.3  3.7   Chloride 98 - 111 mmol/L 97  102  102   CO2 22 - 32 mmol/L 28  28  28    Calcium 8.9 - 10.3 mg/dL 9.4  8.9  8.6   Total Protein 6.5 - 8.1 g/dL 8.1  7.5  7.9   Total Bilirubin 0.0 - 1.2 mg/dL 1.9  1.5  1.4   Alkaline Phos 38 - 126 U/L 73  86  95   AST 15 - 41 U/L 61  47  50   ALT 0 - 44 U/L 25  21  21     Component     Latest Ref Rng 11/16/2021 03/23/2022 10/24/2022  AFP,  Serum, Tumor Marker     0.0 - 9.2 ng/mL 9.3 (H)  6.8  6.0      No results found for: "CEA1", "CEA" / No results found for: "CEA1", "CEA" No results found for: "PSA1" No results found for: "OZH086" No results found for: "CAN125"  No results found for: "TOTALPROTELP", "ALBUMINELP", "A1GS", "A2GS", "BETS", "BETA2SER", "GAMS", "MSPIKE", "SPEI" Lab Results  Component Value Date   TIBC 306 11/13/2017   TIBC 326 05/11/2017   FERRITIN 131 11/13/2017   FERRITIN 112 05/11/2017   IRONPCTSAT 73 (H) 11/13/2017   IRONPCTSAT 47 05/11/2017   Lab Results  Component Value Date   LDH 247 (  H) 03/23/2022   LDH 256 (H) 05/28/2021     STUDIES:   No results found.

## 2024-01-09 ENCOUNTER — Inpatient Hospital Stay: Admitting: Hematology

## 2024-01-11 ENCOUNTER — Inpatient Hospital Stay: Admitting: Hematology

## 2024-01-11 NOTE — Progress Notes (Incomplete)
 [facility] [address], [place] [address]    Clinic Day:  01/11/2024  Referring physician: Omie Bickers, MD  Patient Care Team: Omie Bickers, MD as PCP - General (Internal Medicine) Riley Cheadle Windsor Hatcher, MD as Consulting Physician (Gastroenterology)   ASSESSMENT & PLAN:   Assessment: 1.  Leukopenia and thrombocytopenia: - Patient seen at the request of Dr. Quentin Brunner office. - CBC on 02/15/2021 with white count 2.4, ANC 1.3, platelet count 84.  Hemoglobin was normal at 15.1.  There was question of aggregation of platelets. - She has mildly decreased platelet count since 2016. - White count is decreased since 2018. - No B symptoms or recurrent infections. 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 - MRI of the abdomen on 09/12/2019 with hepatic steatosis, cirrhosis with stigmata of portal venous hypertension including borderline splenomegaly and varices.  2.  Social/family history: - She lives at home with her husband. - She smoked 1 and half pack per day for 40 years and quit in 2012. - She is currently working at [employer] daily.  She worked for a [employer] prior to that.  No history of chemical exposure. - No family history of low platelets.  Mother died of lung cancer.  Maternal aunt died of cancer.   3.  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 - Evaluated by transplant team on 10/11/2021 not a candidate for transplant. - Microwave ablation on 11/03/2021 by Dr. Julietta Ogren.  Plan: 1.  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  Spleen size was normal on previous imaging.  Previous nutritional deficiency workup was negative. - Reviewed labs from 10/24/2022.  Platelet count is stable at 95, WBC at 2.2.  ANC is 1.1.  No recurrent infections.  Physical exam today did not reveal any palpable adenopathy or splenomegaly. - Recommend follow-up in 6 months with repeat labs.  2.  Well to moderately  differentiated HCC: - AFP is normal at 6.0.  Last MRI was on 05/31/2022 which showed stable lesions. - She has an MRI scheduled next month and follow-up with Dr. Julietta Ogren.  No orders of the defined types were placed in this encounter.    Raven Lee,acting as a Neurosurgeon for Paulett Boros, MD.,have documented all relevant documentation on the behalf of Paulett Boros, MD,as directed by  Paulett Boros, MD while in the presence of Paulett Boros, MD.  ***   [place] R Lee   5/15/20259:13 AM  CHIEF COMPLAINT:   Diagnosis: leukopenia and thrombocytopenia     Cancer Staging  No matching staging information was found for the patient.    Prior Therapy: none  Current Therapy:  surveillance    HISTORY OF PRESENT ILLNESS:   Oncology History   No history exists.     INTERVAL HISTORY:   Raven Lee is a 73 y.o. female presenting to clinic today for follow up of liver cancer, leukopenia, and thrombocytopenia. She was last seen by me on 10/26/22.  Today, she states that she is doing well overall. Her appetite level is at ***%. Her energy level is at ***%.   PAST MEDICAL HISTORY:   Past Medical History: Past Medical History:  Diagnosis Date   Anxiety    Difficulty sleeping    Emphysema/COPD (HCC)    Hepatitis    HEPATITIS C   History of panic attacks    Hypertension    Liver lesion    Pneumonia  Psoriasis     Surgical History: Past Surgical History:  Procedure Laterality Date   ABDOMINAL HYSTERECTOMY     COLONOSCOPY  2009   [facility]: diverticulosis   IR RADIOLOGIST EVAL & MGMT  09/01/2021   IR RADIOLOGIST EVAL & MGMT  02/08/2022   IR RADIOLOGIST EVAL & MGMT  06/07/2022   LAPAROSCOPIC GASTRIC SLEEVE RESECTION N/A 12/01/2014   Procedure: LAPAROSCOPIC GASTRIC SLEEVE RESECTION UPPER ENDO;  Surgeon: Ayesha Lente, MD;  Location: [facility];  Service: General;  Laterality: N/A;   LUNG LOBECTOMY  03/2011   LLL-severe PNA   RADIOFREQUENCY ABLATION N/A  10/13/2021   Procedure: CT MICROWAVE ABLATION;  Surgeon: Elene Griffes, MD;  Location: [facility];  Service: Anesthesiology;  Laterality: N/A;   RADIOFREQUENCY ABLATION N/A 11/03/2021   Procedure: CT MICROWAVE ABLATION;  Surgeon: Elene Griffes, MD;  Location: [facility];  Service: Anesthesiology;  Laterality: N/A;   WISDOM TOOTH EXTRACTION      Social History: Social History   Socioeconomic History   Marital status: Married    Spouse name: Nery Muzny   Number of children: Not on file   Years of education: 12   Highest education level: 12th grade  Occupational History   Occupation: Retired    Associate Professor: [employer]  Tobacco Use   Smoking status: Former    Current packs/day: 0.00    Average packs/day: 1 pack/day for 25.0 years (25.0 ttl pk-yrs)    Types: Cigarettes    Start date: 03/29/1986    Quit date: 03/30/2011    Years since quitting: 12.7    Passive exposure: Past   Smokeless tobacco: Never  Vaping Use   Vaping status: Never Used  Substance and Sexual Activity   Alcohol use: Yes    Alcohol/week: 0.0 standard drinks of alcohol    Comment: socially   Drug use: No   Sexual activity: Not Currently    Partners: Male    Birth control/protection: Post-menopausal  Other Topics Concern   Not on file  Social History Narrative   Not on file   Social Drivers of Health   Financial Resource Strain: Low Risk  (04/21/2022)   Overall Financial Resource Strain (CARDIA)    Difficulty of Paying Living Expenses: Not hard at all  Food Insecurity: No Food Insecurity (04/21/2022)   Hunger Vital Sign    Worried About Running Out of Food in the Last Year: Never true    Ran Out of Food in the Last Year: Never true  Transportation Needs: No Transportation Needs (04/21/2022)   PRAPARE - Administrator, Civil Service (Medical): No    Lack of Transportation (Non-Medical): No  Physical Activity: Inactive (04/21/2022)   Exercise Vital Sign    Days of Exercise per Week: 0 days    Minutes of Exercise  per Session: 0 min  Stress: No Stress Concern Present (04/21/2022)   [employer] of Occupational Health - Occupational Stress Questionnaire    Feeling of Stress : Only a little  Social Connections: Moderately Integrated (04/21/2022)   Social Connection and Isolation Panel [NHANES]    Frequency of Communication with Friends and Family: More than three times a week    Frequency of Social Gatherings with Friends and Family: More than three times a week    Attends Religious Services: Never    Database administrator or Organizations: Yes    Attends Engineer, structural: More than 4 times per year    Marital Status: Married  Catering manager  Violence: Not At Risk (04/21/2022)   Humiliation, Afraid, Rape, and Kick questionnaire    Fear of Current or Ex-Partner: No    Emotionally Abused: No    Physically Abused: No    Sexually Abused: No    Family History: Family History  Problem Relation Age of Onset   Lung cancer Mother        smoked   Heart disease Father    Heart attack Brother    Diabetes Brother    Stroke Brother    Obesity Other    Colon cancer Neg Hx    Liver disease Neg Hx     Current Medications:  Current Outpatient Medications:    hydrochlorothiazide  (HYDRODIURIL ) 25 MG tablet, Take 25 mg by mouth every evening. , Disp: , Rfl:    HYDROcodone -acetaminophen  (NORCO) 5-325 MG tablet, Take 1 tablet by mouth every 4 (four) hours as needed for up to 10 doses for moderate pain or severe pain., Disp: 10 tablet, Rfl: 0   metoprolol  (LOPRESSOR ) 50 MG tablet, Take 50 mg by mouth daily., Disp: , Rfl:    mirtazapine (REMERON) 45 MG tablet, Take 45 mg by mouth at bedtime., Disp: , Rfl:    nicotine polacrilex (COMMIT) 2 MG lozenge, Take 2 mg by mouth as needed (stress)., Disp: , Rfl:    nystatin -triamcinolone  ointment (MYCOLOG), Apply 1 application topically 2 (two) times daily., Disp: 30 g, Rfl: 11   omeprazole (PRILOSEC) 20 MG capsule, Take 20 mg by mouth daily., Disp: ,  Rfl:    quinapril  (ACCUPRIL ) 40 MG tablet, Take 40 mg by mouth every evening. , Disp: , Rfl:    venlafaxine  XR (EFFEXOR -XR) 150 MG 24 hr capsule, Take 150 mg by mouth every evening. , Disp: , Rfl:    VRAYLAR 1.5 MG capsule, Take 1.5 mg by mouth daily., Disp: , Rfl:    Allergies: Allergies  Allergen Reactions   Oxycodone Other (See Comments)    Hallucinations    REVIEW OF SYSTEMS:   Review of Systems  Constitutional:  Negative for chills, fatigue and fever.  HENT:   Negative for lump/mass, mouth sores, nosebleeds, sore throat and trouble swallowing.   Eyes:  Negative for eye problems.  Respiratory:  Negative for cough and shortness of breath.   Cardiovascular:  Negative for chest pain, leg swelling and palpitations.  Gastrointestinal:  Negative for abdominal pain, constipation, diarrhea, nausea and vomiting.  Genitourinary:  Negative for bladder incontinence, difficulty urinating, dysuria, frequency, hematuria and nocturia.   Musculoskeletal:  Negative for arthralgias, back pain, flank pain, myalgias and neck pain.  Skin:  Negative for itching and rash.  Neurological:  Negative for dizziness, headaches and numbness.  Hematological:  Does not bruise/bleed easily.  Psychiatric/Behavioral:  Negative for depression, sleep disturbance and suicidal ideas. The patient is not nervous/anxious.   All other systems reviewed and are negative.    VITALS:   There were no vitals taken for this visit.  Wt Readings from Last 3 Encounters:  07/03/23 160 lb 12.8 oz (72.9 kg)  10/26/22 169 lb 4.8 oz (76.8 kg)  04/18/22 159 lb 11.2 oz (72.4 kg)    There is no height or weight on file to calculate BMI.  Performance status (ECOG): 1 - Symptomatic but completely ambulatory  PHYSICAL EXAM:   Physical Exam Vitals and nursing note reviewed. Exam conducted with a chaperone present.  Constitutional:      Appearance: Normal appearance.  Cardiovascular:     Rate and Rhythm: Normal rate and regular  rhythm.     Pulses: Normal pulses.     Heart sounds: Normal heart sounds.  Pulmonary:     Effort: Pulmonary effort is normal.     Breath sounds: Normal breath sounds.  Abdominal:     Palpations: Abdomen is soft. There is no hepatomegaly, splenomegaly or mass.     Tenderness: There is no abdominal tenderness.  Musculoskeletal:     Right lower leg: No edema.     Left lower leg: No edema.  Lymphadenopathy:     Cervical: No cervical adenopathy.     Right cervical: No superficial, deep or posterior cervical adenopathy.    Left cervical: No superficial, deep or posterior cervical adenopathy.     Upper Body:     Right upper body: No supraclavicular or axillary adenopathy.     Left upper body: No supraclavicular or axillary adenopathy.  Neurological:     General: No focal deficit present.     Mental Status: She is alert and oriented to person, place, and time.  Psychiatric:        Mood and Affect: Mood normal.        Behavior: Behavior normal.     LABS:      Latest Ref Rng & Units 01/01/2024    1:20 PM 07/03/2023    3:30 PM 04/27/2023    9:53 AM  CBC  WBC 4.0 - 10.5 K/uL 1.6  2.0  2.5   Hemoglobin 12.0 - 15.0 g/dL 13.0  86.5  78.4   Hematocrit 36.0 - 46.0 % 42.0  43.6  41.1   Platelets 150 - 400 K/uL 81  100  101       Latest Ref Rng & Units 01/01/2024    1:20 PM 04/27/2023    9:53 AM 10/24/2022   11:06 AM  CMP  Glucose 70 - 99 mg/dL 99  696  295   BUN 8 - 23 mg/dL 9  14  12    Creatinine 0.44 - 1.00 mg/dL 2.84  1.32  4.40   Sodium 135 - 145 mmol/L 137  138  141   Potassium 3.5 - 5.1 mmol/L 3.6  3.3  3.7   Chloride 98 - 111 mmol/L 97  102  102   CO2 22 - 32 mmol/L 28  28  28    Calcium 8.9 - 10.3 mg/dL 9.4  8.9  8.6   Total Protein 6.5 - 8.1 g/dL 8.1  7.5  7.9   Total Bilirubin 0.0 - 1.2 mg/dL 1.9  1.5  1.4   Alkaline Phos 38 - 126 U/L 73  86  95   AST 15 - 41 U/L 61  47  50   ALT 0 - 44 U/L 25  21  21     Component     Latest Ref Rng 11/16/2021 03/23/2022 10/24/2022  AFP,  Serum, Tumor Marker     0.0 - 9.2 ng/mL 9.3 (H)  6.8  6.0      No results found for: "CEA1", "CEA" / No results found for: "CEA1", "CEA" No results found for: "PSA1" No results found for: "NUU725" No results found for: "CAN125"  No results found for: "TOTALPROTELP", "ALBUMINELP", "A1GS", "A2GS", "BETS", "BETA2SER", "GAMS", "MSPIKE", "SPEI" Lab Results  Component Value Date   TIBC 306 11/13/2017   TIBC 326 05/11/2017   FERRITIN 131 11/13/2017   FERRITIN 112 05/11/2017   IRONPCTSAT 73 (H) 11/13/2017   IRONPCTSAT 47 05/11/2017   Lab Results  Component Value Date   LDH 247 (  H) 03/23/2022   LDH 256 (H) 05/28/2021     STUDIES:   MR ABDOMEN WWO CONTRAST Result Date: 01/09/2024 CLINICAL DATA:  Has are carcinoma. Status post microwave ablation. Follow-up. AP 5.9 on 01/01/2024, similar to older measurements EXAM: MRI ABDOMEN WITHOUT AND WITH CONTRAST TECHNIQUE: Multiplanar multisequence MR imaging of the abdomen was performed both before and after the administration of intravenous contrast. CONTRAST:  7mL GADAVIST  GADOBUTROL  1 MMOL/ML IV SOLN COMPARISON:  11/16/2022 and older. FINDINGS: Lower chest: No pleural effusion along the lung bases. Slight elevation left hemidiaphragm. Hepatobiliary: Nodular heterogeneous liver identified. There are areas of lattice like enhancement on delayed imaging consistent with diffuse areas of fibrosis. There is an area of relative sparing of changes involving segment 7/8 liver towards the dome. This area is more dark on T2 relative to background liver and has more fatty sparing on out of phase imaging well liver elsewhere has significant signal dropout consistent with a component of fatty infiltration. Again this could represent a regenerative nodule. Peripherally peripherally in the right hepatic lobe, segment 5/6 on the prior examination was an area of capsular retraction with focal heterogeneous low T1 signal lesion. Areas more isointense on T2 precontrast with  some heterogeneous components. This would be consistent with the ablation defect previously described. On the prior this measured 2.3 x 1.0 cm and today when measured in a similar fashion 2.3 by 1.1 cm. Series 15, image 55. On the prior examination in the dome in segment 7 was of small hypervascular area measuring 10 x 7 mm. Today this is seen once again best on series 13, image 32 measuring 8 by 6 mm. This area does not show any bright T2 signal and does not show relative washout. The similar lesion in segment 4A on the prior is not as well appreciated but may be smaller measuring 5 mm on series 13, image 32. Again no corroborative abnormal T2 signal, restricted diffusion or relative washout on the dynamic examination compared to background liver. No new areas of aggressive hyperenhancement liver. Patent portal vein. There are prominent varices and are identified including along the lower esophagus, upper stomach. No biliary ductal dilatation. Gallbladder is nondilated. Possible low insertion of a cystic duct along the posterior right side of the mid common duct. Please correlate with history. Pancreas: Preserved pancreatic T1 signal. No mass lesion or ductal dilatation. There is suggestion of a pancreatic divisum, normal variant. Spleen: Spleen is enlarged. Cephalocaudal length 14.2 cm. Previously more normal in size 11.7 cm. Preserved enhancement and precontrast signal. Small splenule inferiorly. Adrenals/Urinary Tract: Adrenal glands are preserved. No enhancing renal mass or collecting system dilatation. Stomach/Bowel: Stomach is nondilated. Visualized loops of small large bowel are nondilated. Normal retrocecal appendix. Vascular/Lymphatic: Normal caliber aorta and IVC. Atherosclerotic changes along the aorta. No discrete abnormal lymph node enlargement identified in the abdomen. Other:  At most trace.  Paddock ascites. Musculoskeletal: Scattered degenerative changes along the spine. IMPRESSION: Chronic liver  disease identified with a nodular heterogeneous liver. Associated areas of interstitial fibrosis and parenchymal distortion. Increasing splenic size now approaching 14.2 cm. Left-sided varices including along the esophagus and upper stomach. Trace ascites. Patent main portal vein with diameter at the liver hilum approaching 11 mm. Stable appearance of ablation defect peripherally in the inferior right hepatic lobe. No recurrent hyperenhancement along the procedures site. Subtle areas of enhancement in the arterial phase in segment 4 and 7 are stable from previous and do not have more aggressive features such as washout or  T2 signal. No restricted diffusion. Liver rads category 3. No new hypervascular lesions identified at this time. Elevation left hemidiaphragm. Electronically Signed   By: Adrianna Horde M.D.   On: 01/09/2024 17:45

## 2024-01-12 ENCOUNTER — Other Ambulatory Visit (HOSPITAL_COMMUNITY)

## 2024-01-12 ENCOUNTER — Ambulatory Visit (HOSPITAL_COMMUNITY)

## 2024-01-18 ENCOUNTER — Ambulatory Visit (HOSPITAL_COMMUNITY)

## 2024-01-18 ENCOUNTER — Other Ambulatory Visit (HOSPITAL_COMMUNITY)

## 2024-01-23 ENCOUNTER — Encounter: Payer: Self-pay | Admitting: Gastroenterology

## 2024-01-25 ENCOUNTER — Inpatient Hospital Stay (HOSPITAL_BASED_OUTPATIENT_CLINIC_OR_DEPARTMENT_OTHER): Admitting: Hematology

## 2024-01-25 VITALS — BP 139/84 | HR 102 | Temp 97.9°F | Resp 19 | Ht 61.5 in | Wt 140.0 lb

## 2024-01-25 DIAGNOSIS — D696 Thrombocytopenia, unspecified: Secondary | ICD-10-CM

## 2024-01-25 DIAGNOSIS — D72819 Decreased white blood cell count, unspecified: Secondary | ICD-10-CM

## 2024-01-25 DIAGNOSIS — C220 Liver cell carcinoma: Secondary | ICD-10-CM

## 2024-01-25 DIAGNOSIS — Z8505 Personal history of malignant neoplasm of liver: Secondary | ICD-10-CM | POA: Diagnosis not present

## 2024-01-25 DIAGNOSIS — Z08 Encounter for follow-up examination after completed treatment for malignant neoplasm: Secondary | ICD-10-CM | POA: Diagnosis not present

## 2024-01-25 IMAGING — MR MR ABDOMEN WO/W CM
20 series · 48 of 48 positions shown · IV contrast (gadavist)
Comparison: MRI of 08/13/2021. The CT biopsy images of 10/13/2021
are also reviewed.

CLINICAL DATA: Recent diagnosis of hepatocellular carcinoma.
Diagnosed at the site of a capsular based lesion in the right
hepatic lobe.

EXAM:
MRI ABDOMEN WITHOUT AND WITH CONTRAST
TECHNIQUE: Multiplanar multisequence MR imaging of the abdomen was performed
both before and after the administration of intravenous contrast.
CONTRAST:  7mL GADAVIST GADOBUTROL 1 MMOL/ML IV SOLN

[Series 3: cor haste · coronal · 6.0mm · 1.25mm/px · 2 of 26 slices shown]
[im 1/26]
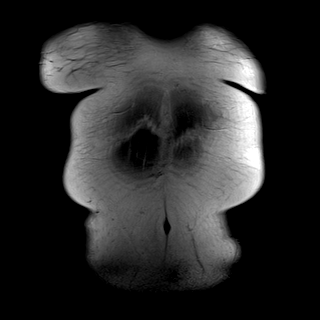
[im 26/26]
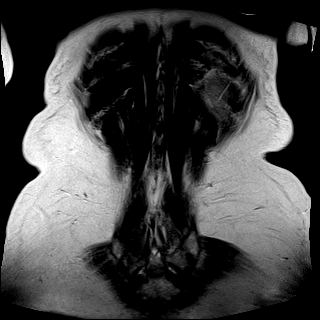

[Series 6: T2 fat-sat · axial · 6.0mm · 1.19mm/px · z∈[-108,+101]mm · 2 of 30 slices shown]
[im 1/30]
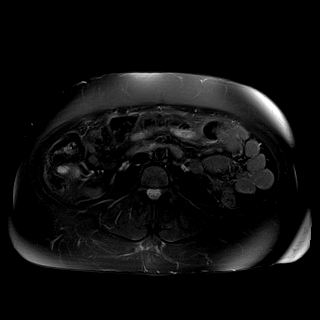
[im 30/30]
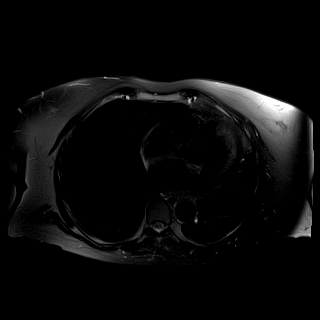

[Series 7: DWI · axial · 6.0mm · 1.42mm/px · z∈[-108,+101]mm · 2 of 30 slices shown (1 of 4)]
[im 1/30]
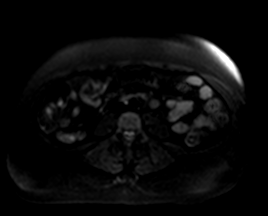
[im 30/30]
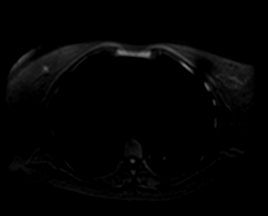

[Series 7: DWI · axial · 6.0mm · 1.42mm/px · 1 of 30 slices shown (2 of 4)]
[im 1/30]
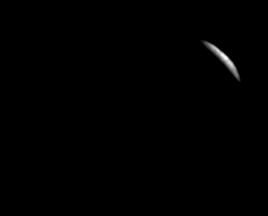

[Series 7: DWI · axial · 6.0mm · 1.42mm/px · 1 of 30 slices shown (3 of 4)]
[im 1/30]
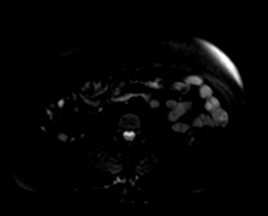

[Series 8: DWI · axial · 6.0mm · 1.42mm/px · 1 of 30 slices shown (4 of 4)]
[im 1/30]
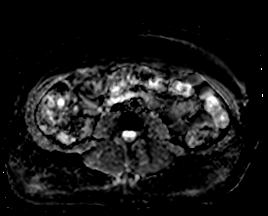

[Series 9: ax in and · axial · 3.0mm · 1.19mm/px · z∈[-123,+90]mm · 3 of 72 slices shown (1 of 2)]
[im 1/72]
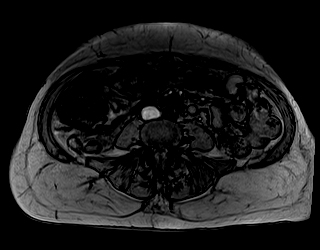
[im 36/72]
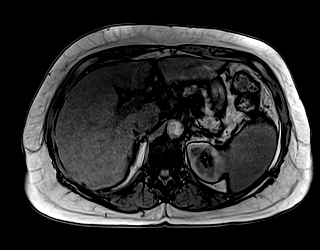
[im 72/72]
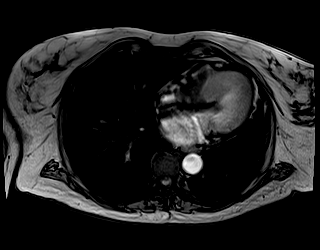

[Series 10: ax in and · axial · 3.0mm · 1.19mm/px · z∈[-123,+90]mm · 3 of 72 slices shown (2 of 2)]
[im 1/72]
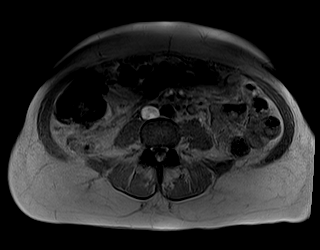
[im 36/72]
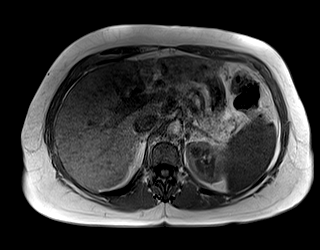
[im 72/72]
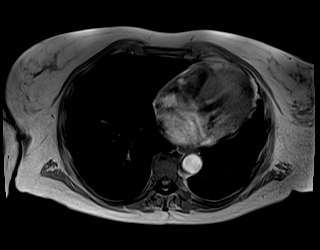

[Series 11: ax haste bh · axial · 6.0mm · 1.19mm/px · 1 of 30 slices shown]
[im 1/30]
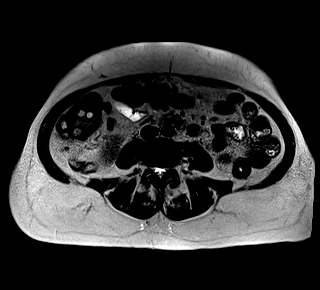

[Series 12: bSSFP · axial · 6.0mm · 0.74mm/px · z∈[-144,+138]mm · 2 of 48 slices shown]
[im 1/48]
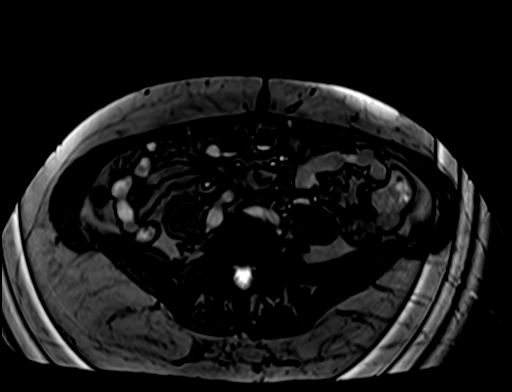
[im 48/48]
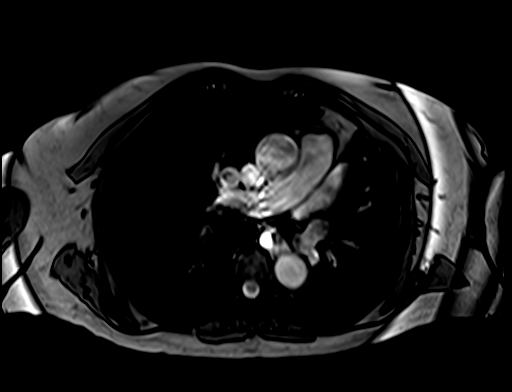

[Series 13: T1 dynamic · axial · 3.0mm · 1.19mm/px · z∈[-135,+102]mm · 3 of 80 slices shown (1 of 9)]
[im 1/80]
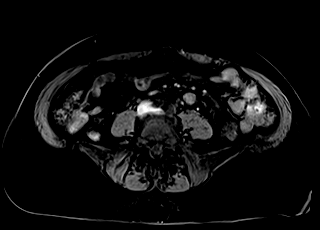
[im 40/80]
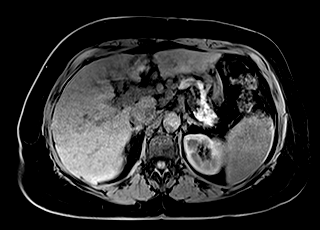
[im 80/80]
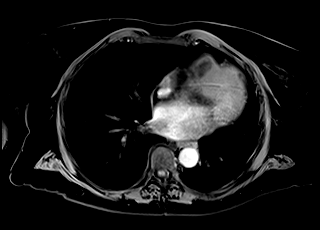

[Series 15: T1 dynamic · axial · 3.0mm · 1.19mm/px · z∈[-135,+102]mm · 3 of 80 slices shown (2 of 9)]
[im 1/80]
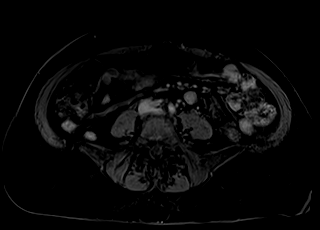
[im 40/80]
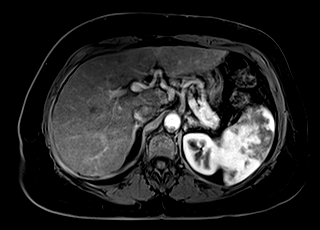
[im 80/80]
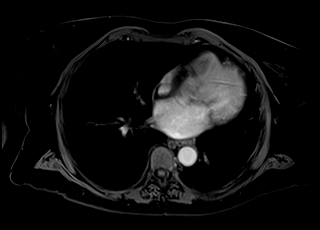

[Series 16: T1 dynamic · axial · 3.0mm · 1.19mm/px · z∈[-135,+102]mm · 3 of 80 slices shown (3 of 9)]
[im 1/80]
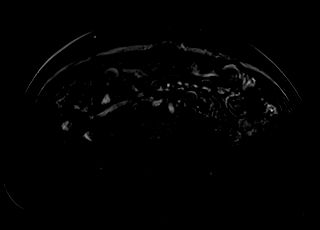
[im 40/80]
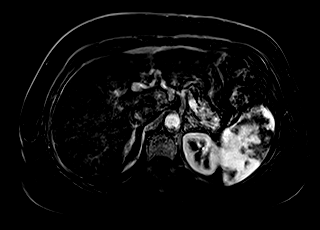
[im 80/80]
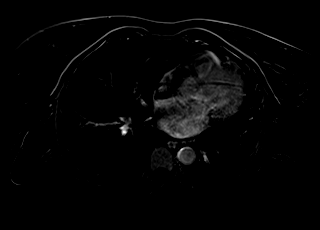

[Series 17: T1 dynamic · axial · 3.0mm · 1.19mm/px · z∈[-135,+102]mm · 3 of 80 slices shown (4 of 9)]
[im 1/80]
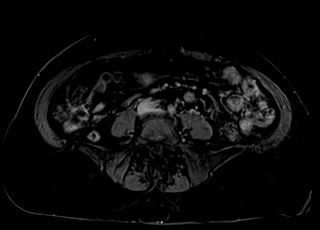
[im 40/80]
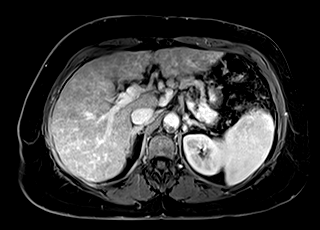
[im 80/80]
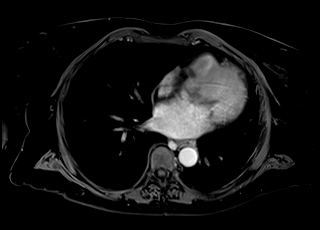

[Series 18: T1 dynamic · axial · 3.0mm · 1.19mm/px · z∈[-135,+102]mm · 3 of 80 slices shown (5 of 9)]
[im 1/80]
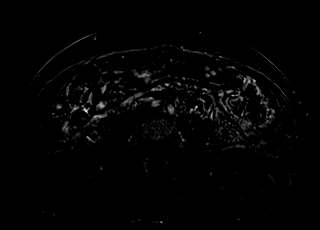
[im 40/80]
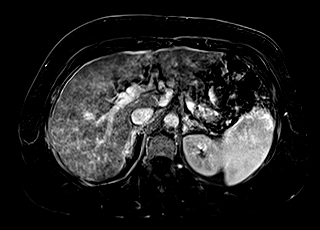
[im 80/80]
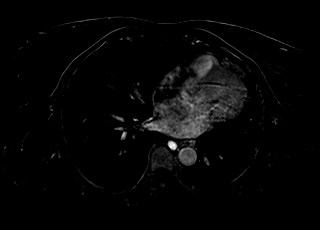

[Series 19: T1 dynamic · axial · 3.0mm · 1.19mm/px · z∈[-135,+102]mm · 3 of 80 slices shown (6 of 9)]
[im 1/80]
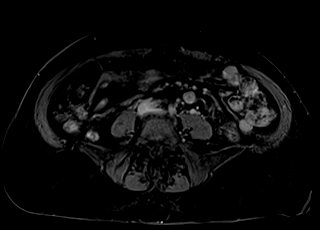
[im 40/80]
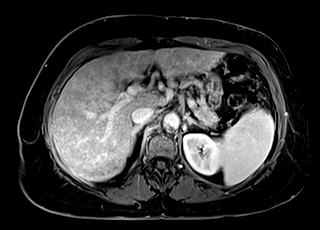
[im 80/80]
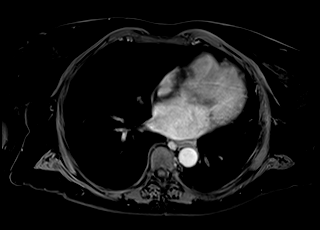

[Series 20: T1 dynamic · axial · 3.0mm · 1.19mm/px · z∈[-135,+102]mm · 3 of 80 slices shown (7 of 9)]
[im 1/80]
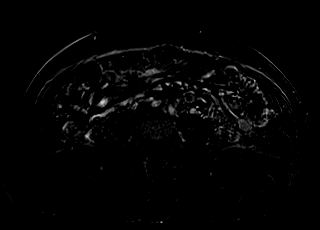
[im 40/80]
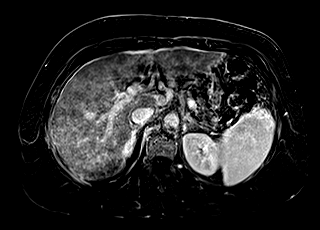
[im 80/80]
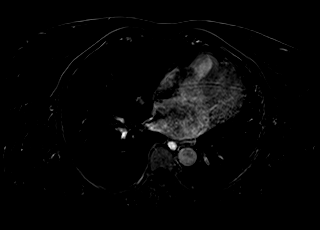

[Series 21: T1 dynamic · axial · 3.0mm · 1.19mm/px · z∈[-135,+102]mm · 3 of 80 slices shown (8 of 9)]
[im 1/80]
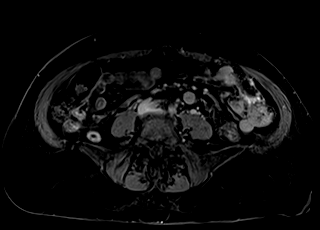
[im 40/80]
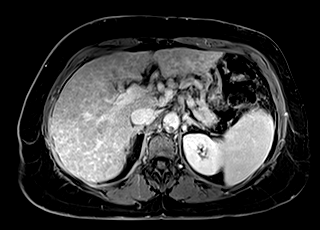
[im 80/80]
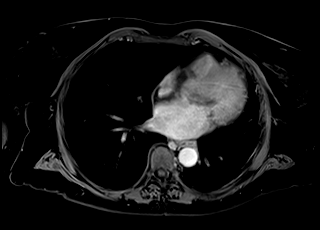

[Series 22: T1 dynamic · axial · 3.0mm · 1.19mm/px · z∈[-135,+102]mm · 3 of 80 slices shown (9 of 9)]
[im 1/80]
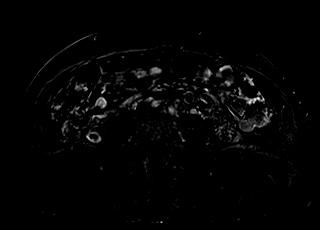
[im 40/80]
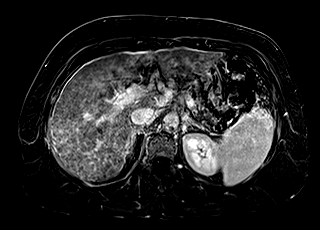
[im 80/80]
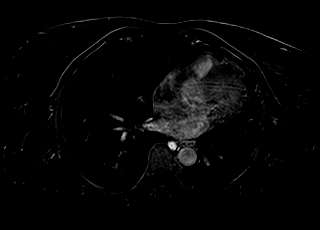

[Series 23: T1 dynamic post-contrast · coronal · 3.0mm · 1.31mm/px · 3 of 72 slices shown]
[im 1/72]
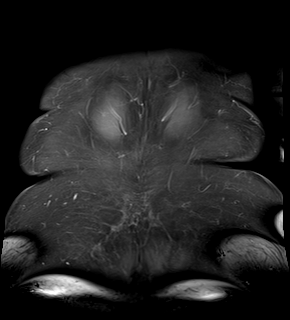
[im 36/72]
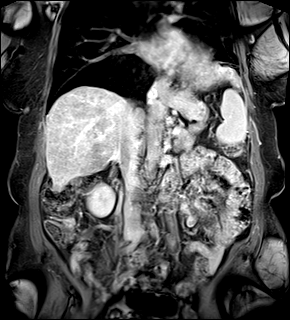
[im 72/72]
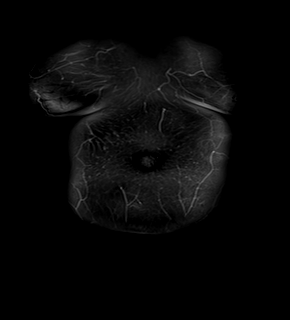

[48 of 48 positions shown; findings below may reference images not displayed]

FINDINGS: Lower chest: Left hemidiaphragm elevation. Mild cardiomegaly,
without pericardial or pleural effusion. Paraesophageal varices.

Hepatobiliary: Moderate cirrhosis. Again identified is a T2
hypointense 5.9 x 3.9 cm segment 7 and 8 hepatic mass which is
unchanged over multiple prior exams, likely a regenerative nodule

Three hepatic observations are again identified.:

-the site of known hepatocellular carcinoma, within the subcapsular
portion of segments 5 and 6 measures 2.0 x 1.8 cm and 54/15. Compare
2.1 x 1.5 cm on the prior exam. This demonstrates arterial phase
hyperenhancement and subtle central washout including on 49/22. LR
5.

-a 7 mm focus of arterial phase hyperenhancement within the high
right hepatic lobe (segment 7) on image 29/15 is similar to the
prior exam (when remeasured). No washout on delayed images. LR 3

Within the central right hepatic lobe, an arterial phase focus of
hyperenhancement measures 12 mm and 49/15 and is similar to on the
prior exam (when remeasured). This also demonstrates no washout on
delayed images. LR 3

No new liver lesion identified. Normal gallbladder, without biliary
ductal dilatation.

Pancreas:  Normal, without mass or ductal dilatation.

Spleen:  Borderline splenomegaly at 12.7 cm craniocaudal.

Adrenals/Urinary Tract: Normal adrenal glands. Normal kidneys,
without hydronephrosis.

Stomach/Bowel: Proximal gastric underdistention. Colonic stool
burden suggests constipation. Normal small bowel caliber.

Vascular/Lymphatic: Aortic atherosclerosis. No splenic artery
aneurysm. Portal venous hypertension, including paraesophageal
varices.

No abdominal adenopathy.

Other:  No ascites.

Musculoskeletal: No acute osseous abnormality.
IMPRESSION: 1. Right hepatic lobe known hepatocellular carcinoma, without
evidence of metastatic disease.
2. Similar size of 2 right hepatic lobe foci of arterial phase
hyperenhancement which are considered LR 3. No new lesions.
3. Cirrhosis and portal venous hypertension.
4.  Possible constipation.
5.  Aortic Atherosclerosis ([id number].E).

## 2024-01-25 NOTE — Progress Notes (Signed)
 [facility] [address], [place] [address]    Clinic Day:  01/25/2024  Referring physician: Omie Bickers, MD  Patient Care Team: Omie Bickers, MD as PCP - General (Internal Medicine) Riley Cheadle Windsor Hatcher, MD as Consulting Physician (Gastroenterology)   ASSESSMENT & PLAN:   Assessment: 1.  Leukopenia and thrombocytopenia: - Patient seen at the request of Dr. Quentin Brunner office. - CBC on 02/15/2021 with white count 2.4, ANC 1.3, platelet count 84.  Hemoglobin was normal at 15.1.  There was question of aggregation of platelets. - She has mildly decreased platelet count since 2016. - White count is decreased since 2018. - No B symptoms or recurrent infections. 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 - Laparoscopic gastric sleeve resection in April 2016 - MRI of the abdomen on 09/12/2019 with hepatic steatosis, cirrhosis with stigmata of portal venous hypertension including borderline splenomegaly and varices.  2.  Social/family history: - She lives at home with her husband. - She smoked 1 and half pack per day for 40 years and quit in 2012. - She is currently working at [employer] daily.  She worked for a [employer] prior to that.  No history of chemical exposure. - No family history of low platelets.  Mother died of lung cancer.  Maternal aunt died of cancer.   3.  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 - Evaluated by transplant team on 10/11/2021 not a candidate for transplant. - Microwave ablation on 11/03/2021 by Dr. Julietta Ogren.  Plan: 1.  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 - MRI of the abdomen on 01/01/2024 shows spleen size is 14.2 cm in CC length.  Previously 11.7 cm. - Reviewed labs from 01/01/2024: WBC 1.6, ANC of 0.9.  Platelet count 81,000 and stable.  Hemoglobin is normal.  B12, MMA, folic acid  were normal. - She has lost 20 pounds over the last 6 months.  She stopped eating  meats and only eating vegetarian.  Denies any fevers or night sweats.  No infections reported.  She had laparoscopic gastric sleeve resection in 2016. - Recommend boost/Ensure daily. - Recommend follow-up in 6 months with repeat labs.  If there is any worsening in cytopenias, may consider bone marrow aspiration and biopsy.  2.  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 - Reviewed MRI abdomen from 01/01/2024: No evidence of recurrence.  Stable appearance of ablation defect peripherally in the inferior right hepatic lobe.  She will continue follow-up with Dr. Julietta Ogren.  Orders Placed This Encounter  Procedures   CBC with Differential    Standing Status:   Future    Expected Date:   06/24/2024    Expiration Date:   01/24/2025   Comprehensive metabolic panel    Standing Status:   Future    Expected Date:   06/24/2024    Expiration Date:   01/24/2025   Lactate dehydrogenase    Standing Status:   Future    Expected Date:   06/24/2024    Expiration Date:   01/24/2025   AFP tumor marker    Standing Status:   Future    Expected Date:   06/24/2024    Expiration Date:   01/24/2025      Hurman Maiden R Teague,acting as a scribe for Paulett Boros, MD.,have documented all relevant documentation on the behalf of Paulett Boros, MD,as directed by  Paulett Boros, MD while  in the presence of Paulett Boros, MD.  I, Paulett Boros MD, have reviewed the above documentation for accuracy and completeness, and I agree with the above.    Paulett Boros, MD   5/29/20254:53 PM  CHIEF COMPLAINT:   Diagnosis: leukopenia and thrombocytopenia     Cancer Staging  No matching staging information was found for the patient.    Prior Therapy: none  Current Therapy:  surveillance    HISTORY OF PRESENT ILLNESS:   Oncology History   No history exists.     INTERVAL HISTORY:   Raven Lee is a 73 y.o. female presenting to clinic today for follow up of  liver cancer, leukopenia, and thrombocytopenia. She was last seen by me on 10/26/22.  Since her last visit, she had MRI of the abdomen on 01/01/24 that found: Chronic liver disease identified with a nodular heterogeneous liver. Associated areas of interstitial fibrosis and parenchymal distortion. Increasing splenic size now approaching 14.2 cm. Left-sided varices including along the esophagus and upper stomach. Trace ascites. Patent main portal vein with diameter at the liver hilum approaching 11 mm. Stable appearance of ablation defect peripherally in the inferior right hepatic lobe. No recurrent hyperenhancement along the procedures site. Subtle areas of enhancement in the arterial phase in segment 4 and 7 are stable from previous and do not have more aggressive features such as washout or T2 signal. No restricted diffusion. Liver rads category 3. No new hypervascular lesions identified at this time. Elevation left hemidiaphragm.  Today, she states that she is doing well overall. Her appetite level is at 50%. Her energy level is at 50%. Raven Lee has become a vegetarian as she has lost the appetite for meat, though she notes a normal appetite. She has unintentionally lost 20 pounds in the last 6 months and her diet mostly consists of salads. Raven Lee has a history of gastric sleeve resection in 2016. She denies any fevers, infections, or night sweats in the last 6 months.   Raven Lee has an appointment with GI on 02/06/24. She has a mammogram and bone density scan scheduled for tomorrow.   PAST MEDICAL HISTORY:   Past Medical History: Past Medical History:  Diagnosis Date   Anxiety    Difficulty sleeping    Emphysema/COPD (HCC)    Hepatitis    HEPATITIS C   History of panic attacks    Hypertension    Liver lesion    Pneumonia    Psoriasis     Surgical History: Past Surgical History:  Procedure Laterality Date   ABDOMINAL HYSTERECTOMY     COLONOSCOPY  2009   [facility]: diverticulosis   IR RADIOLOGIST  EVAL & MGMT  09/01/2021   IR RADIOLOGIST EVAL & MGMT  02/08/2022   IR RADIOLOGIST EVAL & MGMT  06/07/2022   LAPAROSCOPIC GASTRIC SLEEVE RESECTION N/A 12/01/2014   Procedure: LAPAROSCOPIC GASTRIC SLEEVE RESECTION UPPER ENDO;  Surgeon: Ayesha Lente, MD;  Location: [facility];  Service: General;  Laterality: N/A;   LUNG LOBECTOMY  03/2011   LLL-severe PNA   RADIOFREQUENCY ABLATION N/A 10/13/2021   Procedure: CT MICROWAVE ABLATION;  Surgeon: Elene Griffes, MD;  Location: [facility];  Service: Anesthesiology;  Laterality: N/A;   RADIOFREQUENCY ABLATION N/A 11/03/2021   Procedure: CT MICROWAVE ABLATION;  Surgeon: Elene Griffes, MD;  Location: [facility];  Service: Anesthesiology;  Laterality: N/A;   WISDOM TOOTH EXTRACTION      Social History: Social History   Socioeconomic History   Marital status: Married    Spouse name: Aimee Houseman  Tellis Feathers   Number of children: Not on file   Years of education: 12   Highest education level: 12th grade  Occupational History   Occupation: Retired    Associate Professor: [employer]  Tobacco Use   Smoking status: Former    Current packs/day: 0.00    Average packs/day: 1 pack/day for 25.0 years (25.0 ttl pk-yrs)    Types: Cigarettes    Start date: 03/29/1986    Quit date: 03/30/2011    Years since quitting: 12.8    Passive exposure: Past   Smokeless tobacco: Never  Vaping Use   Vaping status: Never Used  Substance and Sexual Activity   Alcohol use: Yes    Alcohol/week: 0.0 standard drinks of alcohol    Comment: socially   Drug use: No   Sexual activity: Not Currently    Partners: Male    Birth control/protection: Post-menopausal  Other Topics Concern   Not on file  Social History Narrative   Not on file   Social Drivers of Health   Financial Resource Strain: Low Risk  (04/21/2022)   Overall Financial Resource Strain (CARDIA)    Difficulty of Paying Living Expenses: Not hard at all  Food Insecurity: No Food Insecurity (04/21/2022)   Hunger Vital Sign    Worried About  Running Out of Food in the Last Year: Never true    Ran Out of Food in the Last Year: Never true  Transportation Needs: No Transportation Needs (04/21/2022)   PRAPARE - Administrator, Civil Service (Medical): No    Lack of Transportation (Non-Medical): No  Physical Activity: Inactive (04/21/2022)   Exercise Vital Sign    Days of Exercise per Week: 0 days    Minutes of Exercise per Session: 0 min  Stress: No Stress Concern Present (04/21/2022)   [employer] of Occupational Health - Occupational Stress Questionnaire    Feeling of Stress : Only a little  Social Connections: Moderately Integrated (04/21/2022)   Social Connection and Isolation Panel [NHANES]    Frequency of Communication with Friends and Family: More than three times a week    Frequency of Social Gatherings with Friends and Family: More than three times a week    Attends Religious Services: Never    Database administrator or Organizations: Yes    Attends Engineer, structural: More than 4 times per year    Marital Status: Married  Catering manager Violence: Not At Risk (04/21/2022)   Humiliation, Afraid, Rape, and Kick questionnaire    Fear of Current or Ex-Partner: No    Emotionally Abused: No    Physically Abused: No    Sexually Abused: No    Family History: Family History  Problem Relation Age of Onset   Lung cancer Mother        smoked   Heart disease Father    Heart attack Brother    Diabetes Brother    Stroke Brother    Obesity Other    Colon cancer Neg Hx    Liver disease Neg Hx     Current Medications:  Current Outpatient Medications:    Azelastine HCl 137 MCG/SPRAY SOLN, SPRAY 2 SPRAYS BY INTRANASAL ROUTE TWICE A DAY FOR 30 DAYS, Disp: , Rfl:    hydrochlorothiazide  (HYDRODIURIL ) 25 MG tablet, Take 25 mg by mouth every evening. , Disp: , Rfl:    metoprolol  (LOPRESSOR ) 50 MG tablet, Take 50 mg by mouth daily., Disp: , Rfl:    nicotine polacrilex (COMMIT) 2  MG lozenge, Take 2 mg  by mouth as needed (stress)., Disp: , Rfl:    omeprazole (PRILOSEC) 20 MG capsule, Take 20 mg by mouth daily., Disp: , Rfl:    quinapril  (ACCUPRIL ) 40 MG tablet, Take 40 mg by mouth every evening. , Disp: , Rfl:    venlafaxine  XR (EFFEXOR -XR) 150 MG 24 hr capsule, Take 150 mg by mouth every evening. , Disp: , Rfl:    traZODone (DESYREL) 100 MG tablet, Take 200 mg by mouth at bedtime as needed., Disp: , Rfl:    Allergies: Allergies  Allergen Reactions   Oxycodone Other (See Comments)    Hallucinations    REVIEW OF SYSTEMS:   Review of Systems  Constitutional:  Positive for unexpected weight change. Negative for chills, fatigue and fever.  HENT:   Negative for lump/mass, mouth sores, nosebleeds, sore throat and trouble swallowing.   Eyes:  Negative for eye problems.  Respiratory:  Negative for cough and shortness of breath.   Cardiovascular:  Negative for chest pain, leg swelling and palpitations.  Gastrointestinal:  Negative for abdominal pain, constipation, diarrhea, nausea and vomiting.  Genitourinary:  Negative for bladder incontinence, difficulty urinating, dysuria, frequency, hematuria and nocturia.   Musculoskeletal:  Negative for arthralgias, back pain, flank pain, myalgias and neck pain.  Skin:  Negative for itching and rash.  Neurological:  Negative for dizziness, headaches and numbness.  Hematological:  Does not bruise/bleed easily.  Psychiatric/Behavioral:  Positive for sleep disturbance. Negative for depression and suicidal ideas. The patient is nervous/anxious.   All other systems reviewed and are negative.    VITALS:   Blood pressure 139/84, pulse (!) 102, temperature 97.9 F (36.6 C), temperature source Tympanic, resp. rate 19, height 5' 1.5" (1.562 m), weight 140 lb (63.5 kg), SpO2 100%.  Wt Readings from Last 3 Encounters:  01/25/24 140 lb (63.5 kg)  07/03/23 160 lb 12.8 oz (72.9 kg)  10/26/22 169 lb 4.8 oz (76.8 kg)    Body mass index is 26.02  kg/m.  Performance status (ECOG): 1 - Symptomatic but completely ambulatory  PHYSICAL EXAM:   Physical Exam Vitals and nursing note reviewed. Exam conducted with a chaperone present.  Constitutional:      Appearance: Normal appearance.  Cardiovascular:     Rate and Rhythm: Normal rate and regular rhythm.     Pulses: Normal pulses.     Heart sounds: Normal heart sounds.  Pulmonary:     Effort: Pulmonary effort is normal.     Breath sounds: Normal breath sounds.  Abdominal:     Palpations: Abdomen is soft. There is no hepatomegaly, splenomegaly or mass.     Tenderness: There is no abdominal tenderness.  Musculoskeletal:     Right lower leg: No edema.     Left lower leg: No edema.  Lymphadenopathy:     Cervical: No cervical adenopathy.     Right cervical: No superficial, deep or posterior cervical adenopathy.    Left cervical: No superficial, deep or posterior cervical adenopathy.     Upper Body:     Right upper body: No supraclavicular or axillary adenopathy.     Left upper body: No supraclavicular or axillary adenopathy.  Neurological:     General: No focal deficit present.     Mental Status: She is alert and oriented to person, place, and time.  Psychiatric:        Mood and Affect: Mood normal.        Behavior: Behavior normal.  LABS:      Latest Ref Rng & Units 01/01/2024    1:20 PM 07/03/2023    3:30 PM 04/27/2023    9:53 AM  CBC  WBC 4.0 - 10.5 K/uL 1.6  2.0  2.5   Hemoglobin 12.0 - 15.0 g/dL 16.1  09.6  04.5   Hematocrit 36.0 - 46.0 % 42.0  43.6  41.1   Platelets 150 - 400 K/uL 81  100  101       Latest Ref Rng & Units 01/01/2024    1:20 PM 04/27/2023    9:53 AM 10/24/2022   11:06 AM  CMP  Glucose 70 - 99 mg/dL 99  409  811   BUN 8 - 23 mg/dL 9  14  12    Creatinine 0.44 - 1.00 mg/dL 9.14  7.82  9.56   Sodium 135 - 145 mmol/L 137  138  141   Potassium 3.5 - 5.1 mmol/L 3.6  3.3  3.7   Chloride 98 - 111 mmol/L 97  102  102   CO2 22 - 32 mmol/L 28  28  28     Calcium 8.9 - 10.3 mg/dL 9.4  8.9  8.6   Total Protein 6.5 - 8.1 g/dL 8.1  7.5  7.9   Total Bilirubin 0.0 - 1.2 mg/dL 1.9  1.5  1.4   Alkaline Phos 38 - 126 U/L 73  86  95   AST 15 - 41 U/L 61  47  50   ALT 0 - 44 U/L 25  21  21     Component     Latest Ref Rng 11/16/2021 03/23/2022 10/24/2022  AFP, Serum, Tumor Marker     0.0 - 9.2 ng/mL 9.3 (H)  6.8  6.0      No results found for: "CEA1", "CEA" / No results found for: "CEA1", "CEA" No results found for: "PSA1" No results found for: "OZH086" No results found for: "CAN125"  No results found for: "TOTALPROTELP", "ALBUMINELP", "A1GS", "A2GS", "BETS", "BETA2SER", "GAMS", "MSPIKE", "SPEI" Lab Results  Component Value Date   TIBC 306 11/13/2017   TIBC 326 05/11/2017   FERRITIN 131 11/13/2017   FERRITIN 112 05/11/2017   IRONPCTSAT 73 (H) 11/13/2017   IRONPCTSAT 47 05/11/2017   Lab Results  Component Value Date   LDH 247 (H) 03/23/2022   LDH 256 (H) 05/28/2021     STUDIES:   MR ABDOMEN WWO CONTRAST Result Date: 01/09/2024 CLINICAL DATA:  Has are carcinoma. Status post microwave ablation. Follow-up. AP 5.9 on 01/01/2024, similar to older measurements EXAM: MRI ABDOMEN WITHOUT AND WITH CONTRAST TECHNIQUE: Multiplanar multisequence MR imaging of the abdomen was performed both before and after the administration of intravenous contrast. CONTRAST:  7mL GADAVIST  GADOBUTROL  1 MMOL/ML IV SOLN COMPARISON:  11/16/2022 and older. FINDINGS: Lower chest: No pleural effusion along the lung bases. Slight elevation left hemidiaphragm. Hepatobiliary: Nodular heterogeneous liver identified. There are areas of lattice like enhancement on delayed imaging consistent with diffuse areas of fibrosis. There is an area of relative sparing of changes involving segment 7/8 liver towards the dome. This area is more dark on T2 relative to background liver and has more fatty sparing on out of phase imaging well liver elsewhere has significant signal dropout consistent  with a component of fatty infiltration. Again this could represent a regenerative nodule. Peripherally peripherally in the right hepatic lobe, segment 5/6 on the prior examination was an area of capsular retraction with focal heterogeneous low T1 signal lesion. Areas more isointense  on T2 precontrast with some heterogeneous components. This would be consistent with the ablation defect previously described. On the prior this measured 2.3 x 1.0 cm and today when measured in a similar fashion 2.3 by 1.1 cm. Series 15, image 55. On the prior examination in the dome in segment 7 was of small hypervascular area measuring 10 x 7 mm. Today this is seen once again best on series 13, image 32 measuring 8 by 6 mm. This area does not show any bright T2 signal and does not show relative washout. The similar lesion in segment 4A on the prior is not as well appreciated but may be smaller measuring 5 mm on series 13, image 32. Again no corroborative abnormal T2 signal, restricted diffusion or relative washout on the dynamic examination compared to background liver. No new areas of aggressive hyperenhancement liver. Patent portal vein. There are prominent varices and are identified including along the lower esophagus, upper stomach. No biliary ductal dilatation. Gallbladder is nondilated. Possible low insertion of a cystic duct along the posterior right side of the mid common duct. Please correlate with history. Pancreas: Preserved pancreatic T1 signal. No mass lesion or ductal dilatation. There is suggestion of a pancreatic divisum, normal variant. Spleen: Spleen is enlarged. Cephalocaudal length 14.2 cm. Previously more normal in size 11.7 cm. Preserved enhancement and precontrast signal. Small splenule inferiorly. Adrenals/Urinary Tract: Adrenal glands are preserved. No enhancing renal mass or collecting system dilatation. Stomach/Bowel: Stomach is nondilated. Visualized loops of small large bowel are nondilated. Normal  retrocecal appendix. Vascular/Lymphatic: Normal caliber aorta and IVC. Atherosclerotic changes along the aorta. No discrete abnormal lymph node enlargement identified in the abdomen. Other:  At most trace.  Paddock ascites. Musculoskeletal: Scattered degenerative changes along the spine. IMPRESSION: Chronic liver disease identified with a nodular heterogeneous liver. Associated areas of interstitial fibrosis and parenchymal distortion. Increasing splenic size now approaching 14.2 cm. Left-sided varices including along the esophagus and upper stomach. Trace ascites. Patent main portal vein with diameter at the liver hilum approaching 11 mm. Stable appearance of ablation defect peripherally in the inferior right hepatic lobe. No recurrent hyperenhancement along the procedures site. Subtle areas of enhancement in the arterial phase in segment 4 and 7 are stable from previous and do not have more aggressive features such as washout or T2 signal. No restricted diffusion. Liver rads category 3. No new hypervascular lesions identified at this time. Elevation left hemidiaphragm. Electronically Signed   By: Adrianna Horde M.D.   On: 01/09/2024 17:45

## 2024-01-25 NOTE — Patient Instructions (Addendum)
 [facility] at [facility] Discharge Instructions   You were seen and examined today by Dr. Cheree Cords.  He reviewed the results of your lab work which are normal/stable.   Your recent MRI shows your spleen has increased in size by 2 cm.   We will see you back in 6 months. We will repeat lab work prior to this visit.   Return as scheduled.    Thank you for choosing [facility] at [facility] to provide your oncology and hematology care.  To afford each patient quality time with our provider, please arrive at least 15 minutes before your scheduled appointment time.   If you have a lab appointment with the Cancer Center please come in thru the Main Entrance and check in at the main information desk.  You need to re-schedule your appointment should you arrive 10 or more minutes late.  We strive to give you quality time with our providers, and arriving late affects you and other patients whose appointments are after yours.  Also, if you no show three or more times for appointments you may be dismissed from the clinic at the providers discretion.     Again, thank you for choosing [facility].  Our hope is that these requests will decrease the amount of time that you wait before being seen by our physicians.       _____________________________________________________________  Should you have questions after your visit to [facility], please contact our office at [phone] and follow the prompts.  Our office hours are 8:00 a.m. and 4:30 p.m. Monday - Friday.  Please note that voicemails left after 4:00 p.m. may not be returned until the following business day.  We are closed weekends and major holidays.  You do have access to a nurse 24-7, just call the main number to the clinic [phone] and do not press any options, hold on the line and a nurse will answer the phone.    For prescription refill requests, have your  pharmacy contact our office and allow 72 hours.    Due to Covid, you will need to wear a mask upon entering the hospital. If you do not have a mask, a mask will be given to you at the Main Entrance upon arrival. For doctor visits, patients may have 1 support person age 12 or older with them. For treatment visits, patients can not have anyone with them due to social distancing guidelines and our immunocompromised population.

## 2024-01-26 ENCOUNTER — Ambulatory Visit (HOSPITAL_COMMUNITY)
Admission: RE | Admit: 2024-01-26 | Discharge: 2024-01-26 | Disposition: A | Discharge status: Home / Self Care | Source: Ambulatory Visit | Attending: Nurse Practitioner | Admitting: Nurse Practitioner

## 2024-01-26 DIAGNOSIS — Z1231 Encounter for screening mammogram for malignant neoplasm of breast: Secondary | ICD-10-CM | POA: Diagnosis not present

## 2024-01-26 DIAGNOSIS — Z780 Asymptomatic menopausal state: Secondary | ICD-10-CM | POA: Insufficient documentation
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
## 2024-02-01 ENCOUNTER — Ambulatory Visit: Admitting: Gastroenterology

## 2024-02-01 ENCOUNTER — Encounter: Payer: Self-pay | Admitting: Gastroenterology

## 2024-02-05 ENCOUNTER — Encounter: Payer: Self-pay | Admitting: Internal Medicine

## 2024-02-07 DIAGNOSIS — H353221 Exudative age-related macular degeneration, left eye, with active choroidal neovascularization: Secondary | ICD-10-CM | POA: Diagnosis not present

## 2024-03-18 ENCOUNTER — Encounter: Payer: Self-pay | Admitting: Gastroenterology
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
## 2024-03-27 ENCOUNTER — Encounter: Payer: Self-pay | Admitting: *Deleted

## 2024-03-27 ENCOUNTER — Ambulatory Visit: Admitting: Gastroenterology

## 2024-03-27 ENCOUNTER — Encounter: Payer: Self-pay | Admitting: Gastroenterology

## 2024-03-27 VITALS — BP 138/78 | HR 105 | Temp 97.6°F | Ht 61.0 in | Wt 135.0 lb

## 2024-03-27 DIAGNOSIS — R6881 Early satiety: Secondary | ICD-10-CM | POA: Diagnosis not present

## 2024-03-27 DIAGNOSIS — D696 Thrombocytopenia, unspecified: Secondary | ICD-10-CM | POA: Diagnosis not present

## 2024-03-27 DIAGNOSIS — R7989 Other specified abnormal findings of blood chemistry: Secondary | ICD-10-CM | POA: Diagnosis not present

## 2024-03-27 DIAGNOSIS — R634 Abnormal weight loss: Secondary | ICD-10-CM | POA: Diagnosis not present

## 2024-03-27 DIAGNOSIS — R161 Splenomegaly, not elsewhere classified: Secondary | ICD-10-CM

## 2024-03-27 DIAGNOSIS — Z1211 Encounter for screening for malignant neoplasm of colon: Secondary | ICD-10-CM

## 2024-03-27 DIAGNOSIS — R630 Anorexia: Secondary | ICD-10-CM

## 2024-03-27 DIAGNOSIS — K7460 Unspecified cirrhosis of liver: Secondary | ICD-10-CM

## 2024-03-27 NOTE — Patient Instructions (Addendum)
 We will get you scheduled for an upper endoscopy with Dr. Shaaron at [facility]  Please have blood work completed at [facility].  I have also ordered a Cologuard for you.  Due to cirrhosis, it is important that you avoid all alcohol.  Nutrition Recommendations:  High-protein diet from a primarily plant-based diet. Avoid red meat.  No raw or undercooked meat, seafood, or shellfish. Low-fat/cholesterol/carbohydrate diet. Limit sodium to no more than 2000 mg/day including everything that you eat and drink. Recommend at least 30 minutes of aerobic and resistance exercise 3 days/week.  Josette Centers, PA-C [facility]

## 2024-04-08 ENCOUNTER — Encounter (HOSPITAL_COMMUNITY)
Admission: RE | Admit: 2024-04-08 | Discharge: 2024-04-08 | Disposition: A | Discharge status: Home / Self Care | Source: Ambulatory Visit | Attending: Internal Medicine | Admitting: Internal Medicine

## 2024-04-08 NOTE — Pre-Procedure Instructions (Signed)
 Attempted pre-op phonecall. Left VM for her to call us  back.

## 2024-04-09 ENCOUNTER — Telehealth: Payer: Self-pay | Admitting: *Deleted

## 2024-04-09 NOTE — Telephone Encounter (Signed)
 Received message from endo patient needed to reschedule procedure for tomorrow. Did not remember making the appt.   Called pt. She has been rescheduled to 9/18. Confirmed her mailing address and will send new instructions. She stated she has this wrote down this time.

## 2024-04-09 NOTE — Pre-Procedure Instructions (Signed)
 Attempted pre-op phone call. Spoke with spouse who will have her return call when she returns home.

## 2024-04-09 NOTE — Pre-Procedure Instructions (Signed)
 Patient calls back and states; I did not make this appointment and I already have another appointment tomorrow so I wont be there for this. I asked her to call office and I also messaged office to let them know.

## 2024-04-10 DIAGNOSIS — H353221 Exudative age-related macular degeneration, left eye, with active choroidal neovascularization: Secondary | ICD-10-CM | POA: Diagnosis not present

## 2024-05-13 ENCOUNTER — Telehealth: Payer: Self-pay | Admitting: *Deleted

## 2024-05-13 NOTE — Telephone Encounter (Signed)
 Spoke with pt. She has rescheduled to 10/2 at 11:45am. Aware will mail new instructions. Message also sent to endo making aware.

## 2024-05-13 NOTE — Telephone Encounter (Signed)
-----   Message from Elveria GRADE sent at 05/13/2024  8:36 AM EDT ----- This patient LM that she wants to reschedule.  I put her in the depot.  Thanks,

## 2024-05-30 ENCOUNTER — Other Ambulatory Visit: Payer: Self-pay

## 2024-05-30 ENCOUNTER — Ambulatory Visit (HOSPITAL_COMMUNITY)
Admission: RE | Admit: 2024-05-30 | Discharge: 2024-05-30 | Disposition: A | Discharge status: Home / Self Care | Attending: Internal Medicine | Admitting: Internal Medicine

## 2024-05-30 ENCOUNTER — Ambulatory Visit (HOSPITAL_COMMUNITY): Admitting: Certified Registered"

## 2024-05-30 ENCOUNTER — Telehealth: Payer: Self-pay

## 2024-05-30 ENCOUNTER — Encounter (HOSPITAL_COMMUNITY): Payer: Self-pay | Admitting: Internal Medicine

## 2024-05-30 ENCOUNTER — Encounter (HOSPITAL_COMMUNITY)
Admission: RE | Disposition: A | Discharge status: Home / Self Care | Payer: Self-pay | Source: Home / Self Care | Attending: Internal Medicine

## 2024-05-30 DIAGNOSIS — Z87891 Personal history of nicotine dependence: Secondary | ICD-10-CM | POA: Insufficient documentation

## 2024-05-30 DIAGNOSIS — K3189 Other diseases of stomach and duodenum: Secondary | ICD-10-CM | POA: Insufficient documentation

## 2024-05-30 DIAGNOSIS — K766 Portal hypertension: Secondary | ICD-10-CM | POA: Insufficient documentation

## 2024-05-30 DIAGNOSIS — I8500 Esophageal varices without bleeding: Secondary | ICD-10-CM | POA: Diagnosis not present

## 2024-05-30 DIAGNOSIS — K7469 Other cirrhosis of liver: Secondary | ICD-10-CM | POA: Diagnosis not present

## 2024-05-30 DIAGNOSIS — J449 Chronic obstructive pulmonary disease, unspecified: Secondary | ICD-10-CM | POA: Insufficient documentation

## 2024-05-30 DIAGNOSIS — Z13810 Encounter for screening for upper gastrointestinal disorder: Secondary | ICD-10-CM | POA: Diagnosis not present

## 2024-05-30 DIAGNOSIS — F419 Anxiety disorder, unspecified: Secondary | ICD-10-CM | POA: Diagnosis not present

## 2024-05-30 DIAGNOSIS — I10 Essential (primary) hypertension: Secondary | ICD-10-CM | POA: Insufficient documentation

## 2024-05-30 DIAGNOSIS — K7460 Unspecified cirrhosis of liver: Secondary | ICD-10-CM | POA: Diagnosis not present

## 2024-05-30 DIAGNOSIS — I8510 Secondary esophageal varices without bleeding: Secondary | ICD-10-CM | POA: Insufficient documentation

## 2024-05-30 DIAGNOSIS — K2100 Gastro-esophageal reflux disease with esophagitis, without bleeding: Secondary | ICD-10-CM | POA: Insufficient documentation

## 2024-05-30 HISTORY — PX: ESOPHAGOGASTRODUODENOSCOPY: SHX5428

## 2024-05-30 SURGERY — EGD (ESOPHAGOGASTRODUODENOSCOPY)
Anesthesia: General

## 2024-05-30 MED ORDER — LACTATED RINGERS IV SOLN: INTRAVENOUS | Status: DC

## 2024-05-30 MED ORDER — CARVEDILOL 3.125 MG PO TABS: 3.1250 mg | ORAL_TABLET | Freq: Every day | ORAL | 11 refills | Status: AC

## 2024-05-30 MED ORDER — LIDOCAINE HCL (CARDIAC) PF 100 MG/5ML IV SOSY
PREFILLED_SYRINGE | INTRAVENOUS | Status: DC | PRN
  Administered 2024-05-30: 50 mg via INTRAVENOUS

## 2024-05-30 MED ORDER — PROPOFOL 10 MG/ML IV BOLUS
INTRAVENOUS | Status: DC | PRN
  Administered 2024-05-30 (×2): 100 mg via INTRAVENOUS

## 2024-05-30 MED ORDER — LACTATED RINGERS IV SOLN
INTRAVENOUS | Status: DC
Start: 2024-05-30 — End: 2024-05-30

## 2024-05-30 MED ORDER — LACTATED RINGERS IV SOLN: INTRAVENOUS | Status: DC | PRN

## 2024-05-30 MED ORDER — PANTOPRAZOLE SODIUM 40 MG PO TBEC: 40.0000 mg | DELAYED_RELEASE_TABLET | Freq: Every day | ORAL | 11 refills | Status: AC

## 2024-05-30 NOTE — Anesthesia Preprocedure Evaluation (Addendum)
 Anesthesia Evaluation  Patient identified by MRN, date of birth, ID band Patient awake    Reviewed: Allergy & Precautions, H&P , NPO status , Patient's Chart, lab work & pertinent test results, reviewed documented beta blocker date and time   Airway Mallampati: II  TM Distance: >3 FB Neck ROM: Full    Dental  (+) Edentulous Upper, Dental Advisory Given, Implants, Missing,    Pulmonary shortness of breath, COPD, former smoker   Pulmonary exam normal breath sounds clear to auscultation       Cardiovascular Exercise Tolerance: Good hypertension, Pt. on medications and Pt. on home beta blockers Normal cardiovascular exam Rhythm:Regular Rate:Normal     Neuro/Psych   Anxiety     negative neurological ROS     GI/Hepatic negative GI ROS,,,(+) Hepatitis -, CLiver lesion   Endo/Other  negative endocrine ROS    Renal/GU negative Renal ROS  negative genitourinary   Musculoskeletal negative musculoskeletal ROS (+)    Abdominal Normal abdominal exam  (+)   Peds  Hematology negative hematology ROS (+)   Anesthesia Other Findings   Reproductive/Obstetrics negative OB ROS                              Anesthesia Physical Anesthesia Plan  ASA: 3  Anesthesia Plan: General   Post-op Pain Management: Minimal or no pain anticipated   Induction: Intravenous  PONV Risk Score and Plan: Propofol  infusion  Airway Management Planned: Nasal Cannula and Natural Airway  Additional Equipment:   Intra-op Plan:   Post-operative Plan:   Informed Consent: I have reviewed the patients History and Physical, chart, labs and discussed the procedure including the risks, benefits and alternatives for the proposed anesthesia with the patient or authorized representative who has indicated his/her understanding and acceptance.     Dental advisory given  Plan Discussed with: CRNA  Anesthesia Plan Comments: (         )         Anesthesia Quick Evaluation

## 2024-05-30 NOTE — Discharge Instructions (Signed)
 EGD Discharge instructions Please read the instructions outlined below and refer to this sheet in the next few weeks. These discharge instructions provide you with general information on caring for yourself after you leave the hospital. Your doctor may also give you specific instructions. While your treatment has been planned according to the most current medical practices available, unavoidable complications occasionally occur. If you have any problems or questions after discharge, please call your doctor. ACTIVITY You may resume your regular activity but move at a slower pace for the next 24 hours.  Take frequent rest periods for the next 24 hours.  Walking will help expel (get rid of) the air and reduce the bloated feeling in your abdomen.  No driving for 24 hours (because of the anesthesia (medicine) used during the test).  You may shower.  Do not sign any important legal documents or operate any machinery for 24 hours (because of the anesthesia used during the test).  NUTRITION Drink plenty of fluids.  You may resume your normal diet.  Begin with a light meal and progress to your normal diet.  Avoid alcoholic beverages for 24 hours or as instructed by your caregiver.  MEDICATIONS You may resume your normal medications unless your caregiver tells you otherwise.  WHAT YOU CAN EXPECT TODAY You may experience abdominal discomfort such as a feeling of fullness or "gas" pains.  FOLLOW-UP Your doctor will discuss the results of your test with you.  SEEK IMMEDIATE MEDICAL ATTENTION IF ANY OF THE FOLLOWING OCCUR: Excessive nausea (feeling sick to your stomach) and/or vomiting.  Severe abdominal pain and distention (swelling).  Trouble swallowing.  Temperature over 101 F (37.8 C).  Rectal bleeding or vomiting of blood.     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  This  will  replace metoprolol  which you should stop taking.  Also stop taking omeprazole; begin Protonix 40 mg once a day before breakfast for reflux  The above new prescriptions have been called in from my office to your pharmacy  Office visit with us  in 4 to 6 weeks

## 2024-05-30 NOTE — H&P (Signed)
 @LOGO @   Gastroenterology Progress Note    Primary Care Physician:  Shona Norleen PEDLAR, MD Primary Gastroenterologist:  Dr. Shaaron  Pre-Procedure History & Physical: HPI:  Raven Lee is a 73 y.o. female here for  for screening EGD he has HCV related cirrhosis.  Portal hypertension suggested on CT here to screen for varices.  Past Medical History:  Diagnosis Date   Anxiety    Difficulty sleeping    Emphysema/COPD    Hepatitis    HEPATITIS C   History of panic attacks    Hypertension    Liver lesion    Pneumonia    Psoriasis     Past Surgical History:  Procedure Laterality Date   ABDOMINAL HYSTERECTOMY     COLONOSCOPY  2009   [facility]: diverticulosis   IR RADIOLOGIST EVAL & MGMT  09/01/2021   IR RADIOLOGIST EVAL & MGMT  02/08/2022   IR RADIOLOGIST EVAL & MGMT  06/07/2022   LAPAROSCOPIC GASTRIC SLEEVE RESECTION N/A 12/01/2014   Procedure: LAPAROSCOPIC GASTRIC SLEEVE RESECTION UPPER ENDO;  Surgeon: Morene Olives, MD;  Location: [facility];  Service: General;  Laterality: N/A;   LUNG LOBECTOMY  03/2011   LLL-severe PNA   RADIOFREQUENCY ABLATION N/A 10/13/2021   Procedure: CT MICROWAVE ABLATION;  Surgeon: Philip Cornet, MD;  Location: [facility];  Service: Anesthesiology;  Laterality: N/A;   RADIOFREQUENCY ABLATION N/A 11/03/2021   Procedure: CT MICROWAVE ABLATION;  Surgeon: Philip Cornet, MD;  Location: [facility];  Service: Anesthesiology;  Laterality: N/A;   WISDOM TOOTH EXTRACTION      Prior to Admission medications   Medication Sig Start Date End Date Taking? Authorizing Provider  Azelastine HCl 137 MCG/SPRAY SOLN SPRAY 2 SPRAYS BY INTRANASAL ROUTE TWICE A DAY FOR 30 DAYS 12/21/23  Yes [provider]  hydrochlorothiazide  (HYDRODIURIL ) 25 MG tablet Take 25 mg by mouth every evening.  08/07/13  Yes [provider]  metoprolol  (LOPRESSOR ) 50 MG tablet Take 50 mg by mouth daily. 08/07/13  Yes [provider]  nicotine polacrilex (COMMIT) 2 MG lozenge Take 2 mg by mouth  as needed (stress).   Yes [provider]  omeprazole (PRILOSEC) 20 MG capsule Take 20 mg by mouth daily. 02/22/22  Yes [provider]  quinapril  (ACCUPRIL ) 40 MG tablet Take 40 mg by mouth every evening.  07/17/13  Yes [provider]  traZODone (DESYREL) 100 MG tablet Take 200 mg by mouth at bedtime as needed.   Yes [provider]  venlafaxine  XR (EFFEXOR -XR) 150 MG 24 hr capsule Take 150 mg by mouth every evening.  08/07/13  Yes [provider]    Allergies as of 03/27/2024 - Review Complete 03/27/2024  Allergen Reaction Noted   Oxycodone Other (See Comments) 08/19/2013    Family History  Problem Relation Age of Onset   Lung cancer Mother        smoked   Heart disease Father    Heart attack Brother    Diabetes Brother    Stroke Brother    Obesity Other    Colon cancer Neg Hx    Liver disease Neg Hx     Social History   Socioeconomic History   Marital status: Married    Spouse name: Tyia Binford   Number of children: Not on file   Years of education: 12   Highest education level: 12th grade  Occupational History   Occupation: Retired    Associate Professor: [employer]  Tobacco Use   Smoking status: Former  Current packs/day: 0.00    Average packs/day: 1 pack/day for 25.0 years (25.0 ttl pk-yrs)    Types: Cigarettes    Start date: 03/29/1986    Quit date: 03/30/2011    Years since quitting: 13.1    Passive exposure: Past   Smokeless tobacco: Never  Vaping Use   Vaping status: Never Used  Substance and Sexual Activity   Alcohol use: Yes    Alcohol/week: 0.0 standard drinks of alcohol    Comment: socially   Drug use: No   Sexual activity: Not Currently    Partners: Male    Birth control/protection: Post-menopausal  Other Topics Concern   Not on file  Social History Narrative   Not on file   Social Drivers of Health   Financial Resource Strain: Low Risk  (04/21/2022)   Overall Financial Resource Strain (CARDIA)     Difficulty of Paying Living Expenses: Not hard at all  Food Insecurity: No Food Insecurity (04/21/2022)   Hunger Vital Sign    Worried About Running Out of Food in the Last Year: Never true    Ran Out of Food in the Last Year: Never true  Transportation Needs: No Transportation Needs (04/21/2022)   PRAPARE - Administrator, Civil Service (Medical): No    Lack of Transportation (Non-Medical): No  Physical Activity: Inactive (04/21/2022)   Exercise Vital Sign    Days of Exercise per Week: 0 days    Minutes of Exercise per Session: 0 min  Stress: No Stress Concern Present (04/21/2022)   [employer] of Occupational Health - Occupational Stress Questionnaire    Feeling of Stress : Only a little  Social Connections: Moderately Integrated (04/21/2022)   Social Connection and Isolation Panel    Frequency of Communication with Friends and Family: More than three times a week    Frequency of Social Gatherings with Friends and Family: More than three times a week    Attends Religious Services: Never    Database administrator or Organizations: Yes    Attends Engineer, structural: More than 4 times per year    Marital Status: Married  Catering manager Violence: Not At Risk (04/21/2022)   Humiliation, Afraid, Rape, and Kick questionnaire    Fear of Current or Ex-Partner: No    Emotionally Abused: No    Physically Abused: No    Sexually Abused: No    Review of Systems   See HPI, otherwise negative ROS  Physical Exam: BP (!) 167/76   Pulse 96   Temp 98.7 F (37.1 C) (Oral)   Resp 16   Ht 5' 1 (1.549 m)   Wt 60.8 kg   SpO2 96%   BMI 25.32 kg/m  General:   Alert,  Well-developed, well-nourished, pleasant and cooperative in NAD Neck:  Supple; no masses or thyromegaly. No significant cervical adenopathy. Lungs:  Clear throughout to auscultation.   No wheezes, crackles, or rhonchi. No acute distress. Heart:  Regular rate and rhythm; no murmurs, clicks, rubs,  or  gallops. Abdomen: Non-distended, normal bowel sounds.  Soft and nontender without appreciable mass or hepatosplenomegaly.    Impression/Plan:      73 year old lady with HCV related cirrhosis here for screening EGD. The risks, benefits, limitations, alternatives and imponderables have been reviewed with the patient. Potential for esophageal dilation, biopsy, etc. have also been reviewed.  Questions have been answered. All parties agreeable.    Notice: This dictation was prepared with Dragon dictation along with smaller phrase  technology. Any transcriptional errors that result from this process are unintentional and may not be corrected upon review.

## 2024-05-30 NOTE — Op Note (Signed)
 [facility] Patient Name: Raven Lee Procedure Date: 05/30/2024 11:15 AM MRN: [id number] Date of Birth: 02/07/1951 Attending MD: Lamar Ozell Hollingshead , MD, [id number] CSN: [id number] Age: 73 Admit Type: Outpatient Procedure:                Upper GI endoscopy Indications:              Cirrhosis with suspected esophageal varices Providers:                Lamar Ozell Hollingshead, MD, Jon LABOR. Gerome RN, RN,                            Italy Wilson, Technician Referring MD:              Medicines:                Propofol  per Anesthesia Complications:            No immediate complications. Estimated Blood Loss:     Estimated blood loss: none. Procedure:                Pre-Anesthesia Assessment:                           - Prior to the procedure, a History and Physical                            was performed, and patient medications and                            allergies were reviewed. The patient's tolerance of                            previous anesthesia was also reviewed. The risks                            and benefits of the procedure and the sedation                            options and risks were discussed with the patient.                            All questions were answered, and informed consent                            was obtained. Prior Anticoagulants: The patient has                            taken no anticoagulant or antiplatelet agents. ASA                            Grade Assessment: III - A patient with severe                            systemic disease. After reviewing the risks and  benefits, the patient was deemed in satisfactory                            condition to undergo the procedure.                           After obtaining informed consent, the endoscope was                            passed under direct vision. Throughout the                            procedure, the patient's blood pressure, pulse, and                             oxygen saturations were monitored continuously. The                            [id number] ([id number]) Upper was introduced through                            the mouth, and advanced to the second part of                            duodenum. The upper GI endoscopy was accomplished                            without difficulty. The patient tolerated the                            procedure well. Scope In: 11:30:09 AM Scope Out: 11:33:14 AM Total Procedure Duration: 0 hours 3 minutes 5 seconds  Findings:      4 columns grade 2 esophageal varices present without bleeding stigmata       patient also had 3 long linear erosions from the GE junction extending       up to 5 cm seen.      Moderate portal hypertensive gastropathy was found in the entire       examined stomach.      The duodenal bulb and second portion of the duodenum were normal. Impression:               - Portal hypertensive gastropathy. 4 columns grade                            2 esophageal varices. moderately severe erosive                            reflux esophagitis                           - Normal duodenal bulb and second portion of the                            duodenum.                           -  No specimens collected. Moderate Sedation:      Moderate (conscious) sedation was personally administered by an       anesthesia professional. The following parameters were monitored: oxygen       saturation, heart rate, blood pressure, respiratory rate, EKG, adequacy       of pulmonary ventilation, and response to care. Recommendation:           - Patient has a contact number available for                            emergencies. The signs and symptoms of potential                            delayed complications were discussed with the                            patient. Return to normal activities tomorrow.                            Written discharge instructions were provided to the                             patient.                           - Advance diet as tolerated. stop omeprazole; begin                            Protonix 40 mg daily. Stop metoprolol  begin                            carvedilol 3.125 mg daily                           - Return to my office in 6 weeks. Procedure Code(s):        --- Professional ---                           [phone], Esophagogastroduodenoscopy, flexible,                            transoral; diagnostic, including collection of                            specimen(s) by brushing or washing, when performed                            (separate procedure) Diagnosis Code(s):        --- Professional ---                           K76.6, Portal hypertension                           K31.89, Other diseases of stomach and duodenum  K74.60, Unspecified cirrhosis of liver CPT copyright 2022 American Medical Association. All rights reserved. The codes documented in this report are preliminary and upon coder review may  be revised to meet current compliance requirements. Lamar HERO. Tudor Chandley, MD Lamar Ozell Hollingshead, MD 05/30/2024 11:42:57 AM This report has been signed electronically. Number of Addenda: 0

## 2024-05-30 NOTE — Anesthesia Postprocedure Evaluation (Signed)
 Anesthesia Post Note  Patient: Raven Lee  Procedure(s) Performed: EGD (ESOPHAGOGASTRODUODENOSCOPY)  Patient location during evaluation: Endoscopy Anesthesia Type: General Level of consciousness: awake and alert Pain management: pain level controlled Vital Signs Assessment: post-procedure vital signs reviewed and stable Respiratory status: spontaneous breathing, nonlabored ventilation and respiratory function stable Cardiovascular status: stable Anesthetic complications: no   There were no known notable events for this encounter.   Last Vitals:  Vitals:   05/30/24 1141 05/30/24 1145  BP: (!) 92/43 (!) 104/49  Pulse: 97   Resp: 19   Temp:    SpO2: 92%     Last Pain:  Vitals:   05/30/24 1136  TempSrc: Oral  PainSc: 0-No pain                 Melvin Marmo L Brylan Seubert

## 2024-05-30 NOTE — Telephone Encounter (Signed)
-----   Message from Lamar Hollingshead sent at 05/30/2024 11:34 AM EDT -----   New prescription carvedilol 3.125 mg tablet dispense 30 with 11 refills.  Take 1 daily   stop omeprazole begin Protonix 40 mg 1 tablet daily.  Dispense 30 with 11 refills   needs office visit with an app in about 4 to 6 weeks to reassess liver issues

## 2024-05-30 NOTE — Telephone Encounter (Signed)
 Rx sent to pharmacy on file.   Raven Lee: arrange f/u with app in 4 to 6 weeks to reassess liver

## 2024-05-30 NOTE — Transfer of Care (Signed)
 Immediate Anesthesia Transfer of Care Note  Patient: Raven Lee  Procedure(s) Performed: EGD (ESOPHAGOGASTRODUODENOSCOPY)  Patient Location: PACU  Anesthesia Type:General  Level of Consciousness: awake, alert , oriented, and patient cooperative  Airway & Oxygen Therapy: Patient Spontanous Breathing  Post-op Assessment: Report given to RN and Post -op Vital signs reviewed and stable  Post vital signs: Reviewed and stable  Last Vitals:  Vitals Value Taken Time  BP 128/41 05/30/24 11:36  Temp 36.9 C 05/30/24 11:36  Pulse 99 05/30/24 11:36  Resp 18 05/30/24 11:36  SpO2 93 % 05/30/24 11:36    Last Pain:  Vitals:   05/30/24 1136  TempSrc: Oral  PainSc: 0-No pain      Patients Stated Pain Goal: 2 (05/30/24 1051)  Complications: No notable events documented.

## 2024-05-31 ENCOUNTER — Encounter (HOSPITAL_COMMUNITY): Payer: Self-pay | Admitting: Internal Medicine

## 2024-07-01 ENCOUNTER — Encounter: Payer: Self-pay | Admitting: Radiology

## 2024-07-08 ENCOUNTER — Ambulatory Visit: Admitting: Gastroenterology

## 2024-07-09 ENCOUNTER — Encounter: Payer: Self-pay | Admitting: Gastroenterology

## 2024-07-09 DIAGNOSIS — F5104 Psychophysiologic insomnia: Secondary | ICD-10-CM | POA: Diagnosis not present

## 2024-07-09 DIAGNOSIS — F329 Major depressive disorder, single episode, unspecified: Secondary | ICD-10-CM | POA: Diagnosis not present

## 2024-07-30 ENCOUNTER — Inpatient Hospital Stay: Attending: Internal Medicine

## 2024-08-05 ENCOUNTER — Inpatient Hospital Stay: Admitting: Physician Assistant

## 2024-08-06 ENCOUNTER — Inpatient Hospital Stay: Admitting: Physician Assistant

## 2024-08-26 ENCOUNTER — Encounter: Payer: Self-pay | Admitting: *Deleted

## 2024-10-03 NOTE — Therapy (Incomplete)
 " OUTPATIENT PHYSICAL THERAPY LOWER EXTREMITY EVALUATION   Patient Name: Raven Lee MRN: [id number] DOB:04-Jul-1951, 74 y.o., female Today's Date: 10/03/2024  END OF SESSION:   Past Medical History:  Diagnosis Date   Anxiety    Difficulty sleeping    Emphysema/COPD    Hepatitis    HEPATITIS C   History of panic attacks    Hypertension    Liver lesion    Pneumonia    Psoriasis    Past Surgical History:  Procedure Laterality Date   ABDOMINAL HYSTERECTOMY     COLONOSCOPY  2009   [facility]: diverticulosis   ESOPHAGOGASTRODUODENOSCOPY N/A 05/30/2024   Procedure: EGD (ESOPHAGOGASTRODUODENOSCOPY);  Surgeon: Shaaron Lamar HERO, MD;  [facility];  Service: Endoscopy;  Laterality: N/A;  11:15am, asa 2   IR RADIOLOGIST EVAL & MGMT  09/01/2021   IR RADIOLOGIST EVAL & MGMT  02/08/2022   IR RADIOLOGIST EVAL & MGMT  06/07/2022   LAPAROSCOPIC GASTRIC SLEEVE RESECTION N/A 12/01/2014   Procedure: LAPAROSCOPIC GASTRIC SLEEVE RESECTION UPPER ENDO;  Surgeon: Morene Olives, MD;  Location: [facility];  Service: General;  Laterality: N/A;   LUNG LOBECTOMY  03/2011   LLL-severe PNA   RADIOFREQUENCY ABLATION N/A 10/13/2021   Procedure: CT MICROWAVE ABLATION;  Surgeon: Philip Cornet, MD;  Location: [facility];  Service: Anesthesiology;  Laterality: N/A;   RADIOFREQUENCY ABLATION N/A 11/03/2021   Procedure: CT MICROWAVE ABLATION;  Surgeon: Philip Cornet, MD;  Location: [facility];  Service: Anesthesiology;  Laterality: N/A;   WISDOM TOOTH EXTRACTION     Patient Active Problem List   Diagnosis Date Noted   Hepatocellular carcinoma (HCC) 11/03/2021   Sprain of tibiofibular ligament of right ankle 04/12/2018   Cirrhosis (HCC) 10/23/2017   Chronic hepatitis C without hepatic coma (HCC) 05/09/2017   Abnormal LFTs 05/09/2017   Morbid obesity (HCC) 12/01/2014   COPD GOLD II 07/05/2014   Dyspnea 07/04/2014    PCP: Shona Norleen PEDLAR, MD   REFERRING PROVIDER: Joeann Browning, FNP  REFERRING DIAG: [phone] (ICD-10-CM)  - History of falling  THERAPY DIAG:  No diagnosis found.  Rationale for Evaluation and Treatment: Rehabilitation  ONSET DATE: ***  SUBJECTIVE:   SUBJECTIVE STATEMENT: ***  PERTINENT HISTORY: *** PAIN:  Are you having pain? {OPRCPAIN:27236}  PRECAUTIONS: {Therapy precautions:24002}  RED FLAGS: {PT Red Flags:29287}   WEIGHT BEARING RESTRICTIONS: {Yes ***/No:24003}  FALLS:  Has patient fallen in last 6 months? {fallsyesno:27318}  LIVING ENVIRONMENT: Lives with: {OPRC lives with:25569::lives with their family} Lives in: {Lives in:25570} Stairs: {opstairs:27293} Has following equipment at home: {Assistive devices:23999}  OCCUPATION: ***  PLOF: {PLOF:24004}  PATIENT GOALS: ***  NEXT MD VISIT: ***  OBJECTIVE:  Note: Objective measures were completed at Evaluation unless otherwise noted.  DIAGNOSTIC FINDINGS:  Radiographs of the right knee do show mild medial joint space narrowing there is no acute finding.  Radiographs of the left knee show loss of joint space in the medial compartment. There is no acute finding.  PATIENT SURVEYS:  ABC scale: {:PHR,OPRCABCSCALE}   COGNITION: Overall cognitive status: {cognition:24006}     SENSATION: {sensation:27233}  EDEMA:  {edema:24020}  MUSCLE LENGTH: Hamstrings: Right *** deg; Left *** deg Debby test: Right *** deg; Left *** deg  POSTURE: {posture:25561}  PALPATION: ***  LOWER EXTREMITY ROM:  {AROM/PROM:27142} ROM Right eval Left eval  Hip flexion    Hip extension    Hip abduction    Hip adduction    Hip internal rotation    Hip external rotation  Knee flexion    Knee extension    Ankle dorsiflexion    Ankle plantarflexion    Ankle inversion    Ankle eversion     (Blank rows = not tested)  LOWER EXTREMITY MMT:  MMT Right eval Left eval  Hip flexion    Hip extension    Hip abduction    Hip adduction    Hip internal rotation    Hip external rotation    Knee flexion    Knee  extension    Ankle dorsiflexion    Ankle plantarflexion    Ankle inversion    Ankle eversion     (Blank rows = not tested)  LOWER EXTREMITY SPECIAL TESTS:  {LEspecialtests:26242}  FUNCTIONAL TESTS:  5 times sit to stand: *** 2 minute walk test: ***  GAIT: Distance walked: *** Assistive device utilized: {Assistive devices:23999} Level of assistance: {Levels of assistance:24026} Comments: ***                                                                                                                                TREATMENT DATE:  10/03/2024   Evaluation: -ROM measured, Strength assessed, HEP prescribed, pt educated on prognosis, findings, and importance of HEP compliance if given.         PATIENT EDUCATION:  Education details: Pt was educated on findings of PT evaluation, prognosis, frequency of therapy visits and rationale, attendance policy, and HEP if given.   Person educated: {Person educated:25204} Education method: {Education Method:25205} Education comprehension: {Education Comprehension:25206}  HOME EXERCISE PROGRAM: ***  ASSESSMENT:  CLINICAL IMPRESSION: Patient is a 74 y.o. female who was seen today for physical therapy evaluation and treatment for Z91.81 (ICD-10-CM) - History of falling.   Patient demonstrates increase pain in *** decreased LE strength, and impaired balance. Patient also demonstrates difficulty with ambulation during today's session with antalgic pattern, decreased stride length and velocity noted. Patient also demonstrates ***. Patient requires ***. Patient would benefit from skilled physical therapy for decreased LE pain, increased endurance with ambulation, increased LE strength/ROM, and balance for improved gait quality, return to higher level of function with ADLs, and progress towards therapy goals.   OBJECTIVE IMPAIRMENTS: {opptimpairments:25111}.   ACTIVITY LIMITATIONS: {activitylimitations:27494}  PARTICIPATION LIMITATIONS:  {participationrestrictions:25113}  PERSONAL FACTORS: {Personal factors:25162} are also affecting patient's functional outcome.   REHAB POTENTIAL: {rehabpotential:25112}  CLINICAL DECISION MAKING: {clinical decision making:25114}  EVALUATION COMPLEXITY: {Evaluation complexity:25115}   GOALS: Goals reviewed with patient? {yes/no:20286}  SHORT TERM GOALS: Target date: ***  Patient will demonstrate evidence of independence with individualized HEP and will report compliance for at least 3 days per week for optimized progression towards remaining therapy goals. Baseline: *** Goal status: {GOALSTATUS:25110}  2.  Patient will report a decrease in pain level during community ambulation by at least 2 points for improved quality of life. Baseline: *** Goal status: {GOALSTATUS:25110}     LONG TERM GOALS: Target date: ***  Pt will demonstrate a  an increase of at least 9 points on the LEFS for improved performance of community ambulation and ADL. Baseline: *** Goal status: {GOALSTATUS:25110}  2.  Pt will improve 2 MWT by *** in order to demonstrate improved functional ambulatory capacity in community setting.  Baseline: *** Goal status: {GOALSTATUS:25110}  3.  Pt will demonstrate WFL ROM (flexion and extension) in right knee, for increased mobility and maximal efficiency of gait cycle during ambulation. Baseline: *** Goal status: {GOALSTATUS:25110}  4.  Pt will demonstrate at least 4-/5 MMT for right lower extremity for increased strength during ADL and community ambulation. Baseline: *** Goal status: {GOALSTATUS:25110}  5.  Pt will improve *** by *** in order to improve *** during functional activities.. Baseline: *** Goal status: {GOALSTATUS:25110}    PLAN:  PT FREQUENCY: {rehab frequency:25116}  PT DURATION: {rehab duration:25117}  PLANNED INTERVENTIONS: {rehab planned interventions:25118::97110-Therapeutic exercises,97530- Therapeutic [phone]- Neuromuscular  re-education,97535- Self Rjmz,02859- Manual therapy,Patient/Family education}  PLAN FOR NEXT SESSION: ***   Lang Ada, PT, DPT [facility] Office: [phone] 3:23 PM, 10/03/24   "

## 2024-10-04 ENCOUNTER — Ambulatory Visit (HOSPITAL_COMMUNITY): Admitting: Occupational Therapy

## 2024-10-04 ENCOUNTER — Ambulatory Visit (HOSPITAL_COMMUNITY)

## 2024-10-28 ENCOUNTER — Ambulatory Visit (HOSPITAL_COMMUNITY): Admitting: Occupational Therapy

## 2024-10-28 ENCOUNTER — Ambulatory Visit (HOSPITAL_COMMUNITY)
